# Patient Record
Sex: Male | Born: 2000 | Race: White | Hispanic: No | Marital: Single | Smoking: Former smoker
Health system: Southern US, Community
[De-identification: ages and names within clinical notes are randomized; demographics above are authoritative.]

## PROBLEM LIST (undated history)

## (undated) ENCOUNTER — Emergency Department: Discharge status: Absent without leave | Payer: Medicare Other

## (undated) DIAGNOSIS — F840 Autistic disorder: Secondary | ICD-10-CM

## (undated) DIAGNOSIS — Q8500 Neurofibromatosis, unspecified: Secondary | ICD-10-CM

## (undated) DIAGNOSIS — A3790 Whooping cough, unspecified species without pneumonia: Secondary | ICD-10-CM

## (undated) DIAGNOSIS — F32A Depression, unspecified: Secondary | ICD-10-CM

## (undated) DIAGNOSIS — F909 Attention-deficit hyperactivity disorder, unspecified type: Secondary | ICD-10-CM

## (undated) DIAGNOSIS — F329 Major depressive disorder, single episode, unspecified: Secondary | ICD-10-CM

## (undated) DIAGNOSIS — E669 Obesity, unspecified: Secondary | ICD-10-CM

## (undated) HISTORY — PX: MRI: SHX5353

---
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
## 2018-09-01 ENCOUNTER — Encounter: Payer: Self-pay | Admitting: Emergency Medicine

## 2018-09-01 ENCOUNTER — Emergency Department
Admission: EM | Admit: 2018-09-01 | Discharge: 2018-09-02 | Disposition: A | Discharge status: Home / Self Care | Payer: Medicaid Other | Attending: Student in an Organized Health Care Education/Training Program | Admitting: Student in an Organized Health Care Education/Training Program

## 2018-09-01 ENCOUNTER — Other Ambulatory Visit: Payer: Self-pay

## 2018-09-01 DIAGNOSIS — R45851 Suicidal ideations: Secondary | ICD-10-CM | POA: Insufficient documentation

## 2018-09-01 DIAGNOSIS — Z7289 Other problems related to lifestyle: Secondary | ICD-10-CM

## 2018-09-01 DIAGNOSIS — F329 Major depressive disorder, single episode, unspecified: Secondary | ICD-10-CM | POA: Diagnosis not present

## 2018-09-01 DIAGNOSIS — F840 Autistic disorder: Secondary | ICD-10-CM | POA: Insufficient documentation

## 2018-09-01 DIAGNOSIS — Y929 Unspecified place or not applicable: Secondary | ICD-10-CM | POA: Insufficient documentation

## 2018-09-01 DIAGNOSIS — Z008 Encounter for other general examination: Secondary | ICD-10-CM | POA: Insufficient documentation

## 2018-09-01 DIAGNOSIS — Z79899 Other long term (current) drug therapy: Secondary | ICD-10-CM | POA: Diagnosis not present

## 2018-09-01 DIAGNOSIS — X781XXA Intentional self-harm by knife, initial encounter: Secondary | ICD-10-CM | POA: Diagnosis not present

## 2018-09-01 DIAGNOSIS — F39 Unspecified mood [affective] disorder: Secondary | ICD-10-CM | POA: Diagnosis present

## 2018-09-01 DIAGNOSIS — F909 Attention-deficit hyperactivity disorder, unspecified type: Secondary | ICD-10-CM | POA: Diagnosis present

## 2018-09-01 DIAGNOSIS — Y9389 Activity, other specified: Secondary | ICD-10-CM | POA: Insufficient documentation

## 2018-09-01 DIAGNOSIS — S50312A Abrasion of left elbow, initial encounter: Secondary | ICD-10-CM | POA: Diagnosis not present

## 2018-09-01 DIAGNOSIS — Y999 Unspecified external cause status: Secondary | ICD-10-CM | POA: Insufficient documentation

## 2018-09-01 DIAGNOSIS — Z915 Personal history of self-harm: Secondary | ICD-10-CM | POA: Insufficient documentation

## 2018-09-01 DIAGNOSIS — Z87891 Personal history of nicotine dependence: Secondary | ICD-10-CM | POA: Diagnosis not present

## 2018-09-01 DIAGNOSIS — I10 Essential (primary) hypertension: Secondary | ICD-10-CM | POA: Insufficient documentation

## 2018-09-01 DIAGNOSIS — S59802A Other specified injuries of left elbow, initial encounter: Secondary | ICD-10-CM | POA: Diagnosis present

## 2018-09-01 DIAGNOSIS — R4689 Other symptoms and signs involving appearance and behavior: Secondary | ICD-10-CM | POA: Diagnosis present

## 2018-09-01 LAB — CBC
HCT: 46.8 % (ref 39.0–52.0)
Hemoglobin: 15.3 g/dL (ref 13.0–17.0)
MCH: 29.7 pg (ref 26.0–34.0)
MCHC: 32.7 g/dL (ref 30.0–36.0)
MCV: 90.9 fL (ref 80.0–100.0)
Platelets: 330 10*3/uL (ref 150–400)
RBC: 5.15 MIL/uL (ref 4.22–5.81)
RDW: 13.3 % (ref 11.5–15.5)
WBC: 8.2 10*3/uL (ref 4.0–10.5)
nRBC: 0 % (ref 0.0–0.2)

## 2018-09-01 LAB — URINE DRUG SCREEN, QUALITATIVE (ARMC ONLY)
Amphetamines, Ur Screen: NOT DETECTED
Barbiturates, Ur Screen: NOT DETECTED
Benzodiazepine, Ur Scrn: NOT DETECTED
Cannabinoid 50 Ng, Ur ~~LOC~~: NOT DETECTED
Cocaine Metabolite,Ur ~~LOC~~: NOT DETECTED
MDMA (ECSTASY) UR SCREEN: NOT DETECTED
Methadone Scn, Ur: NOT DETECTED
Opiate, Ur Screen: NOT DETECTED
Phencyclidine (PCP) Ur S: NOT DETECTED
Tricyclic, Ur Screen: NOT DETECTED

## 2018-09-01 LAB — ETHANOL: Alcohol, Ethyl (B): <10 mg/dL (ref <10)

## 2018-09-01 LAB — COMPREHENSIVE METABOLIC PANEL
ALT: 19 U/L (ref 0–44)
AST: 21 U/L (ref 15–41)
Albumin: 4.6 g/dL (ref 3.5–5.0)
Alkaline Phosphatase: 71 U/L (ref 38–126)
Anion gap: 10 (ref 5–15)
BUN: 16 mg/dL (ref 6–20)
CALCIUM: 9 mg/dL (ref 8.9–10.3)
CO2: 24 mmol/L (ref 22–32)
Chloride: 102 mmol/L (ref 98–111)
Creatinine, Ser: 0.75 mg/dL (ref 0.61–1.24)
GFR calc Af Amer: >60 mL/min (ref >60)
GFR calc non Af Amer: >60 mL/min (ref >60)
Glucose, Bld: 96 mg/dL (ref 70–99)
Potassium: 3.9 mmol/L (ref 3.5–5.1)
Sodium: 136 mmol/L (ref 135–145)
Total Bilirubin: 0.5 mg/dL (ref 0.3–1.2)
Total Protein: 7.9 g/dL (ref 6.5–8.1)

## 2018-09-01 LAB — ACETAMINOPHEN LEVEL: Acetaminophen (Tylenol), Serum: <10 ug/mL — ABNORMAL LOW (ref 10–30)

## 2018-09-01 LAB — SALICYLATE LEVEL: Salicylate Lvl: <7 mg/dL (ref 2.8–30.0)

## 2018-09-01 NOTE — ED Provider Notes (Signed)
[facility] Emergency Department Provider Note    First MD Initiated Contact with Patient 09/01/18 1809     (approximate)  I have reviewed the triage vital signs and the nursing notes.   HISTORY  Chief Complaint Suicidal    HPI Connor [name] is a 18 y.o. male with the below listed past medical history previous on Tegretol, Zoloft, clonidine presents to the ER for evaluation of suicidal ideation and hopelessness.  States that today's episode where he was found by police to be cutting himself with a kitchen knife and threatening to kill himself occurred after he got into an argument with his mother.  States that their relationship is very tense and that she has been threatening to throw him out of the house.  States his been compliant with his medications.  States he has trouble controlling his anger and outbursts.  denies any hallucinations or other polysubstance abuse.    Past Medical History:  Diagnosis Date  . ADHD (attention deficit hyperactivity disorder)   . Autism   . Depressed   . Neurofibromatosis (HCC)    Type 1  . Obesity   . Pertussis    as a infant   Family History  Problem Relation Age of Onset  . Anxiety disorder Mother   . Depression Mother   . OCD Mother   . Hypertension Mother   . Cancer Maternal Aunt   . Cancer Maternal Grandmother   . Hypertension Father   . Schizophrenia Maternal Uncle    Past Surgical History:  Procedure Laterality Date  . MRI    . RADIOLOGY WITH ANESTHESIA N/A 08/13/2012   Procedure: RADIOLOGY WITH ANESTHESIA;  Surgeon: Medication Radiologist, MD;  Location: [facility] OR;  Service: Radiology;  Laterality: N/A;  MRI    Patient Active Problem List   Diagnosis Date Noted  . 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 Bipolar I disorder, most recent episode depressed with anxious distress (HCC) 08/06/2017  . ADHD (attention deficit hyperactivity disorder)  07/08/2017  . Personality disorder (HCC) 07/08/2017  . Insomnia 06/09/2017  . Aggression 01/31/2016  . 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 Autism spectrum disorder associated with known medical or genetic condition or environmental factor, requiring substantial support (level 2) 11/06/2015  . 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 Authorizing Provider  doxepin (SINEQUAN) 100 MG capsule Take 1 capsule (100 mg total) by mouth at bedtime. 08/06/17   Pucilowska, Jolanta B, MD  lurasidone (LATUDA) 80 MG TABS tablet Take 1 tablet (80 mg total) by mouth daily before supper. 08/06/17   Pucilowska, Jolanta B, MD  OLANZapine (ZYPREXA) 5 MG tablet Take 1 tablet (5 mg total) by mouth daily as needed (agitation). 08/06/17   Pucilowska, Braulio Conte B, MD  sertraline (ZOLOFT) 100 MG tablet Take 2 tablets (200 mg total) by mouth daily. 08/06/17   Pucilowska, Ellin Goodie, MD    Allergies Patient has no known allergies.    Social History Social History   Tobacco Use  . Smoking status: Former Smoker    Packs/day: 1.00    Years: 1.00    Pack years: 1.00    Types: Cigarettes    Last attempt to quit: 12/31/2014    Years since quitting: 3.6  . Smokeless tobacco: Never Used  Substance Use Topics  .  Alcohol use: No    Alcohol/week: 0.0 standard drinks    Frequency: Never  . Drug use: No    Review of Systems Patient denies headaches, rhinorrhea, blurry vision, numbness, shortness of breath, chest pain, edema, cough, abdominal pain, nausea, vomiting, diarrhea, dysuria, fevers, rashes or hallucinations unless otherwise stated above in HPI. ____________________________________________   PHYSICAL EXAM:  VITAL SIGNS: Vitals:   09/01/18 1745  BP: 137/82  Pulse: 80  Resp: 20  Temp: 98.2 F (36.8 C)  SpO2: 97%     Constitutional: Alert and oriented.  Eyes: Conjunctivae are normal.  Head: Atraumatic. Nose: No congestion/rhinnorhea. Mouth/Throat: Mucous membranes are moist.   Neck: No stridor. Painless ROM.  Cardiovascular: Normal rate, regular rhythm. Grossly normal heart sounds.  Good peripheral circulation. Respiratory: Normal respiratory effort.  No retractions. Lungs CTAB. Gastrointestinal: Soft and nontender. No distention. No abdominal bruits. No CVA tenderness. Genitourinary:  Musculoskeletal: No lower extremity tenderness nor edema.  No joint effusions. Neurologic:  Normal speech and language. No gross focal neurologic deficits are appreciated. No facial droop Skin:  Skin is warm, dry and intact.  Superficial abrasions to the left lateral elbow region Psychiatric: Mood and affect are normal. Speech and behavior are normal.  ____________________________________________   LABS (all labs ordered are listed, but only abnormal results are displayed)  Results for orders placed or performed during the hospital encounter of 09/01/18 (from the past 24 hour(s))  Comprehensive metabolic panel     Status: None   Collection Time: 09/01/18  5:52 PM  Result Value Ref Range   Sodium 136 135 - 145 mmol/L   Potassium 3.9 3.5 - 5.1 mmol/L   Chloride 102 98 - 111 mmol/L   CO2 24 22 - 32 mmol/L   Glucose, Bld 96 70 - 99 mg/dL   BUN 16 6 - 20 mg/dL   Creatinine, Ser 0.62 0.61 - 1.24 mg/dL   Calcium 9.0 8.9 - 37.6 mg/dL   Total Protein 7.9 6.5 - 8.1 g/dL   Albumin 4.6 3.5 - 5.0 g/dL   AST 21 15 - 41 U/L   ALT 19 0 - 44 U/L   Alkaline Phosphatase 71 38 - 126 U/L   Total Bilirubin 0.5 0.3 - 1.2 mg/dL   GFR calc non Af Amer >60 >60 mL/min   GFR calc Af Amer >60 >60 mL/min   Anion gap 10 5 - 15  Ethanol     Status: None   Collection Time: 09/01/18  5:52 PM  Result Value Ref Range   Alcohol, Ethyl (B) <10 <10 mg/dL  Salicylate level     Status: None   Collection Time: 09/01/18  5:52 PM  Result  Value Ref Range   Salicylate Lvl <7.0 2.8 - 30.0 mg/dL  Acetaminophen level     Status: Abnormal   Collection Time: 09/01/18  5:52 PM  Result Value Ref Range   Acetaminophen (Tylenol), Serum <10 (L) 10 - 30 ug/mL  cbc     Status: None   Collection Time: 09/01/18  5:52 PM  Result Value Ref Range   WBC 8.2 4.0 - 10.5 K/uL   RBC 5.15 4.22 - 5.81 MIL/uL   Hemoglobin 15.3 13.0 - 17.0 g/dL   HCT 28.3 15.1 - 76.1 %   MCV 90.9 80.0 - 100.0 fL   MCH 29.7 26.0 - 34.0 pg   MCHC 32.7 30.0 - 36.0 g/dL   RDW 60.7 37.1 - 06.2 %   Platelets 330 150 - 400 K/uL  nRBC 0.0 0.0 - 0.2 %   ____________________________________________ ____________________________________________  RADIOLOGY   ____________________________________________   PROCEDURES  Procedure(s) performed:  Procedures    Critical Care performed: no ____________________________________________   INITIAL IMPRESSION / ASSESSMENT AND PLAN / ED COURSE  Pertinent labs & imaging results that were available during my care of the patient were reviewed by me and considered in my medical decision making (see chart for details).   DDX: Psychosis, delirium, medication effect, noncompliance, polysubstance abuse, Si, Hi, depression   Connor [name] is a 17 y.o. who presents to the ED with for evaluation of si.  Patient has psych history of autism and self reported depression.  Laboratory testing was ordered to evaluation for underlying electrolyte derangement or signs of underlying organic pathology to explain today's presentation.  Based on history and physical and laboratory evaluation, it appears that the patient's presentation is 2/2 underlying psychiatric disorder and will require further evaluation and management by inpatient psychiatry.  Patient was made an IVC due to SI and cutting behavior.  Disposition pending psychiatric evaluation.       As part of my medical decision making, I reviewed the following data  within the electronic MEDICAL RECORD NUMBER Nursing notes reviewed and incorporated, Labs reviewed, notes from prior ED visits.   ____________________________________________   FINAL CLINICAL IMPRESSION(S) / ED DIAGNOSES  Final diagnoses:  Suicidal ideation  Deliberate self-cutting      NEW MEDICATIONS STARTED DURING THIS VISIT:  New Prescriptions   No medications on file     Note:  This document was prepared using Dragon voice recognition software and may include unintentional dictation errors.    Willy Eddy, MD 09/01/18 Paulo Fruit

## 2018-09-01 NOTE — ED Triage Notes (Signed)
Here for SI. Pt voluntary with sheriff.  Abrasions to LUE from steak knife.  Pt reports he and his mother have not been getting along and his mom has been threatening to send him to a homeless shelter.  Cooperative.

## 2018-09-01 NOTE — ED Notes (Signed)
Hourly rounding reveals patient in room. No complaints, stable, in no acute distress. Q15 minute rounds and monitoring via Security Cameras to continue. 

## 2018-09-01 NOTE — ED Notes (Signed)
Pt. Transferred to BHU from ED to room after screening for contraband. Report to include Situation, Background, Assessment and Recommendations from [facility]. Pt. Oriented to unit including Q15 minute rounds as well as the security cameras for their protection. Patient is alert and oriented, warm and dry in no acute distress. Patient denies, HI, and AVH. Patient reported SI with the plan to cut his wrist. He has superficial cut on his left forearm.  Pt. Encouraged to let me know if needs arise.

## 2018-09-01 NOTE — BH Assessment (Signed)
Assessment Note  Connor [name] is an 18 y.o. male. Connor [name] arrived to the ED by way of law enforcement.   He reports that "my mom is freaking out and she called the police. She was in a room , locked the door, and kind of panicking and stuff and threatened to take me to a homeless shelter.  I started crying and I started freaking out and yelling and stuff.  She told me to pack my stuff and I was not okay with it.  I started kicking and punching the door, and then I ran to the kitchen and tried to cut myself.  I don't know why, I just freaked out'.  He states "I don't want to go to a homeless shelter, It was unexpected". He states that he isolates himself.  "I feel like I have been a wreck lately".  He states that he is unclear about everything.  He reports having sleeping problems, and his medications are not effective. He reports a history of anxiety, and is currently "a little bit" anxious. He denied having auditory or visual hallucinations.   He denied homicidal ideation or intent.  He denied suicidal ideation or intent.  He states, "I just want a better life". 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 No one answered the call.  A message was left  Diagnosis: Depressed  Past Medical History:  Past Medical History:  Diagnosis Date  . ADHD (attention deficit hyperactivity disorder)   . Autism   . Depressed   . Neurofibromatosis (HCC)    Type 1  . Obesity   . Pertussis    as a infant    Past Surgical History:  Procedure Laterality Date  . MRI    . RADIOLOGY WITH ANESTHESIA N/A 08/13/2012   Procedure: RADIOLOGY WITH ANESTHESIA;  Surgeon: Medication Radiologist, MD;  Location: [facility] OR;  Service: Radiology;  Laterality: N/A;  MRI     Family History:  Family History  Problem Relation Age of Onset  . Anxiety disorder Mother   . Depression Mother   . OCD Mother   . Hypertension Mother    . Cancer Maternal Aunt   . Cancer Maternal Grandmother   . Hypertension Father   . Schizophrenia Maternal Uncle     Social History:  reports that he quit smoking about 3 years ago. His smoking use included cigarettes. He has a 1.00 pack-year smoking history. He has never used smokeless tobacco. He reports that he does not drink alcohol or use drugs.  Additional Social History:  Alcohol / Drug Use History of alcohol / drug use?: No history of alcohol / drug abuse  CIWA: CIWA-Ar BP: 129/76 Pulse Rate: 76 COWS:    Allergies: No Known Allergies  Home Medications: (Not in a hospital admission)   OB/GYN Status:  No LMP for male patient.  General Assessment Data Location of Assessment: [facility] ED TTS Assessment: In system Is this a Tele or Face-to-Face Assessment?: Face-to-Face Is this an Initial Assessment or a Re-assessment for this encounter?: Initial Assessment Patient Accompanied by:: N/A Language Other than English: No Living Arrangements: Other (Comment)(Private residence) What gender do you identify as?: Male Marital status: Single Living Arrangements: Parent Can pt return to current living arrangement?: Yes Admission Status: Involuntary Petitioner: ED Attending Is patient capable of signing voluntary admission?: No Referral Source: Self/Family/Friend Insurance type: Medicaid  Medical Screening Exam [facility] Walk-in ONLY) Medical  Exam completed: Yes  Crisis Care Plan Living Arrangements: Parent Legal Guardian: Other:(Self) Name of Psychiatrist: [facility] Name of Therapist: [facility]  Education Status Is patient currently in school?: No Is the patient employed, unemployed or receiving disability?: Unemployed  Risk to self with the past 6 months Suicidal Ideation: No Has patient been a risk to self within the past 6 months prior to admission? : No Suicidal Intent: No Has patient had any suicidal intent within the past 6  months prior to admission? : No Is patient at risk for suicide?: No Suicidal Plan?: No Has patient had any suicidal plan within the past 6 months prior to admission? : No Access to Means: No What has been your use of drugs/alcohol within the last 12 months?: Denied use of substances Previous Attempts/Gestures: Yes How many times?: 1 Other Self Harm Risks: denied Triggers for Past Attempts: Other (Comment)(Overwhelmed) Intentional Self Injurious Behavior: None Family Suicide History: No Recent stressful life event(s): Conflict (Comment)(fighting with mother) Persecutory voices/beliefs?: No Depression: No Depression Symptoms: (denied) Substance abuse history and/or treatment for substance abuse?: No Suicide prevention information given to non-admitted patients: Not applicable  Risk to Others within the past 6 months Homicidal Ideation: No Does patient have any lifetime risk of violence toward others beyond the six months prior to admission? : No Thoughts of Harm to Others: No Current Homicidal Intent: No Current Homicidal Plan: No Access to Homicidal Means: No Identified Victim: None identified History of harm to others?: No Assessment of Violence: None Noted Violent Behavior Description: denied Does patient have access to weapons?: Yes (Comment)(Access to kitchen knives) Criminal Charges Pending?: No Does patient have a court date: No Is patient on probation?: No  Psychosis Hallucinations: None noted Delusions: None noted  Mental Status Report Appearance/Hygiene: In scrubs Eye Contact: Fair Motor Activity: Unremarkable Speech: Logical/coherent Level of Consciousness: Alert Mood: Euthymic Affect: Appropriate to circumstance Anxiety Level: None Thought Processes: Coherent Judgement: Partial Orientation: Appropriate for developmental age Obsessive Compulsive Thoughts/Behaviors: Unable to Assess  Cognitive Functioning Concentration: Normal Memory: Recent Intact Is  patient IDD: No Insight: Fair Impulse Control: Fair Appetite: Good Have you had any weight changes? : No Change Sleep: Decreased Vegetative Symptoms: None  ADLScreening [facility] Assessment Services) Patient's cognitive ability adequate to safely complete daily activities?: Yes Patient able to express need for assistance with ADLs?: Yes Independently performs ADLs?: Yes (appropriate for developmental age)  Prior Inpatient Therapy Prior Inpatient Therapy: Yes Prior Therapy Dates: 2019 and prior Prior Therapy Facilty/Provider(s): [facility] Reason for Treatment: Anger outbursts  Prior Outpatient Therapy Prior Outpatient Therapy: Yes Prior Therapy Dates: Current Prior Therapy Facilty/Provider(s): [facility] Reason for Treatment: Unsure Does patient have an ACCT team?: No Does patient have Intensive In-House Services?  : No Does patient have [facility] services? : No Does patient have P4CC services?: No  ADL Screening (condition at time of admission) Patient's cognitive ability adequate to safely complete daily activities?: Yes Is the patient deaf or have difficulty hearing?: No Does the patient have difficulty seeing, even when wearing glasses/contacts?: No Does the patient have difficulty concentrating, remembering, or making decisions?: No Patient able to express need for assistance with ADLs?: Yes Does the patient have difficulty dressing or bathing?: No Independently performs ADLs?: Yes (appropriate for developmental age) Does the patient have difficulty walking or climbing stairs?: No Weakness of Legs: None Weakness of Arms/Hands: None  Home Assistive Devices/Equipment Home Assistive Devices/Equipment: None    Abuse/Neglect Assessment (Assessment to be  complete while patient is alone) Abuse/Neglect Assessment Can Be Completed: (Patient denied a history of abuse)     Advance Directives (For Healthcare) Does Patient Have a Medical Advance Directive?: No           Disposition:  Disposition Initial Assessment Completed for this Encounter: Yes  On Site Evaluation by:   Reviewed with Physician:    Justice Deeds 09/01/2018 9:32 PM

## 2018-09-01 NOTE — ED Notes (Addendum)
Pt dressed out into appropriate behavioral health clothing with this tech, Lisa,EDT and [employer] from [place] in the rm. Pt belongings consist of a [employer] sweatshirt, white tennis shoes, white socks, gray t-shirt, blue shorts and blue boxers. Pt calm and cooperative while dressing out. Pt has one bag of belongings.

## 2018-09-01 NOTE — ED Notes (Signed)

## 2018-09-02 DIAGNOSIS — R4689 Other symptoms and signs involving appearance and behavior: Secondary | ICD-10-CM | POA: Diagnosis not present

## 2018-09-02 MED ORDER — HYDROXYZINE HCL 25 MG PO TABS
50.0000 mg | ORAL_TABLET | Freq: Once | ORAL | Status: AC
  Administered 2018-09-02: 50 mg via ORAL
  Filled 2018-09-02: qty 2

## 2018-09-02 MED ORDER — CARBAMAZEPINE 200 MG PO TABS: 400.0000 mg | ORAL_TABLET | Freq: Every day | ORAL | Status: DC

## 2018-09-02 MED ORDER — SERTRALINE HCL 100 MG PO TABS: 200.0000 mg | ORAL_TABLET | Freq: Every day | ORAL | Status: DC

## 2018-09-02 MED ORDER — CARBAMAZEPINE 200 MG PO TABS
200.0000 mg | ORAL_TABLET | Freq: Two times a day (BID) | ORAL | Status: DC
  Administered 2018-09-02: 200 mg via ORAL
  Filled 2018-09-02: qty 1

## 2018-09-02 MED ORDER — SERTRALINE HCL 50 MG PO TABS: 150.0000 mg | ORAL_TABLET | Freq: Every day | ORAL | Status: DC

## 2018-09-02 MED ORDER — DIVALPROEX SODIUM 500 MG PO DR TAB: 1000.0000 mg | DELAYED_RELEASE_TABLET | Freq: Every day | ORAL | Status: DC

## 2018-09-02 MED ORDER — DIVALPROEX SODIUM 500 MG PO DR TAB: 500.0000 mg | DELAYED_RELEASE_TABLET | ORAL | Status: DC

## 2018-09-02 NOTE — ED Notes (Signed)
Hourly rounding reveals patient in room. No complaints, stable, in no acute distress. Q15 minute rounds and monitoring via Security Cameras to continue. 

## 2018-09-02 NOTE — ED Notes (Signed)
Food tray given 

## 2018-09-02 NOTE — BH Assessment (Signed)
Pt's mother Ranardo Mutch: [phone]) reports she will p/u pt at 3:30pm. Mom states she is currently at a doctor's appt in [place], [place].

## 2018-09-02 NOTE — ED Notes (Signed)
The patient had a relative call and demand to speak with the nurse. I told her she was in another room with a patient and she then with a stern voice demanded to speak now to the nurse. Again, I told her she was with another patient. I placed her on hold and she then hung up after about 10 seconds after being on hold.

## 2018-09-02 NOTE — ED Provider Notes (Signed)
-----------------------------------------   6:56 AM on 09/02/2018 -----------------------------------------   Blood pressure 129/76, pulse 76, temperature 98.9 F (37.2 C), temperature source Oral, resp. rate 17, height 6\' 3"  (1.905 m), weight 126.1 kg, SpO2 99 %.  The patient is calm and cooperative at this time.  There have been no acute events since the last update.  Awaiting disposition plan from Behavioral Medicine team.    Arnaldo Natal, MD 09/02/18 [phone]

## 2018-09-02 NOTE — ED Notes (Addendum)
Patient's mother, Ernie Halas [phone], called requesting to speak with psychiatrist.  Pt's mother was tearful and stating that her son is in this shape because [facility] prescribed him Clonidine and took away his calming medications.  Mother requests that patient calls her when patient phone becomes available.

## 2018-09-02 NOTE — Consult Note (Signed)
[facility] Face-to-Face Psychiatry Consult   Reason for Consult:  Aggression Referring Physician:  Dr. Roxan Hockey Patient Identification: Connor [name] MRN:  [id number] Principal Diagnosis: Aggression Patient Active Problem List   Diagnosis Date Noted  . 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 Bipolar I disorder, most recent episode depressed with anxious distress (HCC) 08/06/2017  . ADHD (attention deficit hyperactivity disorder) 07/08/2017  . Personality disorder (HCC) 07/08/2017  . Insomnia 06/09/2017  . Aggression 01/31/2016  . 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 Autism spectrum disorder associated with known medical or genetic condition or environmental factor, requiring substantial support (level 2) 11/06/2015  . 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 Clinical von Recklinghausen's disease (HCC) 11/06/2015   Patient is seen chart is reviewed..  Agree with below recommendations with addition: Patient reports that he is on probation from assault on his mother.  He just was released from jail where he stayed from August 2019 till July 20, 2018.  He reports he has 2 counts of felony and 1 misdemeanor charge.  Patient states that he has not been working since he was released from jail.  Patient has a long history of behavior control, and states he dropped out of high school in his senior year due to becoming agitated and aggressive at school.  Patient states that he has been living with his mom and says that "it pisses me off when she is scared of me.  She locked herself in her room, and I was banging on the door so I can talk to her and she called the cops."  Patient states that he believes that the medication change that he had has been disruptive to his mood.  He also notes that when he is in prison medications were changed.  He is hopeful that he can return home with prior medications back in place  so that he can control his anger.  He does not desire to go back to prison.  Patient is denying SI, HI, AVH. Mother is contracting for safety.  Patient is able to contract for safety.  Recommend reversal of involuntary commitment.  Patient has outpatient appointment with regular psychiatrist on 09/03/2018. Tegretol has been restarted.  Total Time spent with patient: 40 minutes  Subjective: " My mom and I got into a fight and she threatened to take me to a shelter."  "I get angry and started to kick her room to." Connor [name] is a 18 y.o. male patient presented to [facility] ED via law enforcement under involuntary commitment status (IVC). The patient was seen face-to-face by this provider; chart reviewed and consulted with Dr.  Roxan Hockey on 09/01/2018 due to the care of the patient. It was discussed with Dr. Roxan Hockey that the patient does not meet criteria to be admitted to the inpatient unit.  The patient denies suicidal, homicidal ideation, and self-injurious behavior. The patient admit that what he did was "stupid" and he is sorry and ashamed of his behavior. The patient is currently receiving outpatient psychiatric care with [facility] and sees Dr. Daleen Squibb.  He also has anger management counselor that he sees  Kennith Maes in [place].  On evaluation the patient is alert and oriented x4, anxious but cooperative with depressed mood due to his recent altercation with his mom.   The patient does not appear to be responding to internal or external stimuli. Neither is the patient presenting with any delusional thinking. The patient  denies any suicidal, homicidal, or self-harm ideations. The patient is not presenting with any psychotic or paranoid behaviors. During an encounter with the patient, he was able to answer questions appropriately. The patient did voice that he was recently released from jail due to him assaulting his mother.  He stated he has been 6 months in jail for assault on the  male.  Per TTS Counselor Ms. Sloane; He reports that "my mom is freaking out and she called the police. She was in a room , locked the door, and kind of panicking and stuff and threatened to take me to a homeless shelter.  I started crying and I started freaking out and yelling and stuff.  She told me to pack my stuff and I was not okay with it.  I started kicking and punching the door, and then I ran to the kitchen and tried to cut myself.  I don't know why, I just freaked out'.  He states "I don't want to go to a homeless shelter, It was unexpected". He states that he isolates himself.  "I feel like I have been a wreck lately".  He states that he is unclear about everything.  Collateral: TTS attempted to contact mother Cruiz Imperial at - [phone]). No one answered the call.  A message was left.  HPI: Per Dr. Roxan Hockey; Connor [name] is a 18 y.o. male with the below listed past medical history previous on Tegretol, Zoloft, clonidine presents to the ER for evaluation of suicidal ideation and hopelessness.  States that today's episode where he was found by police to be cutting himself with a kitchen knife and threatening to kill himself occurred after he got into an argument with his mother.  States that their relationship is very tense and that she has been threatening to throw him out of the house.  States his been compliant with his medications.  States he has trouble controlling his anger and outbursts.  denies any hallucinations or other polysubstance abuse.  Past Psychiatric History:  ADHD (attention deficit hyperactivity disorder) Autism Depressed  Risk to Self: Suicidal Ideation: No Suicidal Intent: No Is patient at risk for suicide?: No Suicidal Plan?: No Access to Means: No What has been your use of drugs/alcohol within the last 12 months?: Denied use of substances How many times?: 1 Other Self Harm Risks: denied Triggers for Past Attempts: Other  (Comment)(Overwhelmed) Intentional Self Injurious Behavior: None Risk to Others: Homicidal Ideation: No Thoughts of Harm to Others: No Current Homicidal Intent: No Current Homicidal Plan: No Access to Homicidal Means: No Identified Victim: None identified History of harm to others?: No Assessment of Violence: None Noted Violent Behavior Description: denied Does patient have access to weapons?: Yes (Comment)(Access to kitchen knives) Criminal Charges Pending?: No Does patient have a court date: No Prior Inpatient Therapy: Prior Inpatient Therapy: Yes Prior Therapy Dates: 2019 and prior Prior Therapy Facilty/Provider(s): [facility] Reason for Treatment: Anger outbursts Prior Outpatient Therapy: Prior Outpatient Therapy: Yes Prior Therapy Dates: Current Prior Therapy Facilty/Provider(s): [facility] Reason for Treatment: Unsure Does patient have an ACCT team?: No Does patient have Intensive In-House Services?  : No Does patient have [facility] services? : No Does patient have P4CC services?: No  Past Medical History:  Past Medical History:  Diagnosis Date  . ADHD (attention deficit hyperactivity disorder)   . Autism   . Depressed   . Neurofibromatosis (HCC)    Type 1  . Obesity   . Pertussis  as a infant    Past Surgical History:  Procedure Laterality Date  . MRI    . RADIOLOGY WITH ANESTHESIA N/A 08/13/2012   Procedure: RADIOLOGY WITH ANESTHESIA;  Surgeon: Medication Radiologist, MD;  Location: [facility] OR;  Service: Radiology;  Laterality: N/A;  MRI    Family History:  Family History  Problem Relation Age of Onset  . Anxiety disorder Mother   . Depression Mother   . OCD Mother   . Hypertension Mother   . Cancer Maternal Aunt   . Cancer Maternal Grandmother   . Hypertension Father   . Schizophrenia Maternal Uncle    Family Psychiatric  History:  Anxiety disorder Depression OCD  Social History:  Social History   Substance and Sexual Activity  Alcohol Use  No  . Alcohol/week: 0.0 standard drinks  . Frequency: Never     Social History   Substance and Sexual Activity  Drug Use No    Social History   Socioeconomic History  . Marital status: Single    Spouse name: Not on file  . Number of children: Not on file  . Years of education: Not on file  . Highest education level: Not on file  Occupational History  . Not on file  Social Needs  . Financial resource strain: Not on file  . Food insecurity:    Worry: Not on file    Inability: Not on file  . Transportation needs:    Medical: Not on file    Non-medical: Not on file  Tobacco Use  . Smoking status: Former Smoker    Packs/day: 1.00    Years: 1.00    Pack years: 1.00    Types: Cigarettes    Last attempt to quit: 12/31/2014    Years since quitting: 3.6  . Smokeless tobacco: Never Used  Substance and Sexual Activity  . Alcohol use: No    Alcohol/week: 0.0 standard drinks    Frequency: Never  . Drug use: No  . Sexual activity: Not Currently    Birth control/protection: None  Lifestyle  . Physical activity:    Days per week: Not on file    Minutes per session: Not on file  . Stress: Not on file  Relationships  . Social connections:    Talks on phone: Not on file    Gets together: Not on file    Attends religious service: Not on file    Active member of club or organization: Not on file    Attends meetings of clubs or organizations: Not on file    Relationship status: Not on file  Other Topics Concern  . Not on file  Social History Narrative   Dencil is a high school drop out.   He lives with his mom only. He has one sister.   He enjoys eating, sleeping, and watching tv.   Additional Social History:    Allergies:  No Known Allergies  Labs:  Results for orders placed or performed during the hospital encounter of 09/01/18 (from the past 48 hour(s))  Comprehensive metabolic panel     Status: None   Collection Time: 09/01/18  5:52 PM  Result Value Ref Range    Sodium 136 135 - 145 mmol/L   Potassium 3.9 3.5 - 5.1 mmol/L   Chloride 102 98 - 111 mmol/L   CO2 24 22 - 32 mmol/L   Glucose, Bld 96 70 - 99 mg/dL   BUN 16 6 - 20 mg/dL   Creatinine, Ser 1.19 0.61 -  1.24 mg/dL   Calcium 9.0 8.9 - 11.9 mg/dL   Total Protein 7.9 6.5 - 8.1 g/dL   Albumin 4.6 3.5 - 5.0 g/dL   AST 21 15 - 41 U/L   ALT 19 0 - 44 U/L   Alkaline Phosphatase 71 38 - 126 U/L   Total Bilirubin 0.5 0.3 - 1.2 mg/dL   GFR calc non Af Amer >60 >60 mL/min   GFR calc Af Amer >60 >60 mL/min   Anion gap 10 5 - 15    Comment: Performed at [facility], [address], [place], [place] [address]  Ethanol     Status: None   Collection Time: 09/01/18  5:52 PM  Result Value Ref Range   Alcohol, Ethyl (B) <10 <10 mg/dL    Comment: (NOTE) Lowest detectable limit for serum alcohol is 10 mg/dL. For medical purposes only. Performed at [facility], [address] [place], [place] [address]   Salicylate level     Status: None   Collection Time: 09/01/18  5:52 PM  Result Value Ref Range   Salicylate Lvl <7.0 2.8 - 30.0 mg/dL    Comment: Performed at [facility], [address] [place], [place] [address]  Acetaminophen level     Status: Abnormal   Collection Time: 09/01/18  5:52 PM  Result Value Ref Range   Acetaminophen (Tylenol), Serum <10 (L) 10 - 30 ug/mL    Comment: (NOTE) Therapeutic concentrations vary significantly. A range of 10-30 ug/mL  may be an effective concentration for many patients. However, some  are best treated at concentrations outside of this range. Acetaminophen concentrations >150 ug/mL at 4 hours after ingestion  and >50 ug/mL at 12 hours after ingestion are often associated with  toxic reactions. Performed at [facility], [address] [place], [place] [address]   cbc     Status: None   Collection Time: 09/01/18  5:52 PM  Result Value Ref Range   WBC 8.2 4.0 - 10.5 K/uL   RBC 5.15 4.22 - 5.81 MIL/uL    Hemoglobin 15.3 13.0 - 17.0 g/dL   HCT 62.9 52.8 - 41.3 %   MCV 90.9 80.0 - 100.0 fL   MCH 29.7 26.0 - 34.0 pg   MCHC 32.7 30.0 - 36.0 g/dL   RDW 24.4 01.0 - 27.2 %   Platelets 330 150 - 400 K/uL   nRBC 0.0 0.0 - 0.2 %    Comment: Performed at [facility], [address] [place], [place] [address]  Urine Drug Screen, Qualitative     Status: None   Collection Time: 09/01/18  5:52 PM  Result Value Ref Range   Tricyclic, Ur Screen NONE DETECTED NONE DETECTED   Amphetamines, Ur Screen NONE DETECTED NONE DETECTED   MDMA (Ecstasy)Ur Screen NONE DETECTED NONE DETECTED   Cocaine Metabolite,Ur [place] NONE DETECTED NONE DETECTED   Opiate, Ur Screen NONE DETECTED NONE DETECTED   Phencyclidine (PCP) Ur S NONE DETECTED NONE DETECTED   Cannabinoid 50 Ng, Ur [place] NONE DETECTED NONE DETECTED   Barbiturates, Ur Screen NONE DETECTED NONE DETECTED   Benzodiazepine, Ur Scrn NONE DETECTED NONE DETECTED   Methadone Scn, Ur NONE DETECTED NONE DETECTED    Comment: (NOTE) Tricyclics + metabolites, urine    Cutoff 1000 ng/mL Amphetamines + metabolites, urine  Cutoff 1000 ng/mL MDMA (Ecstasy), urine              Cutoff 500 ng/mL Cocaine Metabolite, urine  Cutoff 300 ng/mL Opiate + metabolites, urine        Cutoff 300 ng/mL Phencyclidine (PCP), urine         Cutoff 25 ng/mL Cannabinoid, urine                 Cutoff 50 ng/mL Barbiturates + metabolites, urine  Cutoff 200 ng/mL Benzodiazepine, urine              Cutoff 200 ng/mL Methadone, urine                   Cutoff 300 ng/mL The urine drug screen provides only a preliminary, unconfirmed analytical test result and should not be used for non-medical purposes. Clinical consideration and professional judgment should be applied to any positive drug screen result due to possible interfering substances. A more specific alternate chemical method must be used in order to obtain a confirmed analytical result. Gas chromatography / mass spectrometry  (GC/MS) is the preferred confirmat ory method. Performed at [facility], [address] [place], [place] [address]     Current Facility-Administered Medications  Medication Dose Route Frequency Provider Last Rate Last Dose  . carbamazepine (TEGRETOL) tablet 200 mg  200 mg Oral BID Mariel CraftMaurer, Sheila M, MD      . carbamazepine (TEGRETOL) tablet 400 mg  400 mg Oral QHS Mariel CraftMaurer, Sheila M, MD      . sertraline (ZOLOFT) tablet 200 mg  200 mg Oral Daily Mariel CraftMaurer, Sheila M, MD       Current Outpatient Medications  Medication Sig Dispense Refill  . doxepin (SINEQUAN) 100 MG capsule Take 1 capsule (100 mg total) by mouth at bedtime. 30 capsule 1  . lurasidone (LATUDA) 80 MG TABS tablet Take 1 tablet (80 mg total) by mouth daily before supper. 30 tablet 1  . OLANZapine (ZYPREXA) 5 MG tablet Take 1 tablet (5 mg total) by mouth daily as needed (agitation). 10 tablet 1  . sertraline (ZOLOFT) 100 MG tablet Take 2 tablets (200 mg total) by mouth daily. 60 tablet 1    Musculoskeletal: Strength & Muscle Tone: within normal limits Gait & Station: normal Patient leans: N/A  Psychiatric Specialty Exam: Physical Exam  Constitutional: He is oriented to person, place, and time. He appears well-developed and well-nourished.  HENT:  Head: Normocephalic and atraumatic.  Right Ear: External ear normal.  Left Ear: External ear normal.  Eyes: Pupils are equal, round, and reactive to light. Conjunctivae and EOM are normal.  Neck: Normal range of motion. Neck supple.  Cardiovascular: Normal rate and regular rhythm.  Respiratory: Effort normal and breath sounds normal.  Musculoskeletal: Normal range of motion.  Neurological: He is alert and oriented to person, place, and time. He has normal reflexes.  Skin: Skin is warm and dry.    Review of Systems  Constitutional: Negative.   HENT: Negative.   Eyes: Negative.   Respiratory: Negative.   Cardiovascular: Negative.   Musculoskeletal: Negative.    Skin: Negative.   Neurological: Negative.   Endo/Heme/Allergies: Negative.   Psychiatric/Behavioral: Positive for depression. Negative for hallucinations, memory loss, substance abuse and suicidal ideas. The patient is not nervous/anxious and does not have insomnia.     Blood pressure 129/76, pulse 76, temperature 98.9 F (37.2 C), temperature source Oral, resp. rate 17, height 6\' 3"  (1.905 m), weight 126.1 kg, SpO2 99 %.Body mass index is 34.75 kg/m.  General Appearance: Fairly Groomed  Eye Contact:  Fair  Speech:  Clear and Coherent  Volume:  Normal  Mood:  Depressed  Affect:  Depressed  Thought Process:  Coherent  Orientation:  Full (Time, Place, and Person)  Thought Content:  Logical  Suicidal Thoughts:  No  Homicidal Thoughts:  No  Memory:  Recent;   Good  Judgement:  Fair  Insight:  Fair  Psychomotor Activity:  Normal  Concentration:  Concentration: Fair  Recall:  Good  Fund of Knowledge:  Fair  Language:  Good  Akathisia:  NA  Handed:  Right  AIMS (if indicated):     Assets:  Desire for Improvement Financial Resources/Insurance Housing Vocational/Educational  ADL's:  Intact  Cognition:  WNL  Sleep:   Not great without my medication     Treatment Plan Summary: Daily contact with patient to assess and evaluate symptoms and progress in treatment and Medication management  Restart Tegretol 200 mg morning and afternoon and 400 mg at night. Continue Zoloft 250 mg daily with cross taper to Pristiq.  Disposition: No evidence of imminent risk to self or others at present.   Patient does not meet criteria for psychiatric inpatient admission. Supportive therapy provided about ongoing stressors. Discussed crisis plan, support from social network, calling 911, coming to the Emergency Department, and calling Suicide Hotline.  Rescind IVC.  Patient and mother are able to contract for safety.  He was able to engage in safety planning including plan to return to nearest  emergency department or contact emergency services if he feels unable to maintain his own safety or the safety of others. Patient had no further questions, comments, or concerns. Discharge into care of mother, who agrees to maintain patient safety.       Mariel Craft, MD 09/02/2018 1:27 PM

## 2018-09-02 NOTE — ED Notes (Signed)
Pt alert and oriented x 4.  Pt denies SI, HI, and A/V hallucinations.  Contracts for safety on the unit.  Discharged to the lobby to be picked up by his mother.

## 2018-09-02 NOTE — ED Provider Notes (Signed)
Cleared for discharge by Dr. Rebecca Eaton of psychiatry   Jene Every, MD 09/02/18 1534
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
## 2018-09-02 NOTE — Progress Notes (Signed)
Per this provider previous note; Connor [name] is a 18 y.o. male patient presented to [facility] ED via law enforcement under involuntary commitment status (IVC). The patient was seen face-to-face by this provider; chart reviewed and consulted with Dr. Roxan Hockey on 09/01/2018 due to the care of the patient.It was discussed with Dr. Roxan Hockey that the patient does not meet criteria to be admitted to the inpatient unit.  Collateral: TTS attempted to contact mother Connor [name] at - [phone]). No one answered the call. A message was left.  Collateral was obtained from the patient mom this AM. She called exteremly upset this morning.  She stated that the patient seeks care at [facility].  Mom states the patient anger issues stemps from him having autism and has been very depressed for many years.  She strongly believe that clonidine 0.1 mg twice daily has caused the patient to be extremely angry.  She states that she does not want him  prescribed clonidine.  She strongly believe that the clonidine has made her son behavior what it has been. Mom wants him prescribed Tegretol 200 mg in the morning, 200 mg in the afternoon around 2 PM and 400 mg at bedtime.  She states he was prescribed Tegretol and had no issues with the medication.  Mom expressed that once the patient was taken off Tegretol he began acting very aggressive, violent and extremely angry. Mom stated 2 weeks ago to the day the patient clonidine was increased 0.1 mg twice daily.  And it has not been working well for him.  Mom wants the patient to remain on Zoloft. She said he was taking  Zoloft 200 mg daily, but currently his psychiatrist decreased his Zoloft to 150 mg daily.  Currently, he was prescribed Pristiq but unsure of the dosage. The patient mother wants him discharge due to him having an appointment with his psychiatrist on Thursday September 02, 2018.

## 2020-07-05 ENCOUNTER — Ambulatory Visit: Payer: Medicaid Other | Admitting: Gastroenterology

## 2020-07-06 ENCOUNTER — Emergency Department: Payer: Medicare Other

## 2020-07-06 ENCOUNTER — Other Ambulatory Visit: Payer: Self-pay

## 2020-07-06 DIAGNOSIS — Z87891 Personal history of nicotine dependence: Secondary | ICD-10-CM | POA: Insufficient documentation

## 2020-07-06 DIAGNOSIS — F909 Attention-deficit hyperactivity disorder, unspecified type: Secondary | ICD-10-CM | POA: Insufficient documentation

## 2020-07-06 DIAGNOSIS — F3130 Bipolar disorder, current episode depressed, mild or moderate severity, unspecified: Secondary | ICD-10-CM | POA: Insufficient documentation

## 2020-07-06 DIAGNOSIS — R0602 Shortness of breath: Secondary | ICD-10-CM | POA: Diagnosis not present

## 2020-07-06 DIAGNOSIS — Z20822 Contact with and (suspected) exposure to covid-19: Secondary | ICD-10-CM | POA: Diagnosis not present

## 2020-07-06 DIAGNOSIS — F840 Autistic disorder: Secondary | ICD-10-CM | POA: Diagnosis not present

## 2020-07-06 DIAGNOSIS — R202 Paresthesia of skin: Secondary | ICD-10-CM | POA: Insufficient documentation

## 2020-07-06 DIAGNOSIS — F4325 Adjustment disorder with mixed disturbance of emotions and conduct: Secondary | ICD-10-CM | POA: Diagnosis not present

## 2020-07-06 DIAGNOSIS — F319 Bipolar disorder, unspecified: Secondary | ICD-10-CM | POA: Diagnosis not present

## 2020-07-06 DIAGNOSIS — R0789 Other chest pain: Secondary | ICD-10-CM | POA: Diagnosis not present

## 2020-07-06 DIAGNOSIS — F39 Unspecified mood [affective] disorder: Secondary | ICD-10-CM | POA: Insufficient documentation

## 2020-07-06 DIAGNOSIS — F600 Paranoid personality disorder: Secondary | ICD-10-CM | POA: Diagnosis not present

## 2020-07-06 DIAGNOSIS — Z79899 Other long term (current) drug therapy: Secondary | ICD-10-CM | POA: Insufficient documentation

## 2020-07-06 DIAGNOSIS — R079 Chest pain, unspecified: Secondary | ICD-10-CM | POA: Diagnosis present

## 2020-07-06 LAB — BASIC METABOLIC PANEL
Anion gap: 12 (ref 5–15)
BUN: 9 mg/dL (ref 6–20)
CO2: 26 mmol/L (ref 22–32)
Calcium: 9 mg/dL (ref 8.9–10.3)
Chloride: 101 mmol/L (ref 98–111)
Creatinine, Ser: 0.87 mg/dL (ref 0.61–1.24)
GFR, Estimated: >60 mL/min (ref >60)
Glucose, Bld: 94 mg/dL (ref 70–99)
Potassium: 3.5 mmol/L (ref 3.5–5.1)
Sodium: 139 mmol/L (ref 135–145)

## 2020-07-06 LAB — CBC
HCT: 50.4 % (ref 39.0–52.0)
Hemoglobin: 16.8 g/dL (ref 13.0–17.0)
MCH: 29 pg (ref 26.0–34.0)
MCHC: 33.3 g/dL (ref 30.0–36.0)
MCV: 86.9 fL (ref 80.0–100.0)
Platelets: 322 10*3/uL (ref 150–400)
RBC: 5.8 MIL/uL (ref 4.22–5.81)
RDW: 12.8 % (ref 11.5–15.5)
WBC: 8.7 10*3/uL (ref 4.0–10.5)
nRBC: 0 % (ref 0.0–0.2)

## 2020-07-06 LAB — TROPONIN I (HIGH SENSITIVITY): Troponin I (High Sensitivity): 3 ng/L (ref <18)

## 2020-07-06 NOTE — ED Triage Notes (Signed)
Pt reports mid CP 7/10 that started a week ago. Seen 1/11 for same. Prescribed omeprazole, been taking with no relief. NAD noted. Pt states neuro diagnosis and states mom asking if we could possibly do scan of brain to see if related to tumor.

## 2020-07-07 ENCOUNTER — Emergency Department (EMERGENCY_DEPARTMENT_HOSPITAL)
Admission: EM | Admit: 2020-07-07 | Discharge: 2020-07-10 | Disposition: A | Discharge status: Home / Self Care | Payer: Medicare Other | Source: Home / Self Care | Attending: Emergency Medicine | Admitting: Emergency Medicine

## 2020-07-07 ENCOUNTER — Emergency Department: Payer: Medicare Other

## 2020-07-07 ENCOUNTER — Emergency Department
Admission: EM | Admit: 2020-07-07 | Discharge: 2020-07-07 | Disposition: A | Discharge status: Home / Self Care | Payer: Medicare Other | Attending: Student in an Organized Health Care Education/Training Program | Admitting: Student in an Organized Health Care Education/Training Program

## 2020-07-07 DIAGNOSIS — F4325 Adjustment disorder with mixed disturbance of emotions and conduct: Secondary | ICD-10-CM | POA: Insufficient documentation

## 2020-07-07 DIAGNOSIS — Z87891 Personal history of nicotine dependence: Secondary | ICD-10-CM | POA: Insufficient documentation

## 2020-07-07 DIAGNOSIS — F319 Bipolar disorder, unspecified: Secondary | ICD-10-CM

## 2020-07-07 DIAGNOSIS — F909 Attention-deficit hyperactivity disorder, unspecified type: Secondary | ICD-10-CM | POA: Insufficient documentation

## 2020-07-07 DIAGNOSIS — Q8501 Neurofibromatosis, type 1: Secondary | ICD-10-CM | POA: Diagnosis present

## 2020-07-07 DIAGNOSIS — F22 Delusional disorders: Secondary | ICD-10-CM

## 2020-07-07 DIAGNOSIS — F840 Autistic disorder: Secondary | ICD-10-CM | POA: Diagnosis present

## 2020-07-07 DIAGNOSIS — R0789 Other chest pain: Secondary | ICD-10-CM | POA: Diagnosis not present

## 2020-07-07 DIAGNOSIS — F600 Paranoid personality disorder: Secondary | ICD-10-CM | POA: Insufficient documentation

## 2020-07-07 DIAGNOSIS — F39 Unspecified mood [affective] disorder: Secondary | ICD-10-CM | POA: Insufficient documentation

## 2020-07-07 DIAGNOSIS — Z20822 Contact with and (suspected) exposure to covid-19: Secondary | ICD-10-CM | POA: Insufficient documentation

## 2020-07-07 DIAGNOSIS — F3130 Bipolar disorder, current episode depressed, mild or moderate severity, unspecified: Secondary | ICD-10-CM | POA: Insufficient documentation

## 2020-07-07 LAB — URINE DRUG SCREEN, QUALITATIVE (ARMC ONLY)
Amphetamines, Ur Screen: NOT DETECTED
Barbiturates, Ur Screen: NOT DETECTED
Benzodiazepine, Ur Scrn: NOT DETECTED
Cannabinoid 50 Ng, Ur ~~LOC~~: NOT DETECTED
Cocaine Metabolite,Ur ~~LOC~~: NOT DETECTED
MDMA (Ecstasy)Ur Screen: NOT DETECTED
Methadone Scn, Ur: NOT DETECTED
Opiate, Ur Screen: NOT DETECTED
Phencyclidine (PCP) Ur S: NOT DETECTED
Tricyclic, Ur Screen: NOT DETECTED

## 2020-07-07 LAB — TROPONIN I (HIGH SENSITIVITY): Troponin I (High Sensitivity): 4 ng/L (ref <18)

## 2020-07-07 MED ORDER — OLANZAPINE 5 MG PO TBDP
5.0000 mg | ORAL_TABLET | Freq: Once | ORAL | Status: AC
  Administered 2020-07-08: 5 mg via ORAL
  Filled 2020-07-07: qty 1

## 2020-07-07 MED ORDER — LORAZEPAM 1 MG PO TABS
1.0000 mg | ORAL_TABLET | Freq: Once | ORAL | Status: AC
  Administered 2020-07-08: 1 mg via ORAL
  Filled 2020-07-07: qty 1

## 2020-07-07 MED ORDER — IOHEXOL 350 MG/ML SOLN
75.0000 mL | Freq: Once | INTRAVENOUS | Status: AC | PRN
  Administered 2020-07-07: 75 mL via INTRAVENOUS

## 2020-07-07 NOTE — Consult Note (Signed)
[facility] Face-to-Face Psychiatry Consult   Reason for Consult:  Psych Evaluation Referring Physician:  Dr. Erma Heritage Patient Identification: Connor [name] MRN:  [id number] Principal Diagnosis: Bipolar I disorder, most recent episode depressed with anxious distress (HCC) Diagnosis:  Principal Problem:   Bipolar I disorder, most recent episode depressed with anxious distress (HCC)   Total Time spent with patient: 1 hour  Subjective:   Connor [name] is a 20 y.o. male patient admitted to [facility] per triage nurse, pt very anxious, states he thinks something is really wrong with him. Pt states "i'm not crazy, I really need someone to believe me". Pt states he has felt weak, has had blurred vision, and thinks radiation "reading on me is really high". Pt rambling is pacing in triage. Pt states he has been constipated, feels like he may have ruptured his spleen and also complains of generalized body pain.   HPI:   Connor [name], 20 y.o., male patient presented to [facility].  Patient seen face to face by TTS and this provider; chart reviewed and consulted with Dr. Lucianne Muss on 07/07/20.  On evaluation Connor [name] reports that he feels sick.  He says no one is listening to him but that he feels really ill.  He says his back, lungs, heart and kidneys feel sick. Patient has visited the ER 5 times in the past month for various complaints of physical illness.  Per TTS, Collateral information was obtained from patient's mother Connor [name] [phone]) who reports "He hasn't been well, he hasn't been following up with his GI appointments and because he hasn't been feeling well he has been on Youtube researching symptoms he thinks he has, he just lives in his room, I believe it's more mental and not physical, every day he comes up with different things he has, he will say to me "I feel like I'm dying", he also told me that he hasn't been taking his medications." Patient's  mother confirms that patient has a psychiatrist with [facility].   During evaluation Connor [name] is laying in bed; he is alert/oriented x 3; anxious/cooperative; and mood congruent with flat affect.  Patient is speaking in a clear tone at moderate volume, and normal pace; with fair eye contact.  His thought process is blocked and speech is slowed. There is no indication that he is currently responding to internal/external stimuli.  His thoughts are delusional in content.  Patient denies suicidal/self-harm/homicidal ideation, psychosis, and paranoia.    Recommendations:  Inpatient psychiatric hospitalization for medication stabilization   Past Psychiatric History: Bipolar disorder, autism  Risk to Self:   Risk to Others:   Prior Inpatient Therapy:   Prior Outpatient Therapy:    Past Medical History:  Past Medical History:  Diagnosis Date  . ADHD (attention deficit hyperactivity disorder)   . Autism   . Depressed   . Neurofibromatosis (HCC)    Type 1  . Obesity   . Pertussis    as a infant    Past Surgical History:  Procedure Laterality Date  . MRI    . RADIOLOGY WITH ANESTHESIA N/A 08/13/2012   Procedure: RADIOLOGY WITH ANESTHESIA;  Surgeon: Medication Radiologist, MD;  Location: [facility] OR;  Service: Radiology;  Laterality: N/A;  MRI    Family History:  Family History  Problem Relation Age of Onset  . Anxiety disorder Mother   . Depression Mother   . OCD Mother   . Hypertension Mother   .  Cancer Maternal Aunt   . Cancer Maternal Grandmother   . Hypertension Father   . Schizophrenia Maternal Uncle    Family Psychiatric  History: unknown Social History:  Social History   Substance and Sexual Activity  Alcohol Use No  . Alcohol/week: 0.0 standard drinks     Social History   Substance and Sexual Activity  Drug Use No    Social History   Socioeconomic History  . Marital status: Single    Spouse name: Not on file  . Number of children:  Not on file  . Years of education: Not on file  . Highest education level: Not on file  Occupational History  . Not on file  Tobacco Use  . Smoking status: Former Smoker    Packs/day: 1.00    Years: 1.00    Pack years: 1.00    Types: Cigarettes    Quit date: 12/31/2014    Years since quitting: 5.5  . Smokeless tobacco: Never Used  Substance and Sexual Activity  . Alcohol use: No    Alcohol/week: 0.0 standard drinks  . Drug use: No  . Sexual activity: Not Currently    Birth control/protection: None  Other Topics Concern  . Not on file  Social History Narrative   Levi is a high school drop out.   He lives with his mom only. He has one sister.   He enjoys eating, sleeping, and watching tv.   Social Determinants of Health   Financial Resource Strain: Not on file  Food Insecurity: Not on file  Transportation Needs: Not on file  Physical Activity: Not on file  Stress: Not on file  Social Connections: Not on file   Additional Social History:    Allergies:  No Known Allergies  Labs:  Results for orders placed or performed during the hospital encounter of 07/07/20 (from the past 48 hour(s))  Urine Drug Screen, Qualitative     Status: None   Collection Time: 07/07/20  9:40 PM  Result Value Ref Range   Tricyclic, Ur Screen NONE DETECTED NONE DETECTED   Amphetamines, Ur Screen NONE DETECTED NONE DETECTED   MDMA (Ecstasy)Ur Screen NONE DETECTED NONE DETECTED   Cocaine Metabolite,Ur [place] NONE DETECTED NONE DETECTED   Opiate, Ur Screen NONE DETECTED NONE DETECTED   Phencyclidine (PCP) Ur S NONE DETECTED NONE DETECTED   Cannabinoid 50 Ng, Ur [place] NONE DETECTED NONE DETECTED   Barbiturates, Ur Screen NONE DETECTED NONE DETECTED   Benzodiazepine, Ur Scrn NONE DETECTED NONE DETECTED   Methadone Scn, Ur NONE DETECTED NONE DETECTED    Comment: (NOTE) Tricyclics + metabolites, urine    Cutoff 1000 ng/mL Amphetamines + metabolites, urine  Cutoff 1000 ng/mL MDMA (Ecstasy), urine               Cutoff 500 ng/mL Cocaine Metabolite, urine          Cutoff 300 ng/mL Opiate + metabolites, urine        Cutoff 300 ng/mL Phencyclidine (PCP), urine         Cutoff 25 ng/mL Cannabinoid, urine                 Cutoff 50 ng/mL Barbiturates + metabolites, urine  Cutoff 200 ng/mL Benzodiazepine, urine              Cutoff 200 ng/mL Methadone, urine                   Cutoff 300 ng/mL  The urine drug screen  provides only a preliminary, unconfirmed analytical test result and should not be used for non-medical purposes. Clinical consideration and professional judgment should be applied to any positive drug screen result due to possible interfering substances. A more specific alternate chemical method must be used in order to obtain a confirmed analytical result. Gas chromatography / mass spectrometry (GC/MS) is the preferred confirm atory method. Performed at [facility], [address] [place], [place] [address]     No current facility-administered medications for this encounter.   Current Outpatient Medications  Medication Sig Dispense Refill  . omeprazole (PRILOSEC) 20 MG capsule Take 20 mg by mouth daily.    . ziprasidone (GEODON) 40 MG capsule Take 40 mg by mouth 2 (two) times daily with a meal.      Musculoskeletal: Strength & Muscle Tone: within normal limits Gait & Station: normal Patient leans: N/A  Psychiatric Specialty Exam: Physical Exam Vitals and nursing note reviewed.     Review of Systems  Psychiatric/Behavioral: Positive for decreased concentration, dysphoric mood and sleep disturbance. Negative for suicidal ideas. The patient is nervous/anxious.   All other systems reviewed and are negative.   Blood pressure (!) 165/101, pulse 98, resp. rate 16, height 6\' 3"  (1.905 m), weight 130 kg, SpO2 100 %.Body mass index is 35.82 kg/m.  General Appearance: Bizarre and Casual  Eye Contact:  Fair  Speech:  Blocked  Volume:  Decreased  Mood:  Anxious and  Dysphoric  Affect:  Flat  Thought Process:  Disorganized  Orientation:  Full (Time, Place, and Person)  Thought Content:  Obsessions  Suicidal Thoughts:  No  Homicidal Thoughts:  No  Memory:  Immediate;   Fair  Judgement:  Impaired  Insight:  Lacking  Psychomotor Activity:  Decreased  Concentration:  Attention Span: Fair  Recall:  Poor  Fund of Knowledge:  Fair  Language:  Fair  Akathisia:  NA  Handed:  Right  AIMS (if indicated):     Assets:  Social Support  ADL's:  Intact  Cognition:  WNL  Sleep:        Treatment Plan Summary: Daily contact with patient to assess and evaluate symptoms and progress in treatment and Medication management  Disposition: Recommend psychiatric Inpatient admission when medically cleared. Supportive therapy provided about ongoing stressors. Discussed crisis plan, support from social network, calling 911, coming to the Emergency Department, and calling Suicide Hotline.  Manufacturing systems engineer, NP 07/07/2020 10:53 PM

## 2020-07-07 NOTE — ED Notes (Signed)
Provided DC instructions. Verbalized understanding.  

## 2020-07-07 NOTE — ED Provider Notes (Signed)
[facility] Emergency Department Provider Note    Event Date/Time   First MD Initiated Contact with Patient 07/07/20 [phone]     (approximate)  I have reviewed the triage vital signs and the nursing notes.   HISTORY  Chief Complaint Chest Pain    HPI Connor [name] is a 20 y.o. male below listed past medical history presents to ER for evaluation of midsternal nonradiating discomfort and shortness of breath and chest pressure for the past week. No positional changes. No radiating discomfort to his jaw or back. Is never had symptoms like this before. No measured fevers. Denies any new numbness or tingling. States that we will have shooting pains in his chest. Tingling sensation is. No new medication changes. Does not feel particularly anxious. Does not feel depressed. Denies SI or HI.   Past Medical History:  Diagnosis Date  . ADHD (attention deficit hyperactivity disorder)   . Autism   . Depressed   . Neurofibromatosis (HCC)    Type 1  . Obesity   . Pertussis    as a infant   Family History  Problem Relation Age of Onset  . Anxiety disorder Mother   . Depression Mother   . OCD Mother   . Hypertension Mother   . Cancer Maternal Aunt   . Cancer Maternal Grandmother   . Hypertension Father   . Schizophrenia Maternal Uncle    Past Surgical History:  Procedure Laterality Date  . MRI    . RADIOLOGY WITH ANESTHESIA N/A 08/13/2012   Procedure: RADIOLOGY WITH ANESTHESIA;  Surgeon: Medication Radiologist, MD;  Location: [facility] OR;  Service: Radiology;  Laterality: N/A;  MRI    Patient Active Problem List   Diagnosis Date Noted  . 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 Bipolar I disorder, most recent episode depressed with anxious distress (HCC) 08/06/2017  . ADHD (attention deficit hyperactivity disorder) 07/08/2017  . Personality disorder (HCC) 07/08/2017  . Insomnia 06/09/2017  . Aggression  01/31/2016  . 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 Autism spectrum disorder associated with known medical or genetic condition or environmental factor, requiring substantial support (level 2) 11/06/2015  . 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 Authorizing Provider  doxepin (SINEQUAN) 100 MG capsule Take 1 capsule (100 mg total) by mouth at bedtime. 08/06/17   Pucilowska, Jolanta B, MD  lurasidone (LATUDA) 80 MG TABS tablet Take 1 tablet (80 mg total) by mouth daily before supper. 08/06/17   Pucilowska, Jolanta B, MD  OLANZapine (ZYPREXA) 5 MG tablet Take 1 tablet (5 mg total) by mouth daily as needed (agitation). 08/06/17   Pucilowska, Braulio Conte B, MD  sertraline (ZOLOFT) 100 MG tablet Take 2 tablets (200 mg total) by mouth daily. 08/06/17   Pucilowska, Ellin Goodie, MD    Allergies Patient has no known allergies.    Social History Social History   Tobacco Use  . Smoking status: Former Smoker    Packs/day: 1.00    Years: 1.00    Pack years: 1.00    Types: Cigarettes    Quit date: 12/31/2014    Years since quitting: 5.5  . Smokeless tobacco: Never Used  Substance Use Topics  . Alcohol use: No    Alcohol/week: 0.0 standard drinks  . Drug use: No  Review of Systems Patient denies headaches, rhinorrhea, blurry vision, numbness, shortness of breath, chest pain, edema, cough, abdominal pain, nausea, vomiting, diarrhea, dysuria, fevers, rashes or hallucinations unless otherwise stated above in HPI. ____________________________________________   PHYSICAL EXAM:  VITAL SIGNS: Vitals:   07/07/20 0344 07/07/20 0704  BP: (!) 147/98 (!) 148/93  Pulse: (!) 114 (!) 109  Resp: 18 18  Temp: 98.8 F (37.1 C)   SpO2: 97% 99%    Constitutional: Alert and oriented.  Eyes: Conjunctivae are normal.  Head:  Atraumatic. Nose: No congestion/rhinnorhea. Mouth/Throat: Mucous membranes are moist.   Neck: No stridor. Painless ROM.  Cardiovascular: Normal rate, regular rhythm. Grossly normal heart sounds.  Good peripheral circulation. Respiratory: Normal respiratory effort.  No retractions. Lungs CTAB. Gastrointestinal: Soft and nontender. No distention. No abdominal bruits. No CVA tenderness. Genitourinary:  Musculoskeletal: No lower extremity tenderness nor edema.  No joint effusions. Neurologic:  Normal speech and language. No gross focal neurologic deficits are appreciated. No facial droop Skin:  Skin is warm, dry and intact. No rash noted. Psychiatric: calm cooperative, no SI or HI.  ____________________________________________   LABS (all labs ordered are listed, but only abnormal results are displayed)  Results for orders placed or performed during the hospital encounter of 07/07/20 (from the past 24 hour(s))  Basic metabolic panel     Status: None   Collection Time: 07/06/20  8:55 PM  Result Value Ref Range   Sodium 139 135 - 145 mmol/L   Potassium 3.5 3.5 - 5.1 mmol/L   Chloride 101 98 - 111 mmol/L   CO2 26 22 - 32 mmol/L   Glucose, Bld 94 70 - 99 mg/dL   BUN 9 6 - 20 mg/dL   Creatinine, Ser 3.71 0.61 - 1.24 mg/dL   Calcium 9.0 8.9 - 06.2 mg/dL   GFR, Estimated >69 >48 mL/min   Anion gap 12 5 - 15  CBC     Status: None   Collection Time: 07/06/20  8:55 PM  Result Value Ref Range   WBC 8.7 4.0 - 10.5 K/uL   RBC 5.80 4.22 - 5.81 MIL/uL   Hemoglobin 16.8 13.0 - 17.0 g/dL   HCT 54.6 27.0 - 35.0 %   MCV 86.9 80.0 - 100.0 fL   MCH 29.0 26.0 - 34.0 pg   MCHC 33.3 30.0 - 36.0 g/dL   RDW 09.3 81.8 - 29.9 %   Platelets 322 150 - 400 K/uL   nRBC 0.0 0.0 - 0.2 %  Troponin I (High Sensitivity)     Status: None   Collection Time: 07/06/20  8:55 PM  Result Value Ref Range   Troponin I (High Sensitivity) 3 <18 ng/L  Troponin I (High Sensitivity)     Status: None   Collection Time:  07/07/20  3:51 AM  Result Value Ref Range   Troponin I (High Sensitivity) 4 <18 ng/L   ____________________________________________  EKG My review and personal interpretation at Time: 20:55   Indication: chest pain  Rate: 125  Rhythm: sinus Axis: normal Other: nonspecific st abn, no stemi ____________________________________________  RADIOLOGY  I personally reviewed all radiographic images ordered to evaluate for the above acute complaints and reviewed radiology reports and findings.  These findings were personally discussed with the patient.  Please see medical record for radiology report.  ____________________________________________   PROCEDURES  Procedure(s) performed:  Procedures    Critical Care performed: no ____________________________________________   INITIAL IMPRESSION / ASSESSMENT AND PLAN / ED COURSE  Pertinent labs &  imaging results that were available during my care of the patient were reviewed by me and considered in my medical decision making (see chart for details).   DDX: ACS, pericarditis, esophagitis, boerhaaves, pe, dissection, pna, bronchitis, costochondritis   Connor [name] is a 20 y.o. who presents to the ED with agitation as described above.  He is nontoxic-appearing hemodynamically stable does have mild tachycardia however.  EKG nonischemic troponins are negative.  Is not consistent with ACS or pericarditis.  Given his tachycardia and description of symptoms will order CTA as D-dimer testing not currently available at this facility.  Patient agreeable to plan.  Remainder of exam is reassuring.  Anticipate if CTA is negative will be appropriate for outpatient follow-up.  Clinical Course as of 07/07/20 5790  Caleen Essex Jul 07, 2020  0703 CT angio reviewed by me does not appear to show any large saddle PE or explanation for the patient's symptoms.  Still awaiting formal read.  Remains hemodynamically stable. [PR]    Clinical Course User  Index [PR] Willy Eddy, MD   Patient will be signed out to oncoming physician pending CTA.  If negative anticipate discharge home with outpatient follow up.  Patient updated and agreeable to plan.  The patient was evaluated in Emergency Department today for the symptoms described in the history of present illness. He/she was evaluated in the context of the global COVID-19 pandemic, which necessitated consideration that the patient might be at risk for infection with the SARS-CoV-2 virus that causes COVID-19. Institutional protocols and algorithms that pertain to the evaluation of patients at risk for COVID-19 are in a state of rapid change based on information released by regulatory bodies including the CDC and federal and state organizations. These policies and algorithms were followed during the patient's care in the ED.  As part of my medical decision making, I reviewed the following data within the electronic MEDICAL RECORD NUMBER Nursing notes reviewed and incorporated, Labs reviewed, notes from prior ED visits and [place] Controlled Substance Database   ____________________________________________   FINAL CLINICAL IMPRESSION(S) / ED DIAGNOSES  Final diagnoses:  Atypical chest pain      NEW MEDICATIONS STARTED DURING THIS VISIT:  New Prescriptions   No medications on file     Note:  This document was prepared using Dragon voice recognition software and may include unintentional dictation errors.    Willy Eddy, MD 07/07/20 [phone]

## 2020-07-07 NOTE — ED Notes (Signed)
Will need to obtain temperature in treatment area, not able to secure thermometer that is working in triage.

## 2020-07-07 NOTE — ED Notes (Signed)
Pt resting comfortably at this time NAD noted. Pt denies chest pain at this time. Pt waiting for CTA at this time.

## 2020-07-07 NOTE — ED Notes (Signed)
After conversing in triage - pt has agreed to dress out to go to room 23 and give urine specimen  PT TAKES TEGRETOL AND THERAPEUTIC LEVEL SHOULD BE CONSIDERED

## 2020-07-07 NOTE — ED Notes (Signed)
Pt declining blood work in triage.

## 2020-07-07 NOTE — ED Triage Notes (Addendum)
Pt very anxious, states he thinks something is really wrong with him. Pt states "i'm not crazy, I really need someone to believe me". Pt states he has felt weak, has had blurred vision, and thinks radiation "reading on me is really high". Pt rambling is pacing in triage. Pt states he has been constipated, feels like he may have ruptured his spleen and also complains of generalized body pain.

## 2020-07-07 NOTE — ED Notes (Signed)
D/C and lab results as well a imaging discussed with pt and mother due to others concerns for being upset that pt was being discharged. Pt and mom understands discharge at this time and no further questions for this RN. NAD noted. VSS.

## 2020-07-07 NOTE — ED Provider Notes (Signed)
[facility] Emergency Department Provider Note  ____________________________________________   Event Date/Time   First MD Initiated Contact with Patient 07/07/20 2217     (approximate)  I have reviewed the triage vital signs and the nursing notes.   HISTORY  Chief Complaint Psychiatric Evaluation    HPI Connor [name] is a 20 y.o. male with past medical history of autism, reported bipolar disorder, here with erratic behavior.  The patient arrives and says that he just feels like something is wrong with him.  He is highly anxious and says that he cannot describe it, he just noticed that something is seriously wrong with him.  According to his mother, he has been increasingly paranoid, erratic, and researching medical conditions thinking that he has been ill.  He has been forgetting to take his medications.  This is the worst that he has ever been.  She is concerned that he is decompensating.  She does not believe he is use any substances.  He is unable to elaborate any specific complaints on my interview.  Level 5 caveat invoked as remainder of history, ROS, and physical exam limited due to patient's psychiatric condition.         Past Medical History:  Diagnosis Date  . ADHD (attention deficit hyperactivity disorder)   . Autism   . Depressed   . Neurofibromatosis (HCC)    Type 1  . Obesity   . Pertussis    as a infant    Patient Active Problem List   Diagnosis Date Noted  . 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 Bipolar I disorder, most recent episode depressed with anxious distress (HCC) 08/06/2017  . ADHD (attention deficit hyperactivity disorder) 07/08/2017  . Personality disorder (HCC) 07/08/2017  . Insomnia 06/09/2017  . Aggression 01/31/2016  . 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 Autism spectrum disorder associated with known medical or genetic condition or environmental  factor, requiring substantial support (level 2) 11/06/2015  . 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 MRI    . RADIOLOGY WITH ANESTHESIA N/A 08/13/2012   Procedure: RADIOLOGY WITH ANESTHESIA;  Surgeon: Medication Radiologist, MD;  Location: [facility] OR;  Service: Radiology;  Laterality: N/A;  MRI     Prior to Admission medications   Medication Sig Start Date End Date Taking? Authorizing Provider  omeprazole (PRILOSEC) 20 MG capsule Take 20 mg by mouth daily. 07/05/20  Yes [provider]  ziprasidone (GEODON) 40 MG capsule Take 40 mg by mouth 2 (two) times daily with a meal.   Yes [provider]    Allergies Patient has no known allergies.  Family History  Problem Relation Age of Onset  . Anxiety disorder Mother   . Depression Mother   . OCD Mother   . Hypertension Mother   . Cancer Maternal Aunt   . Cancer Maternal Grandmother   . Hypertension Father   . Schizophrenia Maternal Uncle     Social History Social History   Tobacco Use  . Smoking status: Former Smoker    Packs/day: 1.00    Years: 1.00    Pack years: 1.00    Types: Cigarettes    Quit date: 12/31/2014    Years since quitting: 5.5  . Smokeless tobacco: Never Used  Substance Use Topics  . Alcohol use: No    Alcohol/week:  0.0 standard drinks  . Drug use: No    Review of Systems  Review of Systems  Constitutional: Negative for chills, fatigue and fever.  HENT: Negative for sore throat.   Respiratory: Negative for shortness of breath.   Cardiovascular: Negative for chest pain.  Gastrointestinal: Negative for abdominal pain.  Genitourinary: Negative for flank pain.  Musculoskeletal: Negative for neck pain.  Skin: Negative for rash and wound.  Allergic/Immunologic: Negative for immunocompromised state.  Neurological: Negative for weakness and  numbness.  Hematological: Does not bruise/bleed easily.  Psychiatric/Behavioral: Positive for behavioral problems and hallucinations.     ____________________________________________  PHYSICAL EXAM:      VITAL SIGNS: ED Triage Vitals [07/07/20 1948]  Enc Vitals Group     BP (!) 165/101     Pulse Rate 98     Resp 16     Temp      Temp src      SpO2 100 %     Weight 286 lb 9.6 oz (130 kg)     Height 6\' 3"  (1.905 m)     Head Circumference      Peak Flow      Pain Score      Pain Loc      Pain Edu?      Excl. in GC?      Physical Exam Vitals and nursing note reviewed.  Constitutional:      General: He is not in acute distress.    Appearance: He is well-developed and well-nourished.  HENT:     Head: Normocephalic and atraumatic.  Eyes:     Conjunctiva/sclera: Conjunctivae normal.  Cardiovascular:     Rate and Rhythm: Normal rate and regular rhythm.     Heart sounds: Normal heart sounds.  Pulmonary:     Effort: Pulmonary effort is normal. No respiratory distress.     Breath sounds: No wheezing.  Abdominal:     General: There is no distension.  Musculoskeletal:        General: No edema.     Cervical back: Neck supple.  Skin:    General: Skin is warm.     Capillary Refill: Capillary refill takes less than 2 seconds.     Findings: No rash.  Neurological:     Mental Status: He is alert and oriented to person, place, and time.     Motor: No abnormal muscle tone.  Psychiatric:        Thought Content: Thought content is paranoid.       ____________________________________________   LABS (all labs ordered are listed, but only abnormal results are displayed)  Labs Reviewed  URINE DRUG SCREEN, QUALITATIVE ([facility] ONLY)  COMPREHENSIVE METABOLIC PANEL  ETHANOL  SALICYLATE LEVEL  ACETAMINOPHEN LEVEL  CBC  CARBAMAZEPINE LEVEL, TOTAL    ____________________________________________  EKG:  ________________________________________  RADIOLOGY All imaging,  including plain films, CT scans, and ultrasounds, independently reviewed by me, and interpretations confirmed via formal radiology reads.  ED MD interpretation:   None  Official radiology report(s): CT Angio Chest PE W and/or Wo Contrast  Result Date: 07/07/2020 CLINICAL DATA:  20 year old male with left chest pain x1 week. Autism, type 1 neural fibromatosis. EXAM: CT ANGIOGRAPHY CHEST WITH CONTRAST TECHNIQUE: Multidetector CT imaging of the chest was performed using the standard protocol during bolus administration of intravenous contrast. Multiplanar CT image reconstructions and MIPs were obtained to evaluate the vascular anatomy. CONTRAST:  70mL OMNIPAQUE IOHEXOL 350 MG/ML SOLN COMPARISON:  Chest radiographs 07/06/2020 and earlier. FINDINGS:  Cardiovascular:  Suboptimal Mediastinum/Nodes: However, there is little to no respiratory motion on the exam, and No focal filling defect is identified in the pulmonary arteries to suggest acute pulmonary embolism. No cardiomegaly or pericardial effusion. Negative visible aorta. No calcified coronary artery atherosclerosis is evident. Lungs/Pleura: Negative. No lymphadenopathy. Faint residual thymic tissue. Gas throughout the thoracic esophagus but no esophageal dilatation or wall thickening. Upper Abdomen: Major airways are patent.  Both lungs appear clear. Musculoskeletal: Pectus excavatum deformity (series 8, image 72). Mild thoracic scoliosis. Otherwise visible osseous structures are within normal limits. No paraspinal, chest wall or other subcutaneous soft tissue tumor identified. Review of the MIP images confirms the above findings. IMPRESSION: 1.  Negative for acute pulmonary embolus. 2. Negative Chest CTA aside from pectus excavatum. No stigmata of neurofibromatosis identified. Electronically Signed   By: Odessa Fleming M.D.   On: 07/07/2020 07:34    ____________________________________________  PROCEDURES   Procedure(s) performed (including Critical  Care):  Procedures  ____________________________________________  INITIAL IMPRESSION / MDM / ASSESSMENT AND PLAN / ED COURSE  As part of my medical decision making, I reviewed the following data within the electronic MEDICAL RECORD NUMBER Nursing notes reviewed and incorporated, Old chart reviewed, Notes from prior ED visits, and [place] Controlled Substance Database       *Connor [name] was evaluated in Emergency Department on 07/07/2020 for the symptoms described in the history of present illness. He was evaluated in the context of the global COVID-19 pandemic, which necessitated consideration that the patient might be at risk for infection with the SARS-CoV-2 virus that causes COVID-19. Institutional protocols and algorithms that pertain to the evaluation of patients at risk for COVID-19 are in a state of rapid change based on information released by regulatory bodies including the CDC and federal and state organizations. These policies and algorithms were followed during the patient's care in the ED.  Some ED evaluations and interventions may be delayed as a result of limited staffing during the pandemic.*     Medical Decision Making:  20 yo M here with anxiety, paranoia. Concern for decompensated bipolar disorder (diagnosed per records) vs paranoia/behavioral issues related to his autism. I had a long discussion with pt's mother - pt is increasingly erratic, agitated, and having difficulty caring for himself. He is unable to articulate much to me. Feel pt needs more emergent psych evaluation. Will IVC given concerns for safety and ability to care for himself.  ____________________________________________  FINAL CLINICAL IMPRESSION(S) / ED DIAGNOSES  Final diagnoses:  Paranoia (HCC)     MEDICATIONS GIVEN DURING THIS VISIT:  Medications  OLANZapine zydis (ZYPREXA) disintegrating tablet 5 mg (has no administration in time range)  LORazepam (ATIVAN) tablet 1 mg (has no  administration in time range)     ED Discharge Orders    None       Note:  This document was prepared using Dragon voice recognition software and may include unintentional dictation errors.   Shaune Pollack, MD 07/07/20 [phone]

## 2020-07-08 DIAGNOSIS — R0789 Other chest pain: Secondary | ICD-10-CM | POA: Diagnosis not present

## 2020-07-08 LAB — COMPREHENSIVE METABOLIC PANEL
ALT: 21 U/L (ref 0–44)
AST: 17 U/L (ref 15–41)
Albumin: 4.6 g/dL (ref 3.5–5.0)
Alkaline Phosphatase: 64 U/L (ref 38–126)
Anion gap: 9 (ref 5–15)
BUN: 14 mg/dL (ref 6–20)
CO2: 30 mmol/L (ref 22–32)
Calcium: 9.7 mg/dL (ref 8.9–10.3)
Chloride: 101 mmol/L (ref 98–111)
Creatinine, Ser: 0.85 mg/dL (ref 0.61–1.24)
GFR, Estimated: >60 mL/min (ref >60)
Glucose, Bld: 94 mg/dL (ref 70–99)
Potassium: 3.5 mmol/L (ref 3.5–5.1)
Sodium: 140 mmol/L (ref 135–145)
Total Bilirubin: 0.6 mg/dL (ref 0.3–1.2)
Total Protein: 8 g/dL (ref 6.5–8.1)

## 2020-07-08 LAB — RESP PANEL BY RT-PCR (FLU A&B, COVID) ARPGX2
Influenza A by PCR: NEGATIVE
Influenza B by PCR: NEGATIVE
SARS Coronavirus 2 by RT PCR: NEGATIVE

## 2020-07-08 LAB — CBC
HCT: 49.8 % (ref 39.0–52.0)
Hemoglobin: 16.5 g/dL (ref 13.0–17.0)
MCH: 29.2 pg (ref 26.0–34.0)
MCHC: 33.1 g/dL (ref 30.0–36.0)
MCV: 88 fL (ref 80.0–100.0)
Platelets: 332 10*3/uL (ref 150–400)
RBC: 5.66 MIL/uL (ref 4.22–5.81)
RDW: 12.9 % (ref 11.5–15.5)
WBC: 7.4 10*3/uL (ref 4.0–10.5)
nRBC: 0 % (ref 0.0–0.2)

## 2020-07-08 NOTE — ED Notes (Signed)
Patient is IVC pending inpatient admit 

## 2020-07-08 NOTE — ED Notes (Signed)
Patient out to hall asking what's taking so long, patient advised he is awaiting psych eval and plan of care. Patient verbalized understanding, requested phone to call his mother. Phone provided during phone hours. paient calm and cooperative.

## 2020-07-08 NOTE — BH Assessment (Signed)
Comprehensive Clinical Assessment (CCA) Note  07/08/2020 Connor [name] [id number]  Chief Complaint: Patient is 20 year old male presenting to [facility] ED initially voluntary but has since been IVC'd by EDP. Per triage note Pt very anxious, states he thinks something is really wrong with him. Pt states "i'm not crazy, I really need someone to believe me". Pt states he has felt weak, has had blurred vision, and thinks radiation "reading on me is really high". Pt rambling is pacing in triage. Pt states he has been constipated, feels like he may have ruptured his spleen and also complains of generalized body pain. Patient was just recently discharged this morning from [facility] ED with complaints of discomfort around his stomach, shortness of breath and chest pressure. Per patient's chart he also presented to [facility] Emergency for complaints of chest pain on 07/04/20. During assessment patient appeared alert and oriented x4, anxious but cooperative with continued somatic delusions. Patient reports "I think it's something wrong with me medically." Patient points to areas around his stomach, his back and left arm, he isn't able to describe what it feels like patient just continues to report that he doesn't feel good. Patient also reports no sleeping "for 3-4 months" and a lack of appetite. Patient reports to psychiatric team that he is taking his medications as prescribed as sees his Psychiatrist "Sherlean Foot." Patient denies SI/HI/AH/VH and does not appear to be responding to any internal or external stimuli.  Collateral information was obtained from patient's mother Lealon Vanputten [phone]) who reports "He hasn't been well, he hasn't been following up with his GI appointments and because he hasn't been feeling well he has been on Youtube researching symptoms he thinks he has, he just lives in his room, I believe it's more mental and not physical, every day he comes up with different things he has, he  will say to me "I feel like I'm dying", he also told me that he hasn't been taking his medications." Patient's mother confirms that patient has a psychiatrist with [facility].   Per Psyc NP Lerry Liner patient is recommended for Inpatient Hospitalization  Chief Complaint  Patient presents with  . Psychiatric Evaluation  . Delusional   Visit Diagnosis: Bipolar Disorder by hx   CCA Screening, Triage and Referral (STR)  Patient Reported Information How did you hear about [place]? Self  Referral name: No data recorded Referral phone number: No data recorded  Whom do you see for routine medical problems? Other (Comment)  Practice/Facility Name: No data recorded Practice/Facility Phone Number: No data recorded Name of Contact: No data recorded Contact Number: No data recorded Contact Fax Number: No data recorded Prescriber Name: No data recorded Prescriber Address (if known): No data recorded  What Is the Reason for Your Visit/Call Today? Patient believes to have medical issues  How Long Has This Been Causing You Problems? > than 6 months  What Do You Feel Would Help You the Most Today? Medication; Therapy; Assessment Only   Have You Recently Been in Any Inpatient Treatment (Hospital/Detox/Crisis Center/28-Day Program)? No  Name/Location of Program/Hospital:No data recorded How Long Were You There? No data recorded When Were You Discharged? No data recorded  Have You Ever Received Services From [facility] Before? Yes  Who Do You See at [facility]? Inpatient treatment   Have You Recently Had Any Thoughts About Hurting Yourself? No  Are You Planning to Commit Suicide/Harm Yourself At This time? No   Have you Recently Had Thoughts  About Hurting Someone Karolee Ohslse? No  Explanation: No data recorded  Have You Used Any Alcohol or Drugs in the Past 24 Hours? No  How Long Ago Did You Use Drugs or Alcohol? No data recorded What Did You Use and How Much? No data  recorded  Do You Currently Have a Therapist/Psychiatrist? Yes  Name of Therapist/Psychiatrist: [facility]   Have You Been Recently Discharged From Any Office Practice or Programs? No  Explanation of Discharge From Practice/Program: No data recorded    CCA Screening Triage Referral Assessment Type of Contact: Face-to-Face  Is this Initial or Reassessment? No data recorded Date Telepsych consult ordered in [facility]:  No data recorded Time Telepsych consult ordered in [facility]:  No data recorded  Patient Reported Information Reviewed? Yes  Patient Left Without Being Seen? No data recorded Reason for Not Completing Assessment: No data recorded  Collateral Involvement: Della Gooawn Goetzinger-Mother   Does Patient Have a Automotive engineerCourt Appointed Legal Guardian? No data recorded Name and Contact of Legal Guardian: No data recorded If Minor and Not Living with Parent(s), Who has Custody? No data recorded Is CPS involved or ever been involved? Never  Is APS involved or ever been involved? Never   Patient Determined To Be At Risk for Harm To Self or Others Based on Review of Patient Reported Information or Presenting Complaint? No  Method: No data recorded Availability of Means: No data recorded Intent: No data recorded Notification Required: No data recorded Additional Information for Danger to Others Potential: No data recorded Additional Comments for Danger to Others Potential: No data recorded Are There Guns or Other Weapons in Your Home? No data recorded Types of Guns/Weapons: No data recorded Are These Weapons Safely Secured?                            No data recorded Who Could Verify You Are Able To Have These Secured: No data recorded Do You Have any Outstanding Charges, Pending Court Dates, Parole/Probation? No data recorded Contacted To Inform of Risk of Harm To Self or Others: No data recorded  [facility]   Does Patient Present under Involuntary  Commitment? No (Patient has been IVC'd)  IVC Papers Initial File Date: No data recorded  [place]   Patient Currently Receiving the Following Services: Medication Management   Determination of Need: Emergent (2 hours)   Options For Referral: No data recorded    CCA Biopsychosocial Intake/Chief Complaint:  Patient presents believing that he has medial issues  Current Symptoms/Problems: Patient presents believing that he has medial issues   Patient Reported Schizophrenia/Schizoaffective Diagnosis in Past: -- (Unknown)   Strengths: Unknown  Preferences: Unknown  Abilities: Unknown   Type of Services Patient Feels are Needed: Medical   Initial Clinical Notes/Concerns: None   Mental Health Symptoms Depression:  Hopelessness; Irritability; Increase/decrease in appetite; Sleep (too much or little)   Duration of Depressive symptoms: Greater than two weeks   Mania:  Irritability; Racing thoughts; Change in energy/activity   Anxiety:   Irritability; Difficulty concentrating; Worrying   Psychosis:  Delusions   Duration of Psychotic symptoms: Less than six months   Trauma:  None   Obsessions:  Poor insight   Compulsions:  Poor Insight   Inattention:  None   Hyperactivity/Impulsivity:  Feeling of restlessness   Oppositional/Defiant Behaviors:  None   Emotional Irregularity:  None   Other Mood/Personality Symptoms:  No data recorded  Mental Status Exam Appearance and self-care  Stature:  Average   Weight:  Average weight   Clothing:  Casual   Grooming:  Normal   Cosmetic use:  None   Posture/gait:  Normal   Motor activity:  Not Remarkable   Sensorium  Attention:  Normal   Concentration:  Normal   Orientation:  X5   Recall/memory:  Normal   Affect and Mood  Affect:  Anxious   Mood:  Anxious   Relating  Eye contact:  Normal   Facial expression:  Sad   Attitude toward examiner:  Cooperative   Thought and  Language  Speech flow: Clear and Coherent   Thought content:  Delusions (Some thought blocking)   Preoccupation:  Obsessions; Somatic   Hallucinations:  Other (Comment)   Organization:  No data recorded  [employer] of Knowledge:  Fair   Intelligence:  Average   Abstraction:  Functional   Judgement:  Fair   Reality Testing:  Distorted   Insight:  Lacking   Decision Making:  Confused   Social Functioning  Social Maturity:  Responsible   Social Judgement:  Normal   Stress  Stressors:  Other (Comment)   Coping Ability:  Exhausted   Skill Deficits:  None   Supports:  Family     Religion: Religion/Spirituality Are You A Religious Person?: No  Leisure/Recreation: Leisure / Recreation Do You Have Hobbies?: No  Exercise/Diet: Exercise/Diet Do You Exercise?: No Have You Gained or Lost A Significant Amount of Weight in the Past Six Months?: No Do You Follow a Special Diet?: No Do You Have Any Trouble Sleeping?: Yes Explanation of Sleeping Difficulties: Patient reports not sleeping for months   CCA Employment/Education Employment/Work Situation: Employment / Work Situation Employment situation: On disability Why is patient on disability: [employer] and Mental Illness How long has patient been on disability: Unknown Patient's job has been impacted by current illness: No What is the longest time patient has a held a job?: Unknown Where was the patient employed at that time?: Unknown Has patient ever been in the [employer]?: No  Education: Education Is Patient Currently Attending School?: No   CCA Family/Childhood History Family and Relationship History: Family history Marital status: Single Are you sexually active?: No What is your sexual orientation?: Unknown Has your sexual activity been affected by drugs, alcohol, medication, or emotional stress?: None Does patient have children?:  (Unknown)  Childhood History:  Childhood History By  whom was/is the patient raised?: Mother Additional childhood history information: Unknown Description of patient's relationship with caregiver when they were a child: Unknown Patient's description of current relationship with people who raised him/her: Unknown How were you disciplined when you got in trouble as a child/adolescent?: Unknown Does patient have siblings?:  (Unknown) Did patient suffer any verbal/emotional/physical/sexual abuse as a child?:  (Unknown) Did patient suffer from severe childhood neglect?:  (Unknown) Has patient ever been sexually abused/assaulted/raped as an adolescent or adult?:  (Unknown) Was the patient ever a victim of a crime or a disaster?:  (Unknown) Witnessed domestic violence?:  (Unknown) Has patient been affected by domestic violence as an adult?:  (Unknown)  Child/Adolescent Assessment:     CCA Substance Use Alcohol/Drug Use: Alcohol / Drug Use Pain Medications: See MAR Prescriptions: See MAR Over the Counter: See MAR History of alcohol / drug use?: No history of alcohol / drug abuse  ASAM's:  Six Dimensions of Multidimensional Assessment  Dimension 1:  Acute Intoxication and/or Withdrawal Potential:      Dimension 2:  Biomedical Conditions and Complications:      Dimension 3:  Emotional, Behavioral, or Cognitive Conditions and Complications:     Dimension 4:  Readiness to Change:     Dimension 5:  Relapse, Continued use, or Continued Problem Potential:     Dimension 6:  Recovery/Living Environment:     ASAM Severity Score:    ASAM Recommended Level of Treatment:     Substance use Disorder (SUD)    Recommendations for Services/Supports/Treatments:   Per Psyc NP Rashaun Dixon patient is recommended for Inpatient Hospitalization   DSM5 Diagnoses: Patient Active Problem List   Diagnosis Date Noted  . 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 Bipolar I disorder,  most recent episode depressed with anxious distress (HCC) 08/06/2017  . ADHD (attention deficit hyperactivity disorder) 07/08/2017  . Personality disorder (HCC) 07/08/2017  . Insomnia 06/09/2017  . Aggression 01/31/2016  . 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 Autism spectrum disorder associated with known medical or genetic condition or environmental factor, requiring substantial support (level 2) 11/06/2015  . 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

## 2020-07-08 NOTE — ED Notes (Signed)
Pt her via EMS with c/o "something is wrong" pt here

## 2020-07-08 NOTE — ED Notes (Signed)
Pt continuing to refuse blood work, reports feeling more relaxed than before meds, pt encouraged to get some sleep

## 2020-07-08 NOTE — ED Notes (Signed)
Pt still refusing blood work, will readdress after meds on board, this RN talked to pt for about 45 mins attempting to convince pt that meds were a positive step in his treatment plan; pt repeats "ya'll aren't hearing me; something is wrong; more drugs aren't the answer"  Pt did eventually consent taking meds

## 2020-07-08 NOTE — ED Notes (Signed)
Patient very repetetive, states he was feeling like he is going to die, patient reports making a lot of pop corn in the microwave and feels like he has been exposed to radiation, states he feels like his body is hot. Patient has a difficult time expressing his needs and appears to have difficult putting his thoughts together. Lunch provided. Safety maintained. Calm and cooperative at present time.

## 2020-07-08 NOTE — ED Notes (Signed)
Patient awake denies SI/HI reports hearing voices but not able to explain what they say. Patient repetitive with stating he is not paranoid, He feels like there is something seriously wrong with him. Patient denied pain/.sob safety maintained will continue to monitor.

## 2020-07-08 NOTE — ED Notes (Signed)
Dinner tray given to patient

## 2020-07-08 NOTE — BH Assessment (Signed)
Patient to be reviewed with [facility] in the morning 07/08/20 for potential admission. If not appropriate or no beds available patient to be referred to additional hospitals 

## 2020-07-08 NOTE — ED Notes (Signed)
Patient up to bathroom, no behavioral issues, will continue to monitor.

## 2020-07-08 NOTE — ED Notes (Signed)

## 2020-07-09 ENCOUNTER — Other Ambulatory Visit: Payer: Self-pay

## 2020-07-09 DIAGNOSIS — R0789 Other chest pain: Secondary | ICD-10-CM | POA: Diagnosis not present

## 2020-07-09 MED ORDER — VENLAFAXINE HCL ER 75 MG PO CP24
150.0000 mg | ORAL_CAPSULE | Freq: Every day | ORAL | Status: DC
  Administered 2020-07-10: 150 mg via ORAL
  Filled 2020-07-09: qty 2

## 2020-07-09 MED ORDER — LORAZEPAM 2 MG/ML IJ SOLN
INTRAMUSCULAR | Status: AC
  Filled 2020-07-09: qty 1

## 2020-07-09 MED ORDER — ONDANSETRON 4 MG PO TBDP: 4.0000 mg | ORAL_TABLET | Freq: Once | ORAL | Status: AC

## 2020-07-09 MED ORDER — LORAZEPAM 2 MG/ML IJ SOLN: 2.0000 mg | Freq: Once | INTRAMUSCULAR | Status: DC

## 2020-07-09 MED ORDER — HALOPERIDOL LACTATE 5 MG/ML IJ SOLN: 5.0000 mg | Freq: Once | INTRAMUSCULAR | Status: DC

## 2020-07-09 MED ORDER — DIPHENHYDRAMINE HCL 50 MG/ML IJ SOLN: 50.0000 mg | Freq: Once | INTRAMUSCULAR | Status: DC

## 2020-07-09 MED ORDER — LORAZEPAM 2 MG/ML IJ SOLN
4.0000 mg | Freq: Once | INTRAMUSCULAR | Status: AC
  Administered 2020-07-09: 4 mg via INTRAMUSCULAR

## 2020-07-09 MED ORDER — HYDROXYZINE HCL 25 MG PO TABS
50.0000 mg | ORAL_TABLET | Freq: Once | ORAL | Status: AC
  Administered 2020-07-09: 50 mg via ORAL
  Filled 2020-07-09: qty 2

## 2020-07-09 MED ORDER — ZIPRASIDONE HCL 20 MG PO CAPS
40.0000 mg | ORAL_CAPSULE | Freq: Two times a day (BID) | ORAL | Status: DC
  Administered 2020-07-09 – 2020-07-10 (×2): 40 mg via ORAL
  Filled 2020-07-09 (×2): qty 2
  Filled 2020-07-09: qty 1

## 2020-07-09 MED ORDER — LORAZEPAM 2 MG/ML IJ SOLN: 4.0000 mg | Freq: Once | INTRAMUSCULAR | Status: DC

## 2020-07-09 MED ORDER — ONDANSETRON 4 MG PO TBDP
ORAL_TABLET | ORAL | Status: AC
  Administered 2020-07-09: 4 mg via ORAL
  Filled 2020-07-09: qty 1

## 2020-07-09 MED ORDER — CARBAMAZEPINE 200 MG PO TABS
100.0000 mg | ORAL_TABLET | Freq: Every day | ORAL | Status: DC
  Administered 2020-07-10: 100 mg via ORAL
  Filled 2020-07-09: qty 0.5
  Filled 2020-07-09: qty 1
  Filled 2020-07-09: qty 0.5

## 2020-07-09 MED ORDER — CARBAMAZEPINE 200 MG PO TABS
200.0000 mg | ORAL_TABLET | Freq: Every day | ORAL | Status: DC
  Administered 2020-07-09: 200 mg via ORAL

## 2020-07-09 NOTE — ED Notes (Signed)
Patient is IVC pending placement 

## 2020-07-09 NOTE — ED Notes (Signed)
Pt now calm, thanking staff for listening to him and "helping me".

## 2020-07-09 NOTE — ED Provider Notes (Signed)
Emergency Medicine Observation Re-evaluation Note  Grayton Lobo III is a 20 y.o. male, seen on rounds today.  Pt initially presented to the ED for complaints of Psychiatric Evaluation and Delusional Currently, the patient is resting, voices no medical complaint.  Physical Exam  BP (!) 148/90 (BP Location: Right Arm)   Pulse 82   Temp 98.6 F (37 C) (Oral)   Resp 18   Ht 6\' 3"  (1.905 m)   Wt 130 kg   SpO2 100%   BMI 35.82 kg/m  Physical Exam General: Resting in no acute distress Cardiac: No cyanosis Lungs: Equal rise and fall Psych: Not agitated  ED Course / MDM  EKG:    I have reviewed the labs performed to date as well as medications administered while in observation.  Recent changes in the last 24 hours include no events overnight.  Plan  Current plan is for psychiatric disposition. Patient is under full IVC at this time.   , MD 07/09/20 [phone]

## 2020-07-09 NOTE — ED Notes (Signed)
Pt awake, calm, resps unlabored, rate of 20. Pt informed to let staff know if he needs anything. Pt states "thanks again, it's really helping me".

## 2020-07-09 NOTE — ED Notes (Addendum)
Pt took medication willingly after approx 10 minutes of talking with multiple staff members and security. Pt informed that we are listening and taking him seriously. Pt informed that at this time he has all staff in this department here listening to him and are here to support him. Pt verbalizes understanding.

## 2020-07-09 NOTE — ED Notes (Signed)
Pt will not willingly take medication. Pt yelling at RN and pacing, attempts to reason with pt unsuccessful. Waiting on security

## 2020-07-09 NOTE — ED Notes (Signed)
Pt moving independently in bed intermittently. When checking on pt, pt states "yeah".

## 2020-07-09 NOTE — ED Notes (Signed)
Pt pacing, screaming, states "I know I am killing myself with radiation". Multiple avenues offered to reassure pt and attempt to calm pt. MD notified.

## 2020-07-09 NOTE — ED Notes (Signed)
Pt awake, calm, resps unlabored.

## 2020-07-09 NOTE — ED Notes (Signed)
Pts mother called and asked if pt could be discharged and her take him to [facility] once the roads clear up. Pts mother informed that pt is under IVC and we cannot let him leave. Pts mother asked if pt had been getting his medications. This RN reviewed pts medications ordered and pts home mediations had not been ordered. This RN sent a message to EDP and asked that he order pts home medications.

## 2020-07-09 NOTE — ED Notes (Signed)
Pt calls this RN for assistance. Pt states he just feels like something is wrong. Pt denies any pain. Pt states he has had several recent procedures that involved radiation and thinks he has been overexposed. Pt states he feels it may be too late and that something may already be wrong with his back. Pt is ambulatory in dayroom and moving all extremities freely.  Pt informed that with those procedures it is only a small amount of radiation. Pt not satisfied with that answer. Pt is calm but anxious. Pt informed that this RN would see what she could find out.

## 2020-07-09 NOTE — ED Notes (Signed)
Pt given meal tray.

## 2020-07-09 NOTE — BH Assessment (Addendum)
Referral Check:   .    [facility] [phone]) Per [place] [facility]), no appropriate bed available     [facility] ([phone]- [phone]), Kenard Gower reports denied due to no program for needs (Autism)   [facility] ([phone]--[phone]) Received after hours voicemail; unable to leave a message    [facility] ([phone]---[phone]) No answer   [facility] [phone], [phone], [phone] or [phone]) Jennette Kettle request re-fax to [phone]   [facility] [phone]), No answer    [facility] [phone] -or- [phone]), [name] reports full capacity today and due to weather possibly tomorrow    [facility] [phone]) Juanna Cao reports no intake specialist available   [facility] [phone]) Left voicemail    [phone] answer

## 2020-07-09 NOTE — ED Notes (Signed)
Pt up to restroom.

## 2020-07-09 NOTE — BH Assessment (Addendum)
Referral information for Psychiatric Hospitalization faxed to:  [facility] reports no appropriate beds available.  [facility] [phone]),   . [facility] ([phone]--[phone])  . [facility] [phone]), [place] requested refax of pt's covid results. Task completed at 4:23 am.  . [facility] [phone], [phone], [phone] or [phone]),   . [place] [facility] [phone] or [phone])  . [facility] [phone]),   . Abran Cantor [phone])

## 2020-07-09 NOTE — ED Notes (Signed)
Pt noted to appear anxious. Pt RN went over to speak with pt. Pt is concerned that he has a life threatening issue going on. Pt was informed that his blood work did not show anything concerning and that the CT scan that was done on 1/14 did not show anything either. Pt is very anxious, RN offered pt medication but pt refuses and states that this is not the issue. Pt is c/o the light bothering him. RN informed him that once we get area ready in [facility] that he will be moving over there so he will be in a more private area. Pt continues to states that he is worried that there is something else going on because he has had several CT scans recently. Pt was informed that CT scans deliver a very small dose of radiation and that they are safe to have and should not cause any issues. Pt continues to be anxious and continues to decline medication. EPD informed and spoke with pt as well.

## 2020-07-09 NOTE — ED Notes (Signed)
Pt ambulated to restroom without difficulty

## 2020-07-09 NOTE — ED Notes (Signed)
Pt has continued to endorse radiation overdose and feeling like he is dying.  Pt has been reassured throughout the day but has frequently cycled back to his delusions.

## 2020-07-10 DIAGNOSIS — R0789 Other chest pain: Secondary | ICD-10-CM | POA: Diagnosis not present

## 2020-07-10 DIAGNOSIS — F4325 Adjustment disorder with mixed disturbance of emotions and conduct: Secondary | ICD-10-CM | POA: Diagnosis not present

## 2020-07-10 MED ORDER — ACETAMINOPHEN 325 MG PO TABS
650.0000 mg | ORAL_TABLET | Freq: Four times a day (QID) | ORAL | Status: DC | PRN
  Administered 2020-07-10: 650 mg via ORAL
  Filled 2020-07-10: qty 2

## 2020-07-10 NOTE — ED Notes (Signed)
Report to wendy, rn

## 2020-07-10 NOTE — ED Provider Notes (Signed)
Emergency Medicine Observation Re-evaluation Note  Connor [name] is a 20 y.o. male, seen on rounds today.  Pt initially presented to the ED for complaints of Psychiatric Evaluation and Delusional Currently, the patient is calm cooperative, no distress.  Patient has no medical complaints today.Marland Kitchen  Physical Exam  BP (!) 144/84 (BP Location: Right Arm)   Pulse 92   Temp 98.3 F (36.8 C) (Oral)   Resp 18   Ht 6\' 3"  (1.905 m)   Wt 130 kg   SpO2 96%   BMI 35.82 kg/m  Physical Exam General: Calm, acting appropriate. Cardiac: Regular rate and rhythm around 90 bpm.  No murmur. Lungs: Clear to auscultation bilaterally. Psych: No acute distress somewhat flattened affect.  ED Course / MDM   No new labs over the past 24 hours.  Plan  Current plan is for inpatient admission.  Patient has been referred to facilities. Patient is under full IVC at this time.   , MD 07/10/20 1423

## 2020-07-10 NOTE — Consult Note (Signed)
[facility] Face-to-Face Psychiatry Consult   Reason for Consult: Consult for this 20 year old man with a history of autistic spectrum disorder who came in the hospital with anxious somatic symptoms Referring Physician: Paduchowski Patient Identification: Connor [name] MRN:  [id number] Principal Diagnosis: Adjustment disorder with mixed disturbance of emotions and conduct Diagnosis:  Principal Problem:   Adjustment disorder with mixed disturbance of emotions and conduct Active Problems:   Autism spectrum disorder associated with known medical or genetic condition or environmental factor, requiring substantial support (level 2)   Clinical von Recklinghausen's disease (HCC)   Total Time spent with patient: 1 hour  Subjective:   Connor [name] is a 20 y.o. male patient admitted with "I felt like something was wrong".  HPI: Patient seen chart reviewed.  20 year old man with a history of autistic spectrum disorder and impulsivity came to the hospital with a variety of poorly described somatic complaints including feeling sick to his stomach, feeling dizzy, feeling "not right".  He blames all of this on having been exposed to radiation.  When asked what kind of radiation he says it was because he had CT scans.  Evidently sometime over the last weeks or months he has had CT scans done to work-up some of his physical complaints.  He has now developed an anxiety that this is causing him some kind of harm.  Patient does not report hallucinations.  Does not appear to be frankly psychotic.  He denies any suicidal or homicidal thought.  There is no mention of him being aggressive or violent to his mother or anyone else as a result of this.  No substance abuse.  Past Psychiatric History: Patient has a past history of diagnosed autism associated with neurofibromatosis.  He had a previous hospitalization a couple years ago when he was 46 for aggression towards his mother.  He reports he has  not been doing any more that since then.  Risk to Self:   Risk to Others:   Prior Inpatient Therapy:   Prior Outpatient Therapy:    Past Medical History:  Past Medical History:  Diagnosis Date  . ADHD (attention deficit hyperactivity disorder)   . Autism   . Depressed   . Neurofibromatosis (HCC)    Type 1  . Obesity   . Pertussis    as a infant    Past Surgical History:  Procedure Laterality Date  . MRI    . RADIOLOGY WITH ANESTHESIA N/A 08/13/2012   Procedure: RADIOLOGY WITH ANESTHESIA;  Surgeon: Medication Radiologist, MD;  Location: [facility] OR;  Service: Radiology;  Laterality: N/A;  MRI    Family History:  Family History  Problem Relation Age of Onset  . Anxiety disorder Mother   . Depression Mother   . OCD Mother   . Hypertension Mother   . Cancer Maternal Aunt   . Cancer Maternal Grandmother   . Hypertension Father   . Schizophrenia Maternal Uncle    Family Psychiatric  History: Has an uncle with schizophrenia.  He also tells me that his mother has anxiety and mood problems Social History:  Social History   Substance and Sexual Activity  Alcohol Use No  . Alcohol/week: 0.0 standard drinks     Social History   Substance and Sexual Activity  Drug Use No    Social History   Socioeconomic History  . Marital status: Single    Spouse name: Not on file  . Number of children: Not on file  . Years of education:  Not on file  . Highest education level: Not on file  Occupational History  . Not on file  Tobacco Use  . Smoking status: Former Smoker    Packs/day: 1.00    Years: 1.00    Pack years: 1.00    Types: Cigarettes    Quit date: 12/31/2014    Years since quitting: 5.5  . Smokeless tobacco: Never Used  Substance and Sexual Activity  . Alcohol use: No    Alcohol/week: 0.0 standard drinks  . Drug use: No  . Sexual activity: Not Currently    Birth control/protection: None  Other Topics Concern  . Not on file  Social History Narrative   [name] is a high  school drop out.   He lives with his mom only. He has one sister.   He enjoys eating, sleeping, and watching tv.   Social Determinants of Health   Financial Resource Strain: Not on file  Food Insecurity: Not on file  Transportation Needs: Not on file  Physical Activity: Not on file  Stress: Not on file  Social Connections: Not on file   Additional Social History:    Allergies:  No Known Allergies  Labs:  Results for orders placed or performed during the hospital encounter of 07/07/20 (from the past 48 hour(s))  Comprehensive metabolic panel     Status: None   Collection Time: 07/08/20 10:13 PM  Result Value Ref Range   Sodium 140 135 - 145 mmol/L   Potassium 3.5 3.5 - 5.1 mmol/L   Chloride 101 98 - 111 mmol/L   CO2 30 22 - 32 mmol/L   Glucose, Bld 94 70 - 99 mg/dL    Comment: Glucose reference range applies only to samples taken after fasting for at least 8 hours.   BUN 14 6 - 20 mg/dL   Creatinine, Ser 5.42 0.61 - 1.24 mg/dL   Calcium 9.7 8.9 - 70.6 mg/dL   Total Protein 8.0 6.5 - 8.1 g/dL   Albumin 4.6 3.5 - 5.0 g/dL   AST 17 15 - 41 U/L   ALT 21 0 - 44 U/L   Alkaline Phosphatase 64 38 - 126 U/L   Total Bilirubin 0.6 0.3 - 1.2 mg/dL   GFR, Estimated >23 >76 mL/min    Comment: (NOTE) Calculated using the CKD-EPI Creatinine Equation (2021)    Anion gap 9 5 - 15    Comment: Performed at [facility], [address] [place], [place] [address]  cbc     Status: None   Collection Time: 07/08/20 10:13 PM  Result Value Ref Range   WBC 7.4 4.0 - 10.5 K/uL   RBC 5.66 4.22 - 5.81 MIL/uL   Hemoglobin 16.5 13.0 - 17.0 g/dL   HCT 17.6 16.0 - 73.7 %   MCV 88.0 80.0 - 100.0 fL   MCH 29.2 26.0 - 34.0 pg   MCHC 33.1 30.0 - 36.0 g/dL   RDW 10.6 26.9 - 48.5 %   Platelets 332 150 - 400 K/uL   nRBC 0.0 0.0 - 0.2 %    Comment: Performed at [name], [address], [place], [place] [address]    Current Facility-Administered Medications  Medication Dose  Route Frequency Provider Last Rate Last Admin  . acetaminophen (TYLENOL) tablet 650 mg  650 mg Oral Q6H PRN Minna Antis, MD   650 mg at 07/10/20 1106  . carbamazepine (TEGRETOL) tablet 100 mg  100 mg Oral Q breakfast Chesley Noon, MD   100 mg  at 07/10/20 0910  . carbamazepine (TEGRETOL) tablet 200 mg  200 mg Oral QHS Chesley Noon, MD   200 mg at 07/09/20 2046  . venlafaxine XR (EFFEXOR-XR) 24 hr capsule 150 mg  150 mg Oral Q breakfast Chesley Noon, MD   150 mg at 07/10/20 0910  . ziprasidone (GEODON) capsule 40 mg  40 mg Oral BID WC Chesley Noon, MD   40 mg at 07/10/20 3149   Current Outpatient Medications  Medication Sig Dispense Refill  . omeprazole (PRILOSEC) 20 MG capsule Take 20 mg by mouth daily.    . ziprasidone (GEODON) 40 MG capsule Take 40 mg by mouth 2 (two) times daily with a meal.      Musculoskeletal: Strength & Muscle Tone: within normal limits Gait & Station: normal Patient leans: N/A  Psychiatric Specialty Exam: Physical Exam Vitals and nursing note reviewed.  Constitutional:      Appearance: He is well-developed and well-nourished.  HENT:     Head: Normocephalic and atraumatic.  Eyes:     Conjunctiva/sclera: Conjunctivae normal.     Pupils: Pupils are equal, round, and reactive to light.  Cardiovascular:     Heart sounds: Normal heart sounds.  Pulmonary:     Effort: Pulmonary effort is normal.  Abdominal:     Palpations: Abdomen is soft.  Musculoskeletal:        General: Normal range of motion.     Cervical back: Normal range of motion.  Skin:    General: Skin is warm and dry.  Neurological:     General: No focal deficit present.     Mental Status: He is alert.  Psychiatric:        Attention and Perception: He is inattentive.        Mood and Affect: Mood is anxious.        Speech: Speech normal.        Behavior: Behavior is cooperative.        Thought Content: Thought content normal.        Cognition and Memory: Cognition is  impaired.        Judgment: Judgment is inappropriate.     Review of Systems  Constitutional: Negative.   HENT: Negative.   Eyes: Negative.   Respiratory: Negative.   Cardiovascular: Negative.   Gastrointestinal: Negative.   Musculoskeletal: Negative.   Skin: Negative.   Neurological: Negative.   Psychiatric/Behavioral: Positive for confusion. Negative for self-injury, sleep disturbance and suicidal ideas. The patient is nervous/anxious.     Blood pressure (!) 144/84, pulse 92, temperature 98.3 F (36.8 C), temperature source Oral, resp. rate 18, height 6\' 3"  (1.905 m), weight 130 kg, SpO2 96 %.Body mass index is 35.82 kg/m.  General Appearance: Casual  Eye Contact:  Fair  Speech:  Clear and Coherent  Volume:  Normal  Mood:  Anxious  Affect:  Constricted  Thought Process:  Coherent  Orientation:  Full (Time, Place, and Person)  Thought Content:  Paranoid Ideation, Rumination and Tangential  Suicidal Thoughts:  No  Homicidal Thoughts:  No  Memory:  Immediate;   Fair Recent;   Fair Remote;   Fair  Judgement:  Impaired  Insight:  Shallow  Psychomotor Activity:  Restlessness  Concentration:  Concentration: Poor  Recall:  Poor  Fund of Knowledge:  Fair  Language:  Fair  Akathisia:  No  Handed:  Right  AIMS (if indicated):     Assets:  Desire for Improvement Housing Physical Health Resilience Social Support  ADL's:  Impaired  Cognition:  Impaired,  Mild  Sleep:        Treatment Plan Summary: Plan 20 year old man who seems to have developed somewhat obsessive like anxieties related to his recent work-up for physical symptoms.  There is no report of a suicidal or homicidal thoughts or behavior and the patient does not seem like he is truly psychotic.  He did seem to listen and incorporate it when I told him that the amount of radiation from CT scans is tiny and is in fact even less than what you get from a chest x-ray.  I reassured him that the amount of radiation  exposure he got from his CT scans was not at all likely to cause him any physical symptoms and that the complaints he was having were not consistent with that anyway.  No sign of dangerousness.  Not committable.  Patient will be discharged from the emergency room after IVC is discontinued and is to follow-up with his outpatient psychiatrist as usual with no change to medicine.  Disposition: No evidence of imminent risk to self or others at present.   Patient does not meet criteria for psychiatric inpatient admission. Supportive therapy provided about ongoing stressors. Discussed crisis plan, support from social network, calling 911, coming to the Emergency Department, and calling Suicide Hotline.  Mordecai RasmussenJohn Garron Eline, MD 07/10/2020 3:14 PM

## 2020-07-10 NOTE — ED Notes (Signed)
Patient voices understanding of discharge instructions, no signs of distress, all belongings given back to patient, Nurse talked to Patient's mom and she is going to pick  Him up to transfer home, Patient states " I'm so glad I get to leave"

## 2020-07-10 NOTE — ED Notes (Signed)
Patient's MOM called, she wanted to talk with him, but He refused at this time, said He would call her later, patient is calm and cooperative at this time, will continue to monitor.

## 2020-07-10 NOTE — ED Notes (Signed)
Patient is taking a shower, all hygiene items were given to him.

## 2020-07-10 NOTE — ED Notes (Signed)
Pt sleeping, resps unlabored.  

## 2020-07-10 NOTE — Discharge Instructions (Addendum)
You have been seen in the emergency department for a  psychiatric concern. You have been evaluated both medically as well as psychiatrically. Please follow-up with your outpatient resources provided. Return to the emergency department for any worsening symptoms, or any thoughts of hurting yourself or anyone else so that we may attempt to help you. 

## 2020-07-10 NOTE — ED Notes (Signed)
Patient is up to the bathroom, no signs of distress, He is calm and pleasant, will continue to monitor.

## 2020-07-10 NOTE — ED Notes (Signed)
VOL, papers rescinded pending D/C

## 2020-07-10 NOTE — ED Provider Notes (Signed)
-----------------------------------------   2:50 PM on 07/10/2020 -----------------------------------------  Patient has been seen and evaluated by psychiatry.  They believe the patient is safe for discharge home from a psychiatric standpoint.  State the patient has outpatient resources available.  Patient's medical work-up has been largely nonrevealing.   Minna Antis, MD 07/10/20 1451

## 2020-07-11 ENCOUNTER — Ambulatory Visit: Payer: Medicaid Other | Admitting: Gastroenterology

## 2020-07-11 DIAGNOSIS — F4310 Post-traumatic stress disorder, unspecified: Secondary | ICD-10-CM | POA: Insufficient documentation

## 2020-07-11 DIAGNOSIS — F339 Major depressive disorder, recurrent, unspecified: Secondary | ICD-10-CM | POA: Insufficient documentation

## 2020-07-12 ENCOUNTER — Emergency Department
Admission: EM | Admit: 2020-07-12 | Discharge: 2020-07-12 | Disposition: A | Discharge status: Home / Self Care | Payer: Medicare Other | Attending: Emergency Medicine | Admitting: Emergency Medicine

## 2020-07-12 ENCOUNTER — Other Ambulatory Visit: Payer: Self-pay

## 2020-07-12 DIAGNOSIS — Z87891 Personal history of nicotine dependence: Secondary | ICD-10-CM | POA: Diagnosis not present

## 2020-07-12 DIAGNOSIS — R059 Cough, unspecified: Secondary | ICD-10-CM | POA: Insufficient documentation

## 2020-07-12 DIAGNOSIS — R6889 Other general symptoms and signs: Secondary | ICD-10-CM

## 2020-07-12 DIAGNOSIS — R5381 Other malaise: Secondary | ICD-10-CM | POA: Diagnosis not present

## 2020-07-12 DIAGNOSIS — R69 Illness, unspecified: Secondary | ICD-10-CM | POA: Diagnosis present

## 2020-07-12 NOTE — ED Provider Notes (Signed)
[facility] Emergency Department Provider Note  ____________________________________________   Event Date/Time   First MD Initiated Contact with Patient 07/12/20 1953     (approximate)  I have reviewed the triage vital signs and the nursing notes.   HISTORY  Chief Complaint Medical Clearance (Patient believes he has been poisoned with radiation)   HPI Connor [name] is a 20 y.o. male with a past medical history of ADHD, autism spectrum disorder, depression, neurofibromatosis type I, obesity, and adjustment disorder who presents voluntarily requesting assessment stating he feels like he is dying because of her radiation exposure.  He states he had an x-ray last week and thinks this may have caused him to become sick.  He states everything seems little brighter than usual.  He also endorses a cough for several months that has not changed at all today.  He denies any other focal symptoms including headache, earache, sore throat, chest pain, shortness of breath, Donnell pain, nausea, vomiting, diarrhea, dysuria, rash or recent falls or injuries.  He denies tobacco abuse, EtOH use or illicit drug use.  States he is taking all his medications as directed.  He denies any SI, HI or auditory or visual hallucinations.         Past Medical History:  Diagnosis Date  . ADHD (attention deficit hyperactivity disorder)   . Autism   . Depressed   . Neurofibromatosis (HCC)    Type 1  . Obesity   . Pertussis    as a infant    Patient Active Problem List   Diagnosis Date Noted  . 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 Bipolar I disorder, most recent episode depressed with anxious distress (HCC) 08/06/2017  . ADHD (attention deficit hyperactivity disorder) 07/08/2017  . Personality disorder (HCC) 07/08/2017  . Insomnia 06/09/2017  . Aggression 01/31/2016  . 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 Autism  spectrum disorder associated with known medical or genetic condition or environmental factor, requiring substantial support (level 2) 11/06/2015  . 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 MRI    . RADIOLOGY WITH ANESTHESIA N/A 08/13/2012   Procedure: RADIOLOGY WITH ANESTHESIA;  Surgeon: Medication Radiologist, MD;  Location: [facility] OR;  Service: Radiology;  Laterality: N/A;  MRI     Prior to Admission medications   Medication Sig Start Date End Date Taking? Authorizing Provider  omeprazole (PRILOSEC) 20 MG capsule Take 20 mg by mouth daily. 07/05/20   [provider]  ziprasidone (GEODON) 40 MG capsule Take 40 mg by mouth 2 (two) times daily with a meal.    [provider]    Allergies Patient has no known allergies.  Family History  Problem Relation Age of Onset  . Anxiety disorder Mother   . Depression Mother   . OCD Mother   . Hypertension Mother   . Cancer Maternal Aunt   . Cancer Maternal Grandmother   . Hypertension Father   . Schizophrenia Maternal Uncle     Social History Social History   Tobacco Use  . Smoking status: Former Smoker    Packs/day: 1.00    Years: 1.00    Pack years: 1.00    Types: Cigarettes    Quit date: 12/31/2014    Years since quitting: 5.5  . Smokeless tobacco: Never Used  Substance Use Topics  .  Alcohol use: No    Alcohol/week: 0.0 standard drinks  . Drug use: No    Review of Systems  Review of Systems  Constitutional: Positive for malaise/fatigue. Negative for chills and fever.  HENT: Negative for sore throat.   Eyes: Negative for pain.  Respiratory: Positive for cough. Negative for stridor.   Cardiovascular: Negative for chest pain.  Gastrointestinal: Negative for vomiting.  Genitourinary: Negative for dysuria.  Musculoskeletal: Negative for myalgias.  Skin: Negative  for rash.  Neurological: Negative for seizures, loss of consciousness and headaches.  Psychiatric/Behavioral: Negative for suicidal ideas.  All other systems reviewed and are negative.    ____________________________________________   PHYSICAL EXAM:  VITAL SIGNS: ED Triage Vitals  Enc Vitals Group     BP      Pulse      Resp      Temp      Temp src      SpO2      Weight      Height      Head Circumference      Peak Flow      Pain Score      Pain Loc      Pain Edu?      Excl. in GC?    Vitals:   07/12/20 2001  BP: 140/87  Pulse: 96  Resp: 20  Temp: 98.7 F (37.1 C)  SpO2: 97%   Physical Exam Vitals and nursing note reviewed.  Constitutional:      Appearance: He is well-developed and well-nourished. He is obese.  HENT:     Head: Normocephalic and atraumatic.     Right Ear: External ear normal.     Left Ear: External ear normal.     Nose: Nose normal.     Mouth/Throat:     Mouth: Mucous membranes are moist.  Eyes:     Conjunctiva/sclera: Conjunctivae normal.  Cardiovascular:     Rate and Rhythm: Normal rate and regular rhythm.     Heart sounds: No murmur heard.   Pulmonary:     Effort: Pulmonary effort is normal. No respiratory distress.     Breath sounds: Normal breath sounds.  Abdominal:     Palpations: Abdomen is soft.     Tenderness: There is no abdominal tenderness.  Musculoskeletal:        General: No edema.     Cervical back: Neck supple.  Skin:    General: Skin is warm and dry.     Capillary Refill: Capillary refill takes less than 2 seconds.  Neurological:     Mental Status: He is alert and oriented to person, place, and time.     Cranial Nerves: No cranial nerve deficit.  Psychiatric:        Mood and Affect: Mood and affect and mood normal.        Thought Content: Thought content does not include homicidal or suicidal ideation. Thought content does not include homicidal or suicidal plan.        Cognition and Memory: Cognition is  impaired.     Lungs clear bilaterally.  Patient is oriented.  Cranial nerves II through XII grossly intact.  Patient has symmetric strength in all extremities and is able to ambulate with steady gait unassisted.   ____________________________________________   LABS (all labs ordered are listed, but only abnormal results are displayed)  Labs Reviewed - No data to display ____________________________________________    ____________________________________________   INITIAL IMPRESSION / ASSESSMENT AND PLAN / ED COURSE  Patient presents with above to history exam for assessment of concern that he is dying from possible radiation exposure from recent x-ray.  On arrival he is afebrile hemodynamically stable.  His lungs are clear bilaterally.  He is not suicidal or homicidal and does not appear acutely psychotic.  Suspect this may be a delusion although I do not believe patient is acutely dangerous to himself or others.  An extensive discussion with patient's mother and reviewed his recent visit to [facility] yesterday as well as his visit to this emergency room 2 days ago patient was seen for similar complaints.  Both times patient was seen by psychiatry and discharged.  Patient's mother states he does seem fixated on possible radiation exposure she is not sure what else to do.  She does note she has an appointment with psychiatrist this coming week.  I did review patient's BMP, acetaminophen level, salicylate level, ethanol level, TSH and CBC obtained yesterday at [facility] that were all unremarkable.  Very low suspicion for any acute metabolic derangement or other organic etiology contributing to patient's concerns at this time.  Do believe he is safe for discharge with plan for outpatient psychiatry and PCP follow-up.  Patient's mother states she does feel safe with him at home.  I explained to follow patient.  Discharged stable condition.  Strict return precautions advised and discussed.          ____________________________________________   FINAL CLINICAL IMPRESSION(S) / ED DIAGNOSES  Final diagnoses:  Feeling bad    Medications - No data to display   ED Discharge Orders    None       Note:  This document was prepared using Dragon voice recognition software and may include unintentional dictation errors.   Gilles Chiquito, MD 07/12/20 2056

## 2020-07-12 NOTE — ED Notes (Signed)
Patient's mother called to come pick patient up for DC.

## 2020-07-12 NOTE — ED Triage Notes (Signed)
Patient from home via [facility]. Patient's mother called and on scene. Patient very anxious stating that he believes he has been poisoned via radiation after having a CXR last week. Patient states "I don't feel quite right, feels like I am burning for several days after being exposed to radiation". Patient states he has been "confused, dizzy, fatigued, and has had a cough". Patient states "lights are really bright and sounds are really soundy".  Patient denies drug or alcohol use. Denies SI/HI, denies AVH.

## 2020-07-13 ENCOUNTER — Emergency Department
Admission: EM | Admit: 2020-07-13 | Discharge: 2020-07-13 | Disposition: A | Discharge status: Home / Self Care | Payer: Medicare Other | Attending: Emergency Medicine | Admitting: Emergency Medicine

## 2020-07-13 ENCOUNTER — Other Ambulatory Visit: Payer: Self-pay

## 2020-07-13 DIAGNOSIS — R4182 Altered mental status, unspecified: Secondary | ICD-10-CM | POA: Diagnosis not present

## 2020-07-13 DIAGNOSIS — F840 Autistic disorder: Secondary | ICD-10-CM | POA: Diagnosis not present

## 2020-07-13 DIAGNOSIS — F609 Personality disorder, unspecified: Secondary | ICD-10-CM | POA: Diagnosis not present

## 2020-07-13 DIAGNOSIS — Z20822 Contact with and (suspected) exposure to covid-19: Secondary | ICD-10-CM | POA: Diagnosis not present

## 2020-07-13 DIAGNOSIS — F3130 Bipolar disorder, current episode depressed, mild or moderate severity, unspecified: Secondary | ICD-10-CM | POA: Insufficient documentation

## 2020-07-13 DIAGNOSIS — F4325 Adjustment disorder with mixed disturbance of emotions and conduct: Secondary | ICD-10-CM | POA: Diagnosis present

## 2020-07-13 DIAGNOSIS — R208 Other disturbances of skin sensation: Secondary | ICD-10-CM | POA: Diagnosis present

## 2020-07-13 DIAGNOSIS — R4589 Other symptoms and signs involving emotional state: Secondary | ICD-10-CM | POA: Insufficient documentation

## 2020-07-13 DIAGNOSIS — F909 Attention-deficit hyperactivity disorder, unspecified type: Secondary | ICD-10-CM | POA: Diagnosis present

## 2020-07-13 DIAGNOSIS — G4700 Insomnia, unspecified: Secondary | ICD-10-CM | POA: Diagnosis present

## 2020-07-13 DIAGNOSIS — Z87891 Personal history of nicotine dependence: Secondary | ICD-10-CM | POA: Diagnosis not present

## 2020-07-13 DIAGNOSIS — R6889 Other general symptoms and signs: Secondary | ICD-10-CM

## 2020-07-13 LAB — COMPREHENSIVE METABOLIC PANEL
ALT: 34 U/L (ref 0–44)
AST: 38 U/L (ref 15–41)
Albumin: 4.9 g/dL (ref 3.5–5.0)
Alkaline Phosphatase: 59 U/L (ref 38–126)
Anion gap: 14 (ref 5–15)
BUN: 11 mg/dL (ref 6–20)
CO2: 23 mmol/L (ref 22–32)
Calcium: 9.4 mg/dL (ref 8.9–10.3)
Chloride: 100 mmol/L (ref 98–111)
Creatinine, Ser: 0.88 mg/dL (ref 0.61–1.24)
GFR, Estimated: >60 mL/min (ref >60)
Glucose, Bld: 86 mg/dL (ref 70–99)
Potassium: 4.1 mmol/L (ref 3.5–5.1)
Sodium: 137 mmol/L (ref 135–145)
Total Bilirubin: 1.7 mg/dL — ABNORMAL HIGH (ref 0.3–1.2)
Total Protein: 8.3 g/dL — ABNORMAL HIGH (ref 6.5–8.1)

## 2020-07-13 LAB — CBC
HCT: 46.6 % (ref 39.0–52.0)
Hemoglobin: 16 g/dL (ref 13.0–17.0)
MCH: 29.4 pg (ref 26.0–34.0)
MCHC: 34.3 g/dL (ref 30.0–36.0)
MCV: 85.5 fL (ref 80.0–100.0)
Platelets: 321 10*3/uL (ref 150–400)
RBC: 5.45 MIL/uL (ref 4.22–5.81)
RDW: 12.4 % (ref 11.5–15.5)
WBC: 6.2 10*3/uL (ref 4.0–10.5)
nRBC: 0 % (ref 0.0–0.2)

## 2020-07-13 LAB — URINALYSIS, COMPLETE (UACMP) WITH MICROSCOPIC
Bilirubin Urine: NEGATIVE
Glucose, UA: NEGATIVE mg/dL
Hgb urine dipstick: NEGATIVE
Ketones, ur: 80 mg/dL — AB
Leukocytes,Ua: NEGATIVE
Nitrite: NEGATIVE
Protein, ur: 30 mg/dL — AB
Specific Gravity, Urine: 1.031 — ABNORMAL HIGH (ref 1.005–1.030)
pH: 5 (ref 5.0–8.0)

## 2020-07-13 LAB — ACETAMINOPHEN LEVEL: Acetaminophen (Tylenol), Serum: <10 ug/mL — ABNORMAL LOW (ref 10–30)

## 2020-07-13 LAB — RESP PANEL BY RT-PCR (FLU A&B, COVID) ARPGX2
Influenza A by PCR: NEGATIVE
Influenza B by PCR: NEGATIVE
SARS Coronavirus 2 by RT PCR: NEGATIVE

## 2020-07-13 LAB — SALICYLATE LEVEL: Salicylate Lvl: <7 mg/dL — ABNORMAL LOW (ref 7.0–30.0)

## 2020-07-13 LAB — ETHANOL: Alcohol, Ethyl (B): <10 mg/dL (ref <10)

## 2020-07-13 NOTE — ED Provider Notes (Addendum)
[facility] Emergency Department Provider Note  ____________________________________________   Event Date/Time   First MD Initiated Contact with Patient 07/13/20 1630     (approximate)  I have reviewed the triage vital signs and the nursing notes.   HISTORY  Chief Complaint Altered Mental Status   HPI Connor [name] is a 20 y.o. male with a past medical history of ADHD, autism spectrum disorder, depression, neurofibromatosis type I, obesity and adjustment disorder who presents for assessment stating "I feel like I am dying and everything is very bright and I am burning from head to toe".  Is very concerned that he was exposed from radiation 1 week ago and that he has radiation poisoning.  Of note he was seen by this examiner yesterday for same complaint.  He states nothing has changed and he denies any other acute physical symptoms including headache, earache, sore throat, vision changes, cough, nausea, vomiting, diarrhea or dysuria, rash, focal extremity pain recent traumatic injuries or falls or any other clear focal symptoms.  He denies any SI or HI.  Denies any auditory visual hallucinations.  Denies any EtOH use illicit drug use or tobacco abuse.         Past Medical History:  Diagnosis Date  . ADHD (attention deficit hyperactivity disorder)   . Autism   . Depressed   . Neurofibromatosis (HCC)    Type 1  . Obesity   . Pertussis    as a infant    Patient Active Problem List   Diagnosis Date Noted  . 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 Bipolar I disorder, most recent episode depressed with anxious distress (HCC) 08/06/2017  . ADHD (attention deficit hyperactivity disorder) 07/08/2017  . Personality disorder (HCC) 07/08/2017  . Insomnia 06/09/2017  . Aggression 01/31/2016  . 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 Autism spectrum disorder associated with known medical or genetic  condition or environmental factor, requiring substantial support (level 2) 11/06/2015  . 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 MRI    . RADIOLOGY WITH ANESTHESIA N/A 08/13/2012   Procedure: RADIOLOGY WITH ANESTHESIA;  Surgeon: Medication Radiologist, MD;  Location: [facility] OR;  Service: Radiology;  Laterality: N/A;  MRI     Prior to Admission medications   Medication Sig Start Date End Date Taking? Authorizing Provider  omeprazole (PRILOSEC) 20 MG capsule Take 20 mg by mouth daily. 07/05/20   [provider]  ziprasidone (GEODON) 40 MG capsule Take 40 mg by mouth 2 (two) times daily with a meal.    [provider]    Allergies Patient has no known allergies.  Family History  Problem Relation Age of Onset  . Anxiety disorder Mother   . Depression Mother   . OCD Mother   . Hypertension Mother   . Cancer Maternal Aunt   . Cancer Maternal Grandmother   . Hypertension Father   . Schizophrenia Maternal Uncle     Social History Social History   Tobacco Use  . Smoking status: Former Smoker    Packs/day: 1.00    Years: 1.00    Pack years: 1.00    Types: Cigarettes    Quit date: 12/31/2014    Years since quitting: 5.5  . Smokeless tobacco: Never Used  Substance Use Topics  . Alcohol use: No    Alcohol/week:  0.0 standard drinks  . Drug use: No    Review of Systems  Review of Systems  Constitutional: Negative for chills and fever.  HENT: Negative for sore throat.   Eyes: Negative for pain.  Respiratory: Negative for cough and stridor.   Cardiovascular: Negative for chest pain.  Gastrointestinal: Negative for vomiting.  Genitourinary: Negative for dysuria.  Musculoskeletal: Negative for back pain, joint pain and neck pain.  Skin: Negative for rash.  Neurological: Negative for seizures, loss of consciousness and  headaches.  Psychiatric/Behavioral: Negative for suicidal ideas.  All other systems reviewed and are negative.     ____________________________________________   PHYSICAL EXAM:  VITAL SIGNS: ED Triage Vitals  Enc Vitals Group     BP 07/13/20 1058 (!) 136/98     Pulse Rate 07/13/20 1058 (!) 103     Resp 07/13/20 1058 18     Temp 07/13/20 1058 98.8 F (37.1 C)     Temp Source 07/13/20 1058 Oral     SpO2 07/13/20 1058 97 %     Weight 07/13/20 1100 297 lb 9.6 oz (135 kg)     Height 07/13/20 1100 6\' 3"  (1.905 m)     Head Circumference --      Peak Flow --      Pain Score 07/13/20 1059 7     Pain Loc --      Pain Edu? --      Excl. in GC? --    Vitals:   07/13/20 1347 07/13/20 1532  BP: (!) 155/104 139/86  Pulse: (!) 127 100  Resp: 18 18  Temp: 98.8 F (37.1 C)   SpO2: 96% 97%   Physical Exam Vitals and nursing note reviewed.  Constitutional:      Appearance: He is well-developed and well-nourished. He is obese.  HENT:     Head: Normocephalic and atraumatic.     Right Ear: External ear normal.     Left Ear: External ear normal.  Eyes:     Conjunctiva/sclera: Conjunctivae normal.  Cardiovascular:     Rate and Rhythm: Normal rate and regular rhythm.     Heart sounds: No murmur heard.   Pulmonary:     Effort: Pulmonary effort is normal. No respiratory distress.     Breath sounds: Normal breath sounds.  Abdominal:     Palpations: Abdomen is soft.     Tenderness: There is no abdominal tenderness.  Musculoskeletal:        General: No edema.     Cervical back: Neck supple.     Right lower leg: No edema.     Left lower leg: No edema.  Skin:    General: Skin is warm and dry.  Neurological:     Mental Status: He is alert.  Psychiatric:        Mood and Affect: Mood and affect normal.     Cranial nerves II through XII grossly intact.  Patient has full symmetric strength in all extremities.  Patient has steady gait ambulating  unassisted. ____________________________________________   LABS (all labs ordered are listed, but only abnormal results are displayed)  Labs Reviewed  COMPREHENSIVE METABOLIC PANEL - Abnormal; Notable for the following components:      Result Value   Total Protein 8.3 (*)    Total Bilirubin 1.7 (*)    All other components within normal limits  URINALYSIS, COMPLETE (UACMP) WITH MICROSCOPIC - Abnormal; Notable for the following components:   Color, Urine YELLOW (*)    APPearance HAZY (*)  Specific Gravity, Urine 1.031 (*)    Ketones, ur 80 (*)    Protein, ur 30 (*)    Bacteria, UA RARE (*)    All other components within normal limits  ACETAMINOPHEN LEVEL - Abnormal; Notable for the following components:   Acetaminophen (Tylenol), Serum <10 (*)    All other components within normal limits  SALICYLATE LEVEL - Abnormal; Notable for the following components:   Salicylate Lvl <7.0 (*)    All other components within normal limits  RESP PANEL BY RT-PCR (FLU A&B, COVID) ARPGX2  CBC  ETHANOL  URINE DRUG SCREEN, QUALITATIVE ([facility] ONLY)   ____________________________________________  ____________________________________________   PROCEDURES  Procedure(s) performed (including Critical Care):  Procedures   ____________________________________________   INITIAL IMPRESSION / ASSESSMENT AND PLAN / ED COURSE        Patient presents with [place] today history exam after being seen by this examiner yesterday for same complaint of concerns for possible radiation exposure and his vision feeling brighter than usual and feeling like he is dying.  He is afebrile and hemodynamically stable on arrival.  He has a nonfocal neuro exam.  Very low suspicion for acute traumatic injury, acute infectious process, significant metabolic derangement, toxic ingestion, or acute thyroid emergency contributing to patient's symptoms at this time.  As discussed yesterday patient has been seen in multiple  emergency rooms over the last week with medical work-ups that were normal.  He is not suicidal homicidal.  It seems patient may be suffering from delusion.  He does not seem to have any clear secondary gain and given I am concerned he may immediately return to this emergency room or another if he is discharged we will place him in psych labs and consult TTS and psych.  Routine psych screening labs were sent.  The patient has been placed in psychiatric observation due to the need to provide a safe environment for the patient while obtaining psychiatric consultation and evaluation, as well as ongoing medical and medication management to treat the patient's condition.  The patient has not been placed under full IVC at this time.  Patient seen and cleared for discharge by psychiatry.  Discharged stable condition.       ____________________________________________   FINAL CLINICAL IMPRESSION(S) / ED DIAGNOSES  Final diagnoses:  Feeling bad    Medications - No data to display   ED Discharge Orders    None       Note:  This document was prepared using Dragon voice recognition software and may include unintentional dictation errors.   Gilles Chiquito, MD 07/13/20 1719    Gilles Chiquito, MD 07/13/20 1726

## 2020-07-13 NOTE — ED Triage Notes (Signed)
First Nurse Note:  Arrives via EMS c/o 'radiation exposure' from CT and Xray done on Friday.  Has been seen for same several times since. Only complaint is photosensitivity.  VS wnl.

## 2020-07-13 NOTE — ED Notes (Signed)
Pt signed paper-copy of d/c paper. Paper placed in tray to go to medical records.

## 2020-07-13 NOTE — ED Notes (Signed)
Pt visualized pacing in room and looking out window at this time.

## 2020-07-13 NOTE — BH Assessment (Signed)
Comprehensive Clinical Assessment (CCA) Note  07/13/2020 Connor [name] [id number]   Connor [name], 20 year old male who presents to [facility] ED voluntarily for treatment. 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 Pt c/o feeling confused, dizziness, body aches, weight loss, loss of appetite, "I feel like something is very wrong.   During TTS assessment pt presents alert and oriented x 4, anxious but cooperative, and mood-congruent with affect. The pt does not appear to be responding to internal or external stimuli. Neither is the pt presenting with any delusional thinking. Pt verified the information provided to triage RN.   Pt identifies his main complaint to be that after receiving radiation/CT scan a few weeks ago he is experiencing "brain pain and a burning sensation throughout his entire body." Patient is fixed on having these symptoms and believes he is dying. The psychiatrist was present and explained there were no physical issues to be concerned about and offered medications to assist with anxiety. Patient was not receptive and declined all suggestions. Pt denies SA. Pt denies SI/HI/AH/VH.   Per Dr. Toni Amend, there is no evidence of imminent risk to self or others at present. Patient does not meet criteria for psychiatric inpatient admission.    Chief Complaint:  Chief Complaint  Patient presents with  . Altered Mental Status   Visit Diagnosis: Autism Spectrum Disorder associated with known medical or genetic condition or environmental factor, requiring substantial support (level 2)    CCA Screening, Triage and Referral (STR)  Patient Reported Information How did you hear about [place]? Self  Referral name: No data recorded Referral phone number: No data recorded  Whom do you see for routine medical problems? Primary Care  Practice/Facility Name: No data recorded Practice/Facility Phone Number: No data  recorded Name of Contact: No data recorded Contact Number: No data recorded Contact Fax Number: No data recorded Prescriber Name: No data recorded Prescriber Address (if known): No data recorded  What Is the Reason for Your Visit/Call Today? Patient reports he has medical issues from radiation.  How Long Has This Been Causing You Problems? <Week  What Do You Feel Would Help You the Most Today? Therapy; Assessment Only; Medication   Have You Recently Been in Any Inpatient Treatment (Hospital/Detox/Crisis Center/28-Day Program)? No  Name/Location of Program/Hospital:No data recorded How Long Were You There? No data recorded When Were You Discharged? No data recorded  Have You Ever Received Services From [facility] Before? Yes  Who Do You See at [facility]? Inpatient treatment   Have You Recently Had Any Thoughts About Hurting Yourself? No  Are You Planning to Commit Suicide/Harm Yourself At This time? No   Have you Recently Had Thoughts About Hurting Someone [name]? No  Explanation: No data recorded  Have You Used Any Alcohol or Drugs in the Past 24 Hours? No  How Long Ago Did You Use Drugs or Alcohol? No data recorded What Did You Use and How Much? No data recorded  Do You Currently Have a Therapist/Psychiatrist? Yes  Name of Therapist/Psychiatrist: [facility]   Have You Been Recently Discharged From Any Office Practice or Programs? No  Explanation of Discharge From Practice/Program: No data recorded    CCA Screening Triage Referral Assessment Type of Contact: Face-to-Face  Is this Initial or Reassessment? No data recorded Date Telepsych consult ordered in [facility]:  No data recorded Time Telepsych consult ordered in [facility]:  No data  recorded  Patient Reported Information Reviewed? Yes  Patient Left Without Being Seen? No data recorded Reason for Not Completing Assessment: No data recorded  Collateral Involvement: Della Goo   Does  Patient Have a Automotive engineer Guardian? No data recorded Name and Contact of Legal Guardian: No data recorded If Minor and Not Living with Parent(s), Who has Custody? No data recorded Is CPS involved or ever been involved? Never  Is APS involved or ever been involved? Never   Patient Determined To Be At Risk for Harm To Self or Others Based on Review of Patient Reported Information or Presenting Complaint? No  Method: No data recorded Availability of Means: No data recorded Intent: No data recorded Notification Required: No data recorded Additional Information for Danger to Others Potential: No data recorded Additional Comments for Danger to Others Potential: No data recorded Are There Guns or Other Weapons in Your Home? No data recorded Types of Guns/Weapons: No data recorded Are These Weapons Safely Secured?                            No data recorded Who Could Verify You Are Able To Have These Secured: No data recorded Do You Have any Outstanding Charges, Pending Court Dates, Parole/Probation? No data recorded Contacted To Inform of Risk of Harm To Self or Others: No data recorded  Location of Assessment: [facility] ED   Does Patient Present under Involuntary Commitment? No  IVC Papers Initial File Date: No data recorded  [place] of Residence: [place]   Patient Currently Receiving the Following Services: Medication Management   Determination of Need: Emergent (2 hours)   Options For Referral: Medication Management; ED Visit     CCA Biopsychosocial Intake/Chief Complaint:  Patient presents believing that he has medial issues  Current Symptoms/Problems: Patient presents believing that he has medial issues   Patient Reported Schizophrenia/Schizoaffective Diagnosis in Past: -- (Unknown)   Strengths: Unknown  Preferences: Unknown  Abilities: Unknown   Type of Services Patient Feels are Needed: Medical   Initial Clinical Notes/Concerns: None   Mental  Health Symptoms Depression:  Hopelessness; Irritability; Increase/decrease in appetite; Sleep (too much or little)   Duration of Depressive symptoms: Greater than two weeks   Mania:  Irritability; Racing thoughts; Change in energy/activity   Anxiety:   Irritability; Difficulty concentrating; Worrying   Psychosis:  Delusions   Duration of Psychotic symptoms: Less than six months   Trauma:  None   Obsessions:  Poor insight   Compulsions:  Poor Insight   Inattention:  None   Hyperactivity/Impulsivity:  Feeling of restlessness   Oppositional/Defiant Behaviors:  None   Emotional Irregularity:  None   Other Mood/Personality Symptoms:  No data recorded   Mental Status Exam Appearance and self-care  Stature:  Average   Weight:  Average weight   Clothing:  Casual   Grooming:  Normal   Cosmetic use:  None   Posture/gait:  Normal   Motor activity:  Not Remarkable   Sensorium  Attention:  Normal   Concentration:  Normal   Orientation:  X5   Recall/memory:  Normal   Affect and Mood  Affect:  Anxious   Mood:  Anxious   Relating  Eye contact:  Normal   Facial expression:  Sad   Attitude toward examiner:  Cooperative   Thought and Language  Speech flow: Clear and Coherent   Thought content:  Delusions (Some thought blocking)   Preoccupation:  Obsessions;  Somatic   Hallucinations:  Other (Comment)   Organization:  No data recorded  [employer] of Knowledge:  Fair   Intelligence:  Average   Abstraction:  Functional   Judgement:  Fair   Reality Testing:  Distorted   Insight:  Lacking   Decision Making:  Confused   Social Functioning  Social Maturity:  Responsible   Social Judgement:  Normal   Stress  Stressors:  Other (Comment)   Coping Ability:  Exhausted   Skill Deficits:  None   Supports:  Family     Religion:    Leisure/Recreation:    Exercise/Diet:     CCA Employment/Education Employment/Work  Situation:    Education:     CCA Family/Childhood History Family and Relationship History:    Childhood History:     Child/Adolescent Assessment:     CCA Substance Use Alcohol/Drug Use:                           ASAM's:  Six Dimensions of Multidimensional Assessment  Dimension 1:  Acute Intoxication and/or Withdrawal Potential:      Dimension 2:  Biomedical Conditions and Complications:      Dimension 3:  Emotional, Behavioral, or Cognitive Conditions and Complications:     Dimension 4:  Readiness to Change:     Dimension 5:  Relapse, Continued use, or Continued Problem Potential:     Dimension 6:  Recovery/Living Environment:     ASAM Severity Score:    ASAM Recommended Level of Treatment:     Substance use Disorder (SUD)    Recommendations for Services/Supports/Treatments:    DSM5 Diagnoses: Patient Active Problem List   Diagnosis Date Noted  . 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 Bipolar I disorder, most recent episode depressed with anxious distress (HCC) 08/06/2017  . ADHD (attention deficit hyperactivity disorder) 07/08/2017  . Personality disorder (HCC) 07/08/2017  . Insomnia 06/09/2017  . Aggression 01/31/2016  . 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 Autism spectrum disorder associated with known medical or genetic condition or environmental factor, requiring substantial support (level 2) 11/06/2015  . 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 Clinical von Recklinghausen's disease (HCC) 11/06/2015    Patient Centered Plan: Patient is on the following Treatment Plan(s):  Anxiety   Referrals to Alternative Service(s): Referred to Alternative Service(s):   Place:   Date:   Time:    Referred to Alternative Service(s):   Place:   Date:   Time:    Referred to Alternative Service(s):   Place:   Date:   Time:    Referred to Alternative Service(s):   Place:    Date:   Time:     Tenille Morrill Dierdre Searles, Counselor, LCAS-A

## 2020-07-13 NOTE — Consult Note (Signed)
[facility] Face-to-Face Psychiatry Consult   Reason for Consult: Consult for 20 year old man visiting the emergency room yet again for complaints of vague somatic symptoms Referring Physician: Katrinka Blazing Patient Identification: Connor [name] MRN:  [id number] Principal Diagnosis: Autism spectrum disorder associated with known medical or genetic condition or environmental factor, requiring substantial support (level 2) Diagnosis:  Principal Problem:   Autism spectrum disorder associated with known medical or genetic condition or environmental factor, requiring substantial support (level 2) Active Problems:   Insomnia   ADHD (attention deficit hyperactivity disorder)   Adjustment disorder with mixed disturbance of emotions and conduct   Total Time spent with patient: 1 hour  Subjective:   Connor [name] is a 20 y.o. male patient admitted with "it is the radiation".  HPI: Patient seen chart reviewed.  Patient was seen just a few days ago in the emergency room for the exact same problem.  Patient had some CT scans a couple weeks ago and ever since then has been convinced that they have caused a multitude of somatic symptoms.  He talks about feeling like his skin is burning, cannot sleep at night, feels like everything is "too bright".  Multiple times says that he feels like he is dying.  To observation there is clearly nothing wrong with his skin.  Pupils are normal and there is nothing wrong with his eyesight.  Nothing about his behavior looks like somebody in great physical pain.  Work-up done so far has shown nothing out of the ordinary and physical exam has been normal.  Patient is absolutely convinced that he has something wrong with him as a result of having had radiation exposure.  It has been explained to him multiple times that the level of radiation and CAT scans is minimal and that none of the symptoms he is having are consistent with any side effect.  I tried to work with him  today about getting him to understand that his somatic symptoms do not represent any physiologic problem and there is nothing really "medical" that is going to be done about them.  He absolutely denies any suicidal or homicidal ideation or thought of hurting himself.  Denies hallucinations.  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  Functions at a low level normally.  History of ADHD.  No history of suicide attempts.  Risk to Self:   Risk to Others:   Prior Inpatient Therapy:   Prior Outpatient Therapy:    Past Medical History:  Past Medical History:  Diagnosis Date  . ADHD (attention deficit hyperactivity disorder)   . Autism   . Depressed   . Neurofibromatosis (HCC)    Type 1  . Obesity   . Pertussis    as a infant    Past Surgical History:  Procedure Laterality Date  . MRI    . RADIOLOGY WITH ANESTHESIA N/A 08/13/2012   Procedure: RADIOLOGY WITH ANESTHESIA;  Surgeon: Medication Radiologist, MD;  Location: [facility] OR;  Service: Radiology;  Laterality: N/A;  MRI    Family History:  Family History  Problem Relation Age of Onset  . Anxiety disorder Mother   . Depression Mother   . OCD Mother   . Hypertension Mother   . Cancer Maternal Aunt   . Cancer Maternal Grandmother   . Hypertension Father   . Schizophrenia Maternal Uncle    Family Psychiatric  History: Anxiety and OCD reported in his mother Social History:  Social History   Substance and Sexual Activity  Alcohol Use No  . Alcohol/week: 0.0 standard drinks     Social History   Substance and Sexual Activity  Drug Use No    Social History   Socioeconomic History  . Marital status: Single    Spouse name: Not on file  . Number of children: Not on file  . Years of education: Not on file  . Highest education level: Not on file  Occupational History  . Not on file  Tobacco Use  . Smoking status: Former Smoker     Packs/day: 1.00    Years: 1.00    Pack years: 1.00    Types: Cigarettes    Quit date: 12/31/2014    Years since quitting: 5.5  . Smokeless tobacco: Never Used  Substance and Sexual Activity  . Alcohol use: No    Alcohol/week: 0.0 standard drinks  . Drug use: No  . Sexual activity: Not Currently    Birth control/protection: None  Other Topics Concern  . Not on file  Social History Narrative   Connor [name] is a high school drop out.   He lives with his mom only. He has one sister.   He enjoys eating, sleeping, and watching tv.   Social Determinants of Health   Financial Resource Strain: Not on file  Food Insecurity: Not on file  Transportation Needs: Not on file  Physical Activity: Not on file  Stress: Not on file  Social Connections: Not on file   Additional Social History:    Allergies:  No Known Allergies  Labs:  Results for orders placed or performed during the hospital encounter of 07/13/20 (from the past 48 hour(s))  Acetaminophen level     Status: Abnormal   Collection Time: 07/11/20  8:11 PM  Result Value Ref Range   Acetaminophen (Tylenol), Serum <10 (L) 10 - 30 ug/mL    Comment: (NOTE) Therapeutic concentrations vary significantly. A range of 10-30 ug/mL  may be an effective concentration for many patients. However, some  are best treated at concentrations outside of this range. Acetaminophen concentrations >150 ug/mL at 4 hours after ingestion  and >50 ug/mL at 12 hours after ingestion are often associated with  toxic reactions.  Performed at [facility], [address] [place], [place] [address]   Salicylate level     Status: Abnormal   Collection Time: 07/11/20  8:11 PM  Result Value Ref Range   Salicylate Lvl <7.0 (L) 7.0 - 30.0 mg/dL    Comment: Performed at [facility], [address] [place], [place] [address]  Ethanol     Status: None   Collection Time: 07/11/20  8:11 PM  Result Value Ref Range   Alcohol, Ethyl (B) <10 <10  mg/dL    Comment: (NOTE) Lowest detectable limit for serum alcohol is 10 mg/dL.  For medical purposes only. Performed at [facility], [address] [place], [place] [address]   Comprehensive metabolic panel     Status: Abnormal   Collection Time: 07/13/20 11:04 AM  Result Value Ref Range   Sodium 137 135 - 145 mmol/L   Potassium 4.1 3.5 - 5.1 mmol/L    Comment: HEMOLYSIS AT THIS LEVEL MAY AFFECT RESULT   Chloride 100 98 - 111 mmol/L   CO2 23 22 - 32 mmol/L   Glucose, Bld 86 70 - 99 mg/dL    Comment: Glucose reference range applies only to samples taken after fasting for at least 8  hours.   BUN 11 6 - 20 mg/dL   Creatinine, Ser 7.25 0.61 - 1.24 mg/dL   Calcium 9.4 8.9 - 36.6 mg/dL   Total Protein 8.3 (H) 6.5 - 8.1 g/dL   Albumin 4.9 3.5 - 5.0 g/dL   AST 38 15 - 41 U/L    Comment: HEMOLYSIS AT THIS LEVEL MAY AFFECT RESULT   ALT 34 0 - 44 U/L    Comment: HEMOLYSIS AT THIS LEVEL MAY AFFECT RESULT   Alkaline Phosphatase 59 38 - 126 U/L   Total Bilirubin 1.7 (H) 0.3 - 1.2 mg/dL    Comment: HEMOLYSIS AT THIS LEVEL MAY AFFECT RESULT   GFR, Estimated >60 >60 mL/min    Comment: (NOTE) Calculated using the CKD-EPI Creatinine Equation (2021)    Anion gap 14 5 - 15    Comment: Performed at [facility], [address] [place], [place] [address]  CBC     Status: None   Collection Time: 07/13/20 11:04 AM  Result Value Ref Range   WBC 6.2 4.0 - 10.5 K/uL   RBC 5.45 4.22 - 5.81 MIL/uL   Hemoglobin 16.0 13.0 - 17.0 g/dL   HCT 74.2 59.5 - 63.8 %   MCV 85.5 80.0 - 100.0 fL   MCH 29.4 26.0 - 34.0 pg   MCHC 34.3 30.0 - 36.0 g/dL   RDW 75.6 43.3 - 29.5 %   Platelets 321 150 - 400 K/uL   nRBC 0.0 0.0 - 0.2 %    Comment: Performed at [facility], [address] [place], [place] [address]  Urinalysis, Complete w Microscopic     Status: Abnormal   Collection Time: 07/13/20 11:04 AM  Result Value Ref Range   Color, Urine YELLOW (A) YELLOW   APPearance  HAZY (A) CLEAR   Specific Gravity, Urine 1.031 (H) 1.005 - 1.030   pH 5.0 5.0 - 8.0   Glucose, UA NEGATIVE NEGATIVE mg/dL   Hgb urine dipstick NEGATIVE NEGATIVE   Bilirubin Urine NEGATIVE NEGATIVE   Ketones, ur 80 (A) NEGATIVE mg/dL   Protein, ur 30 (A) NEGATIVE mg/dL   Nitrite NEGATIVE NEGATIVE   Leukocytes,Ua NEGATIVE NEGATIVE   RBC / HPF 0-5 0 - 5 RBC/hpf   WBC, UA 0-5 0 - 5 WBC/hpf   Bacteria, UA RARE (A) NONE SEEN   Squamous Epithelial / LPF 0-5 0 - 5   Mucus PRESENT     Comment: Performed at [facility], [address] [place], [place] [address]    No current facility-administered medications for this encounter.   Current Outpatient Medications  Medication Sig Dispense Refill  . omeprazole (PRILOSEC) 20 MG capsule Take 20 mg by mouth daily.    . ziprasidone (GEODON) 40 MG capsule Take 40 mg by mouth 2 (two) times daily with a meal.      Musculoskeletal: Strength & Muscle Tone: within normal limits Gait & Station: normal Patient leans: N/A  Psychiatric Specialty Exam: Physical Exam Vitals and nursing note reviewed.  Constitutional:      Appearance: He is well-developed and well-nourished.  HENT:     Head: Normocephalic and atraumatic.  Eyes:     Conjunctiva/sclera: Conjunctivae normal.     Pupils: Pupils are equal, round, and reactive to light.  Cardiovascular:     Heart sounds: Normal heart sounds.  Pulmonary:     Effort: Pulmonary effort is normal.  Abdominal:     Palpations: Abdomen is soft.  Musculoskeletal:  General: Normal range of motion.     Cervical back: Normal range of motion.  Skin:    General: Skin is warm and dry.  Neurological:     General: No focal deficit present.     Mental Status: He is alert.  Psychiatric:        Attention and Perception: He is inattentive.        Mood and Affect: Mood is anxious.        Speech: Speech normal.        Behavior: Behavior is agitated. Behavior is not aggressive or hyperactive.         Thought Content: Thought content does not include homicidal or suicidal ideation.        Cognition and Memory: Cognition is impaired. Memory is impaired.        Judgment: Judgment is inappropriate.     Review of Systems  Constitutional: Negative.   HENT: Negative.   Eyes: Negative.   Respiratory: Negative.   Cardiovascular: Negative.   Gastrointestinal: Negative.   Musculoskeletal: Negative.   Skin: Negative.   Neurological: Negative.   Psychiatric/Behavioral: Positive for dysphoric mood and sleep disturbance.    Blood pressure 139/86, pulse 100, temperature 98.8 F (37.1 C), temperature source Oral, resp. rate 18, height 6\' 3"  (1.905 m), weight 135 kg, SpO2 97 %.Body mass index is 37.2 kg/m.  General Appearance: Fairly Groomed  Eye Contact:  Fair  Speech:  Clear and Coherent  Volume:  Increased  Mood:  Anxious  Affect:  Congruent  Thought Process:  Disorganized  Orientation:  Full (Time, Place, and Person)  Thought Content:  Obsessions  Suicidal Thoughts:  No  Homicidal Thoughts:  No  Memory:  Immediate;   Fair Recent;   Poor Remote;   Poor  Judgement:  Impaired  Insight:  Shallow  Psychomotor Activity:  Restlessness  Concentration:  Concentration: Poor  Recall:  Poor  Fund of Knowledge:  Fair  Language:  Fair  Akathisia:  No  Handed:  Right  AIMS (if indicated):     Assets:  Desire for Improvement Housing Physical Health Resilience Social Support  ADL's:  Intact  Cognition:  Impaired,  Mild  Sleep:        Treatment Plan Summary: Plan Patient extremely anxious.  Not open to being able to process the idea that his somatic symptoms are manifestations of anxiety.  Tried to convey to him that there was no further medical work-up that was going to be done and no indication for hospitalization.  Patient made it abundantly clear that he did not want to be admitted to the psychiatric ward and he does not meet commitment criteria.  Offered the patient the possibility  of some medication possibly something fairly benign like gabapentin to help with the sleep and the supposedly nerve pain.  Patient declines that as well.  At this point there is no need for further treatment.  This may be developing into real obsessive-compulsive disorder but at this point no dangerousness that would require inpatient hospitalization.  Needs to be followed up as an outpatient.  Disposition: No evidence of imminent risk to self or others at present.   Patient does not meet criteria for psychiatric inpatient admission. Supportive therapy provided about ongoing stressors. Discussed crisis plan, support from social network, calling 911, coming to the Emergency Department, and calling Suicide Hotline.  Mordecai RasmussenJohn Jaylin Benzel, MD 07/13/2020 5:50 PM

## 2020-07-13 NOTE — ED Triage Notes (Signed)
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 pt c/o feeling confused, dizziness, body aches, weight loss, loss of appetite, "I fell like something is very wrong.Connor [name]

## 2020-07-13 NOTE — ED Notes (Signed)
Pt's mom updated about pt discharge.

## 2020-07-15 ENCOUNTER — Other Ambulatory Visit: Payer: Self-pay

## 2020-07-15 ENCOUNTER — Emergency Department
Admission: EM | Admit: 2020-07-15 | Discharge: 2020-07-15 | Disposition: A | Discharge status: Home / Self Care | Payer: Medicare Other | Attending: Emergency Medicine | Admitting: Emergency Medicine

## 2020-07-15 DIAGNOSIS — Z87891 Personal history of nicotine dependence: Secondary | ICD-10-CM | POA: Insufficient documentation

## 2020-07-15 DIAGNOSIS — F840 Autistic disorder: Secondary | ICD-10-CM | POA: Insufficient documentation

## 2020-07-15 DIAGNOSIS — R52 Pain, unspecified: Secondary | ICD-10-CM | POA: Diagnosis not present

## 2020-07-15 DIAGNOSIS — R5383 Other fatigue: Secondary | ICD-10-CM | POA: Insufficient documentation

## 2020-07-15 DIAGNOSIS — Z046 Encounter for general psychiatric examination, requested by authority: Secondary | ICD-10-CM | POA: Diagnosis present

## 2020-07-15 DIAGNOSIS — F909 Attention-deficit hyperactivity disorder, unspecified type: Secondary | ICD-10-CM | POA: Diagnosis not present

## 2020-07-15 DIAGNOSIS — F419 Anxiety disorder, unspecified: Secondary | ICD-10-CM | POA: Diagnosis not present

## 2020-07-15 DIAGNOSIS — R6889 Other general symptoms and signs: Secondary | ICD-10-CM

## 2020-07-15 NOTE — ED Notes (Signed)
PT yells at Dr. Scotty Court during assessment at times, Dr. Scotty Court is completed with exam now

## 2020-07-15 NOTE — ED Provider Notes (Signed)
[facility] Emergency Department Provider Note  ____________________________________________  Time seen: Approximately 7:42 PM  I have reviewed the triage vital signs and the nursing notes.   HISTORY  Chief Complaint Psychiatric Evaluation    HPI Connor [name] is a 20 y.o. male with a history of ADHD, autism, adjustment disorder who comes ED complaining of multiple symptoms which he attributes to radiation exposure related to having a CT scan of the chest done  8 days ago.  Denies any headache or vision change.  He feels like everything looks "bright."  He feels a "burning" pain throughout his entire body, and feels worried that he is sick.  He keeps repeating "I am not okay."  Denies any focal pain complaints, no fevers or chills, no new symptoms since his last ED visit a few days ago.  Reviewed EMR.  He was seen by psychiatry during that time who felt that he was suffering from severe anxiety.  However, patient was not amenable to treatment for anxiety at that time.  He reports that he is currently not willing to take any medications and does not believe that this is due to anxiety but rather due to radiation exposure.     Past Medical History:  Diagnosis Date  . ADHD (attention deficit hyperactivity disorder)   . Autism   . Depressed   . Neurofibromatosis (HCC)    Type 1  . Obesity   . Pertussis    as a infant     Patient Active Problem List   Diagnosis Date Noted  . 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 ADHD (attention deficit hyperactivity disorder) 07/08/2017  . Personality disorder (HCC) 07/08/2017  . Insomnia 06/09/2017  . Aggression 01/31/2016  . 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 Autism spectrum disorder associated with known medical or genetic condition or environmental factor, requiring substantial support (level 2) 11/06/2015  . Neurofibromatosis, type I (von  Recklinghausen's disease) (HCC) 11/06/2015  . Clinical von Recklinghausen's disease (HCC) 11/06/2015     Past Surgical History:  Procedure Laterality Date  . MRI    . RADIOLOGY WITH ANESTHESIA N/A 08/13/2012   Procedure: RADIOLOGY WITH ANESTHESIA;  Surgeon: Medication Radiologist, MD;  Location: [facility] OR;  Service: Radiology;  Laterality: N/A;  MRI      Prior to Admission medications   Medication Sig Start Date End Date Taking? Authorizing Provider  omeprazole (PRILOSEC) 20 MG capsule Take 20 mg by mouth daily. 07/05/20   [provider]  ziprasidone (GEODON) 40 MG capsule Take 40 mg by mouth 2 (two) times daily with a meal.    [provider]     Allergies Patient has no known allergies.   Family History  Problem Relation Age of Onset  . Anxiety disorder Mother   . Depression Mother   . OCD Mother   . Hypertension Mother   . Cancer Maternal Aunt   . Cancer Maternal Grandmother   . Hypertension Father   . Schizophrenia Maternal Uncle     Social History Social History   Tobacco Use  . Smoking status: Former Smoker    Packs/day: 1.00    Years: 1.00    Pack years: 1.00    Types: Cigarettes    Quit date: 12/31/2014    Years since quitting: 5.5  . Smokeless tobacco: Never Used  Substance Use Topics  . Alcohol use: No    Alcohol/week: 0.0 standard drinks  . Drug use: No  Review of Systems  Constitutional:   No fever or chills.  ENT:   No sore throat. No rhinorrhea. Cardiovascular:   No chest pain or syncope. Respiratory:   No dyspnea or cough. Gastrointestinal:   Negative for abdominal pain, vomiting and diarrhea.  Musculoskeletal:   Negative for focal pain or swelling All other systems reviewed and are negative except as documented above in ROS and HPI.  ____________________________________________   PHYSICAL EXAM:  VITAL SIGNS: ED Triage Vitals  Enc Vitals Group     BP 07/15/20 1638 (!) 155/90     Pulse Rate 07/15/20 1638 98     Resp  07/15/20 1638 16     Temp 07/15/20 1638 97.9 F (36.6 C)     Temp Source 07/15/20 1638 Oral     SpO2 07/15/20 1638 99 %     Weight 07/15/20 1632 297 lb 9.9 oz (135 kg)     Height 07/15/20 1632 6\' 3"  (1.905 m)     Head Circumference --      Peak Flow --      Pain Score 07/15/20 1632 7     Pain Loc --      Pain Edu? --      Excl. in GC? --     Vital signs reviewed, nursing assessments reviewed.   Constitutional:   Alert and oriented. Non-toxic appearance. Eyes:   Conjunctivae are normal. EOMI. PERRL.  No nystagmus ENT      Head:   Normocephalic and atraumatic.      Nose:   Normal      Mouth/Throat: Moist mucosa      Neck:   No meningismus. Full ROM.  No thyromegaly Hematological/Lymphatic/Immunilogical:   No cervical lymphadenopathy. Cardiovascular:   RRR. Symmetric bilateral radial and DP pulses.  No murmurs. Cap refill less than 2 seconds. Respiratory:   Normal respiratory effort without tachypnea/retractions. Breath sounds are clear and equal bilaterally. No wheezes/rales/rhonchi. Gastrointestinal:   Soft and nontender. Non distended. There is no CVA tenderness.  No rebound, rigidity, or guarding.  Musculoskeletal:   Normal range of motion in all extremities. No joint effusions.  No lower extremity tenderness.  No edema. Neurologic:   Normal speech and language.  Motor grossly intact. No acute focal neurologic deficits are appreciated.  Skin:    Skin is warm, dry and intact.  Mild skin dryness over the bilateral knuckles, otherwise no rash or lesions.  No petechiae, purpura, or bullae.  ____________________________________________    LABS (pertinent positives/negatives) (all labs ordered are listed, but only abnormal results are displayed) Labs Reviewed - No data to display ____________________________________________   EKG    ____________________________________________    RADIOLOGY  No results  found.  ____________________________________________   PROCEDURES Procedures  ____________________________________________    CLINICAL IMPRESSION / ASSESSMENT AND PLAN / ED COURSE  Medications ordered in the ED: Medications - No data to display  Pertinent labs & imaging results that were available during my care of the patient were reviewed by me and considered in my medical decision making (see chart for details).  07/17/20 [name] was evaluated in Emergency Department on 07/15/2020 for the symptoms described in the history of present illness. He was evaluated in the context of the global COVID-19 pandemic, which necessitated consideration that the patient might be at risk for infection with the SARS-CoV-2 virus that causes COVID-19. Institutional protocols and algorithms that pertain to the evaluation of patients at risk for COVID-19 are in a state of rapid change  based on information released by regulatory bodies including the CDC and federal and state organizations. These policies and algorithms were followed during the patient's care in the ED.   Patient presents with multitude of symptoms which he feels are worsening.  Reports that this started 8 days ago after having a CT scan of the chest done, no ongoing radiation exposure or possible ingestions or environmental concerns.  No trauma.  Patient appears anxious and overwhelmed, not psychotic, lucid.  No SI or HI.  I conducted an extensive physical exam and history, unable to find any significant abnormalities other than dry skin on his hands.  He is medically stable and psychiatrically stable.  Encouraged him to follow-up with primary care.  He also seems amenable to follow-up with neurology which I recommend to help connect him to further evaluation.      ____________________________________________   FINAL CLINICAL IMPRESSION(S) / ED DIAGNOSES    Final diagnoses:  Fatigue, unspecified type  Anxiety  Somatic  complaints, multiple     ED Discharge Orders    None      Portions of this note were generated with dragon dictation software. Dictation errors may occur despite best attempts at proofreading.   Sharman Cheek, MD 07/15/20 2007

## 2020-07-15 NOTE — ED Notes (Signed)
Dr. Scotty Court into room now to perform assessment on pt that pt requests.

## 2020-07-15 NOTE — ED Notes (Signed)
Dawn, Mother, called now, will be to pick pt up. Will come to waiting room desk and pt will be escorted out at this time.

## 2020-07-15 NOTE — ED Notes (Signed)
Pt calls for Dr. Scotty Court to return to room, Dr. Scotty Court is unavailable, this nurse speaks to pt. PT reports that he was exposed to radiation 8 days ago and has not been "okay" since. States that night he started to sweat over his whole body and now he needs help. Pt explained by nurse that he is cleared in ER and that what is now important is to follow up with neurology so that they can help him. PT starts to yell no loudly but is reassured that this is a good plan of care. PT agrees for this nurse to call his mother and have her come pick him up now. Will call mother at this time.

## 2020-07-15 NOTE — ED Notes (Signed)
Hourly rounding completed at this time, patient currently awake in room. No complaints, stable, and in no acute distress. Q15 minute rounds and monitoring via Rover and Officer to continue. °

## 2020-07-15 NOTE — ED Notes (Addendum)
No blood work at this time per Dr. Stafford. 

## 2020-07-15 NOTE — ED Notes (Signed)
Report received from [place], RN including Situation, Background, Assessment, and Recommendations. Patient alert and oriented, warm and dry, and in no acute distress. Patient denies SI, HI, AVH and pain. Patient made aware of Q15 minute rounds and Psychologist, counselling presence for their safety. Patient instructed to come to this nurse with needs or concerns.

## 2020-07-15 NOTE — ED Triage Notes (Signed)
Pt comes ems from home with radiation exposure. Has been seen here multiple times for psych related issues for the "radiation." VSS. Voluntary.

## 2020-07-19 ENCOUNTER — Emergency Department
Admission: EM | Admit: 2020-07-19 | Discharge: 2020-07-19 | Disposition: A | Discharge status: Home / Self Care | Payer: Medicare Other | Attending: Emergency Medicine | Admitting: Emergency Medicine

## 2020-07-19 ENCOUNTER — Encounter: Payer: Self-pay | Admitting: Emergency Medicine

## 2020-07-19 ENCOUNTER — Other Ambulatory Visit: Payer: Self-pay

## 2020-07-19 DIAGNOSIS — R630 Anorexia: Secondary | ICD-10-CM | POA: Diagnosis not present

## 2020-07-19 DIAGNOSIS — R69 Illness, unspecified: Secondary | ICD-10-CM | POA: Insufficient documentation

## 2020-07-19 DIAGNOSIS — Z5321 Procedure and treatment not carried out due to patient leaving prior to being seen by health care provider: Secondary | ICD-10-CM | POA: Diagnosis not present

## 2020-07-19 DIAGNOSIS — W899XXA Exposure to unspecified man-made visible and ultraviolet light, initial encounter: Secondary | ICD-10-CM | POA: Insufficient documentation

## 2020-07-19 DIAGNOSIS — R110 Nausea: Secondary | ICD-10-CM | POA: Insufficient documentation

## 2020-07-19 DIAGNOSIS — L568 Other specified acute skin changes due to ultraviolet radiation: Secondary | ICD-10-CM | POA: Diagnosis not present

## 2020-07-19 NOTE — ED Triage Notes (Signed)
Pt comes into the ED via POV c/o radiation exposure 12 days ago.  Pt has been seen here multiple times for the "radiation exposure" and states he has photosensitivity, nausea, "anorexia and bulimia", and overall just "feel sick".   Pt has even and unlabored respirations, ambulatory to triage and is in NAD.

## 2020-07-20 ENCOUNTER — Emergency Department
Admission: EM | Admit: 2020-07-20 | Discharge: 2020-07-20 | Disposition: A | Discharge status: Home / Self Care | Payer: Medicare Other | Attending: Emergency Medicine | Admitting: Emergency Medicine

## 2020-07-20 ENCOUNTER — Encounter: Payer: Self-pay | Admitting: Emergency Medicine

## 2020-07-20 ENCOUNTER — Other Ambulatory Visit: Payer: Self-pay

## 2020-07-20 DIAGNOSIS — K5900 Constipation, unspecified: Secondary | ICD-10-CM | POA: Diagnosis not present

## 2020-07-20 DIAGNOSIS — F4325 Adjustment disorder with mixed disturbance of emotions and conduct: Secondary | ICD-10-CM | POA: Diagnosis not present

## 2020-07-20 DIAGNOSIS — F609 Personality disorder, unspecified: Secondary | ICD-10-CM | POA: Diagnosis present

## 2020-07-20 DIAGNOSIS — F39 Unspecified mood [affective] disorder: Secondary | ICD-10-CM | POA: Diagnosis present

## 2020-07-20 DIAGNOSIS — G4700 Insomnia, unspecified: Secondary | ICD-10-CM | POA: Diagnosis present

## 2020-07-20 DIAGNOSIS — R202 Paresthesia of skin: Secondary | ICD-10-CM | POA: Diagnosis not present

## 2020-07-20 DIAGNOSIS — R5382 Chronic fatigue, unspecified: Secondary | ICD-10-CM | POA: Diagnosis not present

## 2020-07-20 DIAGNOSIS — R110 Nausea: Secondary | ICD-10-CM | POA: Insufficient documentation

## 2020-07-20 DIAGNOSIS — F22 Delusional disorders: Secondary | ICD-10-CM | POA: Diagnosis not present

## 2020-07-20 DIAGNOSIS — F840 Autistic disorder: Secondary | ICD-10-CM | POA: Diagnosis present

## 2020-07-20 DIAGNOSIS — Z87891 Personal history of nicotine dependence: Secondary | ICD-10-CM | POA: Insufficient documentation

## 2020-07-20 LAB — URINALYSIS, COMPLETE (UACMP) WITH MICROSCOPIC
Bacteria, UA: NONE SEEN
Bilirubin Urine: NEGATIVE
Glucose, UA: NEGATIVE mg/dL
Hgb urine dipstick: NEGATIVE
Ketones, ur: 20 mg/dL — AB
Leukocytes,Ua: NEGATIVE
Nitrite: NEGATIVE
Protein, ur: 100 mg/dL — AB
Specific Gravity, Urine: 1.026 (ref 1.005–1.030)
Squamous Epithelial / HPF: NONE SEEN (ref 0–5)
pH: 8 (ref 5.0–8.0)

## 2020-07-20 LAB — CBC WITH DIFFERENTIAL/PLATELET
Abs Immature Granulocytes: 0.02 10*3/uL (ref 0.00–0.07)
Basophils Absolute: 0.1 10*3/uL (ref 0.0–0.1)
Basophils Relative: 1 %
Eosinophils Absolute: 0.2 10*3/uL (ref 0.0–0.5)
Eosinophils Relative: 3 %
HCT: 48.5 % (ref 39.0–52.0)
Hemoglobin: 16.3 g/dL (ref 13.0–17.0)
Immature Granulocytes: 0 %
Lymphocytes Relative: 43 %
Lymphs Abs: 2.7 10*3/uL (ref 0.7–4.0)
MCH: 29.2 pg (ref 26.0–34.0)
MCHC: 33.6 g/dL (ref 30.0–36.0)
MCV: 86.9 fL (ref 80.0–100.0)
Monocytes Absolute: 0.6 10*3/uL (ref 0.1–1.0)
Monocytes Relative: 9 %
Neutro Abs: 2.8 10*3/uL (ref 1.7–7.7)
Neutrophils Relative %: 44 %
Platelets: 310 10*3/uL (ref 150–400)
RBC: 5.58 MIL/uL (ref 4.22–5.81)
RDW: 13.1 % (ref 11.5–15.5)
WBC: 6.3 10*3/uL (ref 4.0–10.5)
nRBC: 0 % (ref 0.0–0.2)

## 2020-07-20 LAB — BASIC METABOLIC PANEL
Anion gap: 15 (ref 5–15)
BUN: 8 mg/dL (ref 6–20)
CO2: 24 mmol/L (ref 22–32)
Calcium: 9.1 mg/dL (ref 8.9–10.3)
Chloride: 101 mmol/L (ref 98–111)
Creatinine, Ser: 0.86 mg/dL (ref 0.61–1.24)
GFR, Estimated: >60 mL/min (ref >60)
Glucose, Bld: 78 mg/dL (ref 70–99)
Potassium: 3.8 mmol/L (ref 3.5–5.1)
Sodium: 140 mmol/L (ref 135–145)

## 2020-07-20 LAB — CHLAMYDIA/NGC RT PCR (ARMC ONLY)
Chlamydia Tr: NOT DETECTED
N gonorrhoeae: NOT DETECTED

## 2020-07-20 NOTE — BH Assessment (Signed)
Comprehensive Clinical Assessment (CCA) Note  07/20/2020 Connor [name] [id number] Connor [name] is a 20 year old patient who presented to [facility] ED voluntarily. According to the triage note, Pt comes into the ED via POV c/o radiation exposure and having side effects from it.  Pt states the radiation exposure was 13 days ago.  Pt has been seen here 7x previously for the same thing.  Pt has a h/o paranoia.  Pt is c/o of constipation, photosensitivity, nausea, and overall feeling "sick".  Pt has even and unlabored respirations at this time and is in NAD. When asked what brought him to the hospital pt. states, "I don't know; medical issues I'm not sad, I'm not depressed, or suicidal." Pt verbalized recurring and persistent somatic delusions that include but are not limited to fluid retention problems and radiation exposure. Pt denies current SI, HI, AV/H, or symptoms of depression. Pt reported that he resides with his mother. Pt reported that his has a primary [name] provider but does not receive any mental health services. His recent and remote memory is intact. Pt was openly communicative and expressive. Pt was noted to be in severe distress and was visibly anxious. Pt was calm and cooperative throughout the interview. Pt presented with a slow speech and an anxious affect.      Recommendations for Services/Supports/Treatments: Disposition: Per Connor [name]., NP pt. does not meet inpatient criteria and is psych cleared.   Chief Complaint:  Chief Complaint  Patient presents with  . Fatigue  . Delusional   Visit Diagnosis: Delusional Disorder    CCA Screening, Triage and Referral (STR)  Patient Reported Information How did you hear about [place]? Self  Referral name: No data recorded Referral phone number: No data recorded  Whom do you see for routine medical problems? Primary [name]  Practice/Facility Name: No data recorded Practice/Facility Phone Number: No data recorded Name of  Contact: Connor [name] Number: [phone]  Contact Fax Number: No data recorded Prescriber Name: No data recorded Prescriber Address (if known): No data recorded  What Is the Reason for Your Visit/Call Today? Concerns about radiation exposure.  How Long Has This Been Causing You Problems? 1 wk - 1 month  What Do You Feel Would Help You the Most Today? Other (Comment) (Medical [name])   Have You Recently Been in Any Inpatient Treatment (Hospital/Detox/Crisis Center/28-Day Program)? No  Name/Location of Program/Hospital:No data recorded How Long Were You There? No data recorded When Were You Discharged? No data recorded  Have You Ever Received Services From [facility] Before? No  Who Do You See at [facility]? Inpatient treatment   Have You Recently Had Any Thoughts About Hurting Yourself? No  Are You Planning to Commit Suicide/Harm Yourself At This time? No   Have you Recently Had Thoughts About Hurting Someone Connor [name]? No  Explanation: No data recorded  Have You Used Any Alcohol or Drugs in the Past 24 Hours? No  How Long Ago Did You Use Drugs or Alcohol? No data recorded What Did You Use and How Much? No data recorded  Do You Currently Have a Therapist/Psychiatrist? No  Name of Therapist/Psychiatrist: [name]   Have You Been Recently Discharged From Any Office Practice or Programs? No  Explanation of Discharge From Practice/Program: No data recorded    CCA Screening Triage Referral Assessment Type of Contact: Face-to-Face  Is this Initial or Reassessment? No data recorded Date Telepsych consult ordered in [facility]:  No data recorded Time Telepsych consult ordered in  [facility]:  No data recorded  Patient Reported Information Reviewed? Yes  Patient Left Without Being Seen? No data recorded Reason for Not Completing Assessment: No data recorded  Collateral Involvement: Connor [name]   Does Patient Have a Automotive engineer  Guardian? No data recorded Name and Contact of Legal Guardian: No data recorded If Minor and Not Living with Parent(s), Who has Custody? No data recorded Is CPS involved or ever been involved? Never  Is APS involved or ever been involved? Never   Patient Determined To Be At Risk for Harm To Self or Others Based on Review of Patient Reported Information or Presenting Complaint? No  Method: No data recorded Availability of Means: No data recorded Intent: No data recorded Notification Required: No data recorded Additional Information for Danger to Others Potential: No data recorded Additional Comments for Danger to Others Potential: No data recorded Are There Guns or Other Weapons in Your Home? No data recorded Types of Guns/Weapons: No data recorded Are These Weapons Safely Secured?                            No data recorded Who Could Verify You Are Able To Have These Secured: No data recorded Do You Have any Outstanding Charges, Pending Court Dates, Parole/Probation? No data recorded Contacted To Inform of Risk of Harm To Self or Others: No data recorded  Location of Assessment: [facility] ED   Does Patient Present under Involuntary Commitment? No  IVC Papers Initial File Date: No data recorded  [place] of Residence: [place]   Patient Currently Receiving the Following Services: Not Receiving Services   Determination of Need: Urgent (48 hours)   Options For Referral: Other: Comment     CCA Biopsychosocial Intake/Chief Complaint:  Patient presents believing that he has medial issues  Current Symptoms/Problems: Patient presents believing that he has medial issues   Patient Reported Schizophrenia/Schizoaffective Diagnosis in Past: -- (Unknown)   Strengths: Unknown  Preferences: Unknown  Abilities: Unknown   Type of Services Patient Feels are Needed: Medical   Initial Clinical Notes/Concerns: None   Mental Health Symptoms Depression:  Hopelessness;  Irritability; Increase/decrease in appetite; Sleep (too much or little)   Duration of Depressive symptoms: Greater than two weeks   Mania:  Irritability; Racing thoughts; Change in energy/activity   Anxiety:   Irritability; Difficulty concentrating; Worrying   Psychosis:  Delusions   Duration of Psychotic symptoms: Less than six months   Trauma:  None   Obsessions:  Poor insight   Compulsions:  Poor Insight   Inattention:  None   Hyperactivity/Impulsivity:  Feeling of restlessness   Oppositional/Defiant Behaviors:  None   Emotional Irregularity:  None   Other Mood/Personality Symptoms:  No data recorded   Mental Status Exam Appearance and self-[name]  Stature:  Average   Weight:  Average weight   Clothing:  Casual   Grooming:  Normal   Cosmetic use:  None   Posture/gait:  Normal   Motor activity:  Not Remarkable   Sensorium  Attention:  Normal   Concentration:  Normal   Orientation:  X5   Recall/memory:  Normal   Affect and Mood  Affect:  Anxious   Mood:  Anxious   Relating  Eye contact:  Normal   Facial expression:  Sad   Attitude toward examiner:  Cooperative   Thought and Language  Speech flow: Clear and Coherent   Thought content:  Delusions (Some thought blocking)  Preoccupation:  Obsessions; Somatic   Hallucinations:  Other (Comment)   Organization:  No data recorded  [employer] of Knowledge:  Fair   Intelligence:  Average   Abstraction:  Functional   Judgement:  Fair   Reality Testing:  Distorted   Insight:  Lacking   Decision Making:  Confused   Social Functioning  Social Maturity:  Responsible   Social Judgement:  Normal   Stress  Stressors:  Other (Comment)   Coping Ability:  Exhausted   Skill Deficits:  None   Supports:  Family     Religion:    Leisure/Recreation:    Exercise/Diet:     CCA Employment/Education Employment/Work Situation:    Education:     CCA  Family/Childhood History Family and Relationship History:    Childhood History:     Child/Adolescent Assessment:     CCA Substance Use Alcohol/Drug Use:                           ASAM's:  Six Dimensions of Multidimensional Assessment  Dimension 1:  Acute Intoxication and/or Withdrawal Potential:      Dimension 2:  Biomedical Conditions and Complications:      Dimension 3:  Emotional, Behavioral, or Cognitive Conditions and Complications:     Dimension 4:  Readiness to Change:     Dimension 5:  Relapse, Continued use, or Continued Problem Potential:     Dimension 6:  Recovery/Living Environment:     ASAM Severity Score:    ASAM Recommended Level of Treatment:     Substance use Disorder (SUD)    Recommendations for Services/Supports/Treatments:    DSM5 Diagnoses: Patient Active Problem List   Diagnosis Date Noted  . 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 ADHD (attention deficit hyperactivity disorder) 07/08/2017  . Personality disorder (HCC) 07/08/2017  . Insomnia 06/09/2017  . Aggression 01/31/2016  . 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 Autism spectrum disorder associated with known medical or genetic condition or environmental factor, requiring substantial support (level 2) 11/06/2015  . 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

## 2020-07-20 NOTE — ED Triage Notes (Signed)
Pt comes into the ED via POV c/o radiation exposure and having side effects from it.  Pt states the radiation exposure was 13 days ago.  Pt has been seen here 7x previously for the same thing.  Pt has a h/o paranoia.  Pt is c/o of constipation, photosensitivity, nausea, and overall feeling "sick".  Pt has even and unlabored respirations at this time and is in NAD.

## 2020-07-20 NOTE — Consult Note (Signed)
[facility] Face-to-Face Psychiatry Consult   Reason for Consult: Psychiatric Evaluation Referring Physician: Dr. Vicente Males Patient Identification: Connor [name] MRN:  [id number] Principal Diagnosis: <principal problem not specified> Diagnosis:  Active Problems:   Episodic mood disorder (HCC)   Autism spectrum disorder associated with known medical or genetic condition or environmental factor, requiring substantial support (level 2)   Insomnia   Personality disorder (HCC)   Adjustment disorder with mixed disturbance of emotions and conduct   Total Time spent with patient: 20 minutes  Subjective: "Look, I am not depressed, I do not want to hurt myself or anyone else Connor [name] is a 20 y.o. male patient presented to [place] ED via POV voluntarily due to paranoia he was exposed to radiation 13 days ago. Per ED triage nurse note, Pt comes into the ED via POV c/o radiation exposure and having side effects from it.  Pt states the radiation exposure was 13 days ago.  Pt has been seen here 7x previously for the same thing.  Pt has a h/o paranoia.  Pt is c/o of constipation, photosensitivity, nausea, and overall feeling "sick".  Pt has even and unlabored respirations at this time and is in NAD.  The patient was seen just a few days ago in the emergency room for the same problem. The patient had some CT scans a couple of weeks ago and, ever since then, has been convinced that they have caused a multitude of somatic symptoms. He talks about feeling like he is breaking inside, cannot sleep at night, feels like everything is "too bright."    The patient was seen face-to-face by this provider; the chart was reviewed and consulted with Dr. Toni Amend and Dr. Vicente Males on 07/20/2020 due to the patient's care. It was discussed with both providers that the patient does not meet the criteria to be admitted to the psychiatric inpatient unit.  On evaluation, the patient is alert and oriented x 4,  anxious, cooperative, and mood-congruent with affect.  The patient presented with paranoid behaviors since his CT scan a couple of weeks ago. The patient does not appear to be responding to internal or external stimuli. The patient is presenting with some delusional thinking. The patient denies auditory or visual hallucinations. The patient denies any suicidal, homicidal, or self-harm ideations. During an encounter with the patient, he answered questions appropriately but with vague and delayed answers.  Plans: The patient is not a safety risk to himself or others and does not require psychiatric inpatient admission for stabilization and treatment.  HPI:    Past Psychiatric History:   Risk to Self:  No Risk to Others:  No Prior Inpatient Therapy:  Unknown Prior Outpatient Therapy:  No  Past Medical History:  Past Medical History:  Diagnosis Date  . ADHD (attention deficit hyperactivity disorder)   . Autism   . Depressed   . Neurofibromatosis (HCC)    Type 1  . Obesity   . Pertussis    as a infant    Past Surgical History:  Procedure Laterality Date  . MRI    . RADIOLOGY WITH ANESTHESIA N/A 08/13/2012   Procedure: RADIOLOGY WITH ANESTHESIA;  Surgeon: Medication Radiologist, MD;  Location: [facility] OR;  Service: Radiology;  Laterality: N/A;  MRI    Family History:  Family History  Problem Relation Age of Onset  . Anxiety disorder Mother   . Depression Mother   . OCD Mother   . Hypertension Mother   . Cancer Maternal Aunt   .  Cancer Maternal Grandmother   . Hypertension Father   . Schizophrenia Maternal Uncle    Family Psychiatric  History:  Social History:  Social History   Substance and Sexual Activity  Alcohol Use No  . Alcohol/week: 0.0 standard drinks     Social History   Substance and Sexual Activity  Drug Use No    Social History   Socioeconomic History  . Marital status: Single    Spouse name: Not on file  . Number of children: Not on file  . Years of  education: Not on file  . Highest education level: Not on file  Occupational History  . Not on file  Tobacco Use  . Smoking status: Former Smoker    Packs/day: 1.00    Years: 1.00    Pack years: 1.00    Types: Cigarettes    Quit date: 12/31/2014    Years since quitting: 5.5  . Smokeless tobacco: Never Used  Substance and Sexual Activity  . Alcohol use: No    Alcohol/week: 0.0 standard drinks  . Drug use: No  . Sexual activity: Not Currently    Birth control/protection: None  Other Topics Concern  . Not on file  Social History Narrative   [name] is a high school drop out.   He lives with his mom only. He has one sister.   He enjoys eating, sleeping, and watching tv.   Social Determinants of Health   Financial Resource Strain: Not on file  Food Insecurity: Not on file  Transportation Needs: Not on file  Physical Activity: Not on file  Stress: Not on file  Social Connections: Not on file   Additional Social History:    Allergies:  No Known Allergies  Labs:  Results for orders placed or performed during the hospital encounter of 07/20/20 (from the past 48 hour(s))  Basic metabolic panel     Status: None   Collection Time: 07/20/20  6:05 PM  Result Value Ref Range   Sodium 140 135 - 145 mmol/L   Potassium 3.8 3.5 - 5.1 mmol/L   Chloride 101 98 - 111 mmol/L   CO2 24 22 - 32 mmol/L   Glucose, Bld 78 70 - 99 mg/dL    Comment: Glucose reference range applies only to samples taken after fasting for at least 8 hours.   BUN 8 6 - 20 mg/dL   Creatinine, Ser 3.97 0.61 - 1.24 mg/dL   Calcium 9.1 8.9 - 67.3 mg/dL   GFR, Estimated >41 >93 mL/min    Comment: (NOTE) Calculated using the CKD-EPI Creatinine Equation (2021)    Anion gap 15 5 - 15    Comment: Performed at [facility], [address] [place], [place] [address]  CBC with Differential     Status: None   Collection Time: 07/20/20  6:05 PM  Result Value Ref Range   WBC 6.3 4.0 - 10.5 K/uL   RBC 5.58 4.22  - 5.81 MIL/uL   Hemoglobin 16.3 13.0 - 17.0 g/dL   HCT 09.7 35.3 - 29.9 %   MCV 86.9 80.0 - 100.0 fL   MCH 29.2 26.0 - 34.0 pg   MCHC 33.6 30.0 - 36.0 g/dL   RDW 24.2 68.3 - 41.9 %   Platelets 310 150 - 400 K/uL   nRBC 0.0 0.0 - 0.2 %   Neutrophils Relative % 44 %   Neutro Abs 2.8 1.7 - 7.7 K/uL   Lymphocytes Relative 43 %   Lymphs Abs 2.7 0.7 -  4.0 K/uL   Monocytes Relative 9 %   Monocytes Absolute 0.6 0.1 - 1.0 K/uL   Eosinophils Relative 3 %   Eosinophils Absolute 0.2 0.0 - 0.5 K/uL   Basophils Relative 1 %   Basophils Absolute 0.1 0.0 - 0.1 K/uL   Immature Granulocytes 0 %   Abs Immature Granulocytes 0.02 0.00 - 0.07 K/uL    Comment: Performed at [facility], [address] [place], [place] [address]  Urinalysis, Complete w Microscopic Urine, Random     Status: Abnormal   Collection Time: 07/20/20  6:05 PM  Result Value Ref Range   Color, Urine AMBER (A) YELLOW    Comment: BIOCHEMICALS MAY BE AFFECTED BY COLOR   APPearance TURBID (A) CLEAR   Specific Gravity, Urine 1.026 1.005 - 1.030   pH 8.0 5.0 - 8.0   Glucose, UA NEGATIVE NEGATIVE mg/dL   Hgb urine dipstick NEGATIVE NEGATIVE   Bilirubin Urine NEGATIVE NEGATIVE   Ketones, ur 20 (A) NEGATIVE mg/dL   Protein, ur 401 (A) NEGATIVE mg/dL   Nitrite NEGATIVE NEGATIVE   Leukocytes,Ua NEGATIVE NEGATIVE   RBC / HPF 0-5 0 - 5 RBC/hpf   WBC, UA 21-50 0 - 5 WBC/hpf   Bacteria, UA NONE SEEN NONE SEEN   Squamous Epithelial / LPF NONE SEEN 0 - 5   Mucus PRESENT     Comment: Performed at [facility], [address] [place], [place] [address]    No current facility-administered medications for this encounter.   Current Outpatient Medications  Medication Sig Dispense Refill  . carbamazepine (TEGRETOL) 200 MG tablet Take 400 mg by mouth at bedtime.    Marland Kitchen omeprazole (PRILOSEC) 20 MG capsule Take 20 mg by mouth daily.    . ziprasidone (GEODON) 40 MG capsule Take 40 mg by mouth 2 (two) times daily with a  meal.      Musculoskeletal: Strength & Muscle Tone: within normal limits Gait & Station: normal Patient leans: N/A  Psychiatric Specialty Exam: Physical Exam Vitals and nursing note reviewed.  Constitutional:      Appearance: He is obese.  HENT:     Right Ear: External ear normal.     Left Ear: External ear normal.     Nose: Nose normal.     Mouth/Throat:     Mouth: Mucous membranes are moist.  Cardiovascular:     Rate and Rhythm: Normal rate.     Pulses: Normal pulses.  Pulmonary:     Effort: Pulmonary effort is normal.  Musculoskeletal:        General: Normal range of motion.     Cervical back: Normal range of motion and neck supple.  Neurological:     Mental Status: He is alert and oriented to person, place, and time. Mental status is at baseline.  Psychiatric:        Attention and Perception: Attention and perception normal.        Mood and Affect: Mood and affect normal.        Speech: Speech is delayed.        Behavior: Behavior is cooperative.        Thought Content: Thought content is paranoid.        Cognition and Memory: Cognition and memory normal.        Judgment: Judgment normal.     Review of Systems  Psychiatric/Behavioral: Positive for sleep disturbance. The patient is nervous/anxious.   All other systems reviewed and are negative.   Blood  pressure (!) 141/87, pulse 86, temperature 98.7 F (37.1 C), temperature source Oral, resp. rate 18, height 6\' 4"  (1.93 m), weight 135 kg, SpO2 97 %.Body mass index is 36.23 kg/m.  General Appearance: Casual and Guarded  Eye Contact:  Good  Speech:  Clear and Coherent and Slow  Volume:  Normal  Mood:  Anxious and Euthymic  Affect:  Blunt, Congruent and Flat  Thought Process:  Coherent  Orientation:  Full (Time, Place, and Person)  Thought Content:  Logical and Paranoid Ideation  Suicidal Thoughts:  No  Homicidal Thoughts:  No  Memory:  Immediate;   Good Recent;   Good Remote;   Good  Judgement:  Fair   Insight:  Fair  Psychomotor Activity:  Normal  Concentration:  Concentration: Good and Attention Span: Good  Recall:  Good  Fund of Knowledge:  Good  Language:  Good  Akathisia:  Negative  Handed:  Right  AIMS (if indicated):     Assets:  Communication Skills Desire for Improvement Resilience Social Support  ADL's:  Intact  Cognition:  WNL  Sleep:   Insomnia     Treatment Plan Summary: Plan The patient is not a safety risk to himself or others and does not require psychiatric inpatient admission for stabilization and treatment. Needs to be followed up as an outpatient.  Disposition: No evidence of imminent risk to self or others at present.   Patient does not meet criteria for psychiatric inpatient admission. Supportive therapy provided about ongoing stressors. Discussed crisis plan, support from social network, calling 911, coming to the Emergency Department, and calling Suicide Hotline.  , NP 07/20/2020 9:30 PM

## 2020-07-20 NOTE — ED Provider Notes (Signed)
[facility] Emergency Department Provider Note  ____________________________________________   Event Date/Time   First MD Initiated Contact with Patient 07/20/20 1732     (approximate)  I have reviewed the triage vital signs and the nursing notes.   HISTORY  Chief Complaint Fatigue  HPI Connor [name] is a 20 y.o. male with the below medical history, reports being "exposed to radiation" nearly 2 weeks ago. He describes the offending incidents as the previous CXRs and chest CT he had performed on Jan. 13 & 14, due to medical complaints. Patient has had several ED visits, including an IVC since that time, related to his concern over ongoing symptoms related to his imaging. He goes on to endorse bilateral leg burning sensations, and feeling like "everything is bright". He denies chest pain, fevers, SOB, N/V, or headaches. He reports taking his prescription meds as prescribed. He is requesting genetic testing to further evaluate his concerns.       Past Medical History:  Diagnosis Date  . ADHD (attention deficit hyperactivity disorder)   . Autism   . Depressed   . Neurofibromatosis (HCC)    Type 1  . Obesity   . Pertussis    as a infant    Patient Active Problem List   Diagnosis Date Noted  . 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 ADHD (attention deficit hyperactivity disorder) 07/08/2017  . Personality disorder (HCC) 07/08/2017  . Insomnia 06/09/2017  . Aggression 01/31/2016  . 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 Autism spectrum disorder associated with known medical or genetic condition or environmental factor, requiring substantial support (level 2) 11/06/2015  . 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 MRI    . RADIOLOGY WITH ANESTHESIA  N/A 08/13/2012   Procedure: RADIOLOGY WITH ANESTHESIA;  Surgeon: Medication Radiologist, MD;  Location: [facility] OR;  Service: Radiology;  Laterality: N/A;  MRI     Prior to Admission medications   Medication Sig Start Date End Date Taking? Authorizing Provider  omeprazole (PRILOSEC) 20 MG capsule Take 20 mg by mouth daily. 07/05/20   [provider]  ziprasidone (GEODON) 40 MG capsule Take 40 mg by mouth 2 (two) times daily with a meal.    [provider]    Allergies Patient has no known allergies.  Family History  Problem Relation Age of Onset  . Anxiety disorder Mother   . Depression Mother   . OCD Mother   . Hypertension Mother   . Cancer Maternal Aunt   . Cancer Maternal Grandmother   . Hypertension Father   . Schizophrenia Maternal Uncle     Social History Social History   Tobacco Use  . Smoking status: Former Smoker    Packs/day: 1.00    Years: 1.00    Pack years: 1.00    Types: Cigarettes    Quit date: 12/31/2014    Years since quitting: 5.5  . Smokeless tobacco: Never Used  Substance Use Topics  . Alcohol use: No    Alcohol/week: 0.0 standard drinks  . Drug use: No    Review of Systems  Constitutional: No fever/chills Eyes: No visual changes. ENT: No sore throat. Cardiovascular: Denies chest pain. Respiratory: Denies shortness of breath. Gastrointestinal: No abdominal pain.  No nausea, no vomiting.  No diarrhea.  No constipation. Genitourinary: Negative for dysuria. Musculoskeletal: Negative  for back pain. Skin: Negative for rash. Neurological: Negative for headaches, focal weakness or numbness. Psychiatric: Positive for anxiety, paranoia, somatization ____________________________________________   PHYSICAL EXAM:  VITAL SIGNS: ED Triage Vitals  Enc Vitals Group     BP 07/20/20 1530 (!) 137/97     Pulse Rate 07/20/20 1530 (!) 107     Resp 07/20/20 1530 18     Temp 07/20/20 1530 98.7 F (37.1 C)     Temp Source 07/20/20 1530 Oral      SpO2 07/20/20 1530 96 %     Weight 07/20/20 1536 297 lb 9.9 oz (135 kg)     Height 07/20/20 1536 6\' 4"  (1.93 m)     Head Circumference --      Peak Flow --      Pain Score 07/20/20 1536 0     Pain Loc --      Pain Edu? --      Excl. in GC? --     Constitutional: Alert and oriented. Well appearing and in no acute distress. Eyes: Conjunctivae are normal. PERRL. EOMI. Head: Atraumatic. Cardiovascular: Normal rate, regular rhythm. Grossly normal heart sounds.  Good peripheral circulation. Respiratory: Normal respiratory effort.  No retractions. Lungs CTAB. Gastrointestinal: Soft and nontender. No distention. No abdominal bruits. No CVA tenderness. Musculoskeletal: No lower extremity tenderness nor edema.  No joint effusions. Neurologic:  Normal speech and language. No gross focal neurologic deficits are appreciated. No gait instability. Skin:  Skin is warm, dry and intact. No rash noted. Psychiatric: Mood is paranoid and affect is anxious. Speech and thoughts are delayed and behavior is calm and appropriate. ____________________________________________   LABS (all labs ordered are listed, but only abnormal results are displayed)  Labs Reviewed - No data to display ____________________________________________  RADIOLOGY I, 07/22/20, personally viewed and evaluated these images (plain radiographs) as part of my medical decision making, as well as reviewing the written report by the radiologist.  ED MD interpretation:  N/A  Official radiology report(s): No results found.  ____________________________________________   PROCEDURES  Procedure(s) performed (including Critical Care):  Procedures   ____________________________________________   INITIAL IMPRESSION / ASSESSMENT AND PLAN / ED COURSE  As part of my medical decision making, I reviewed the following data within the electronic MEDICAL RECORD NUMBER Labs reviewed WNL, Old chart reviewed, Notes from prior ED  visits and [place] Controlled Substance Database     Patient with a recurrent presentation for concern over radiation exposure related to routine CXRs. He has had multiple visits including an IVC over the last month for the same paranoia of a condition without a diagnosis. He is requesting genetic testing to further evaluate his concerns. He will be evaluated the psyche provider for admission. Labs are pending.    ___________________________________________   FINAL CLINICAL IMPRESSION(S) / ED DIAGNOSES  Final diagnoses:  Constipation, unspecified constipation type  Chronic fatigue  Nausea  Paranoia [facility])     ED Discharge Orders    None      *Please note:  Connor [name] was evaluated in Emergency Department on 07/20/2020 for the symptoms described in the history of present illness. He was evaluated in the context of the global COVID-19 pandemic, which necessitated consideration that the patient might be at risk for infection with the SARS-CoV-2 virus that causes COVID-19. Institutional protocols and algorithms that pertain to the evaluation of patients at risk for COVID-19 are in a state of rapid change based on information released by regulatory bodies  including the CDC and federal and state organizations. These policies and algorithms were followed during the patient's care in the ED.  Some ED evaluations and interventions may be delayed as a result of limited staffing during and the pandemic.*   Note:  This document was prepared using Dragon voice recognition software and may include unintentional dictation errors.    Lissa Hoard, PA-C 07/21/20 0011    Merwyn Katos, MD 07/21/20 [phone]

## 2020-07-20 NOTE — ED Notes (Signed)
Spoke to [place] in the lab.  Will add on the Chlamydia to the urine spec already in lab.  Was not a clean catch spec.

## 2020-07-20 NOTE — ED Notes (Signed)
Pt comes with c/o radiation exposure and constipation. Pt recently seen for same.

## 2020-07-20 NOTE — ED Notes (Signed)
x2 staff unable to draw blood, lab called

## 2020-07-20 NOTE — ED Provider Notes (Incomplete)
[facility] Emergency Department Provider Note  ____________________________________________   Event Date/Time   First MD Initiated Contact with Patient 07/20/20 1732     (approximate)  I have reviewed the triage vital signs and the nursing notes.   HISTORY  Chief Complaint Fatigue  HPI Connor [name] is a 20 y.o. male with the below medical history, reports being "exposed to radiation" nearly 2 weeks ago. He describes the offending incidents as the previous CXRs and chest CT he had performed on Jan. 13 & 14, due to medical complaints. Patient has had several ED visits, including an IVC since that time, related to his concern over ongoing symptoms related to his imaging. He goes on to endorse bilateral leg burning sensations, and feeling like "everything is bright". He denies chest pain, fevers, SOB, N/V, or headaches.         {**SYMPTOM/COMPLAINT  LOCATION (describe anatomically) DURATION (when did it start) TIMING (onset and pattern) SEVERITY (0-10, mild/moderate/severe) QUALITY (description of symptoms) CONTEXT (recent surgery, new meds, activity, etc.) MODIFYINGFACTORS (what makes it better/worse) ASSOCIATEDSYMPTOMS (pertinent positives and negatives)**} Past Medical History:  Diagnosis Date  . ADHD (attention deficit hyperactivity disorder)   . Autism   . Depressed   . Neurofibromatosis (HCC)    Type 1  . Obesity   . Pertussis    as a infant    Patient Active Problem List   Diagnosis Date Noted  . 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 ADHD (attention deficit hyperactivity disorder) 07/08/2017  . Personality disorder (HCC) 07/08/2017  . Insomnia 06/09/2017  . Aggression 01/31/2016  . 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 Autism spectrum disorder associated with known medical or genetic condition or environmental factor, requiring substantial support (level 2) 11/06/2015  .  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 MRI    . RADIOLOGY WITH ANESTHESIA N/A 08/13/2012   Procedure: RADIOLOGY WITH ANESTHESIA;  Surgeon: Medication Radiologist, MD;  Location: [facility] OR;  Service: Radiology;  Laterality: N/A;  MRI     Prior to Admission medications   Medication Sig Start Date End Date Taking? Authorizing Provider  omeprazole (PRILOSEC) 20 MG capsule Take 20 mg by mouth daily. 07/05/20   [provider]  ziprasidone (GEODON) 40 MG capsule Take 40 mg by mouth 2 (two) times daily with a meal.    [provider]    Allergies Patient has no known allergies.  Family History  Problem Relation Age of Onset  . Anxiety disorder Mother   . Depression Mother   . OCD Mother   . Hypertension Mother   . Cancer Maternal Aunt   . Cancer Maternal Grandmother   . Hypertension Father   . Schizophrenia Maternal Uncle     Social History Social History   Tobacco Use  . Smoking status: Former Smoker    Packs/day: 1.00    Years: 1.00    Pack years: 1.00    Types: Cigarettes    Quit date: 12/31/2014    Years since quitting: 5.5  . Smokeless tobacco: Never Used  Substance Use Topics  . Alcohol use: No    Alcohol/week: 0.0 standard drinks  . Drug use: No    Review of Systems {** Revise as appropriate then delete this line - Documentation of 10 systems is required  **} Constitutional: No fever/chills  Eyes: No visual changes. ENT: No sore throat. Cardiovascular: Denies chest pain. Respiratory: Denies shortness of breath. Gastrointestinal: No abdominal pain.  No nausea, no vomiting.  No diarrhea.  No constipation. Genitourinary: Negative for dysuria. Musculoskeletal: Negative for back pain. Skin: Negative for rash. Neurological: Negative for headaches, focal weakness or numbness. {**Psychiatric:  Endocrine:   Hematological/Lymphatic:  Allergic/Immunilogical: **}  ____________________________________________   PHYSICAL EXAM:  VITAL SIGNS: ED Triage Vitals  Enc Vitals Group     BP 07/20/20 1530 (!) 137/97     Pulse Rate 07/20/20 1530 (!) 107     Resp 07/20/20 1530 18     Temp 07/20/20 1530 98.7 F (37.1 C)     Temp Source 07/20/20 1530 Oral     SpO2 07/20/20 1530 96 %     Weight 07/20/20 1536 297 lb 9.9 oz (135 kg)     Height 07/20/20 1536 6\' 4"  (1.93 m)     Head Circumference --      Peak Flow --      Pain Score 07/20/20 1536 0     Pain Loc --      Pain Edu? --      Excl. in GC? --    {** Revise as appropriate then delete this line - 8 systems required **} Constitutional: Alert and oriented. Well appearing and in no acute distress. Eyes: Conjunctivae are normal. PERRL. EOMI. Head: Atraumatic. Nose: No congestion/rhinnorhea. Mouth/Throat: Mucous membranes are moist.  Oropharynx non-erythematous. Neck: No stridor.  {**No cervical spine tenderness to palpation.**} {**Hematological/Lymphatic/Immunilogical: No cervical lymphadenopathy. **}Cardiovascular: Normal rate, regular rhythm. Grossly normal heart sounds.  Good peripheral circulation. Respiratory: Normal respiratory effort.  No retractions. Lungs CTAB. Gastrointestinal: Soft and nontender. No distention. No abdominal bruits. No CVA tenderness. {**Genitourinary:  **}Musculoskeletal: No lower extremity tenderness nor edema.  No joint effusions. Neurologic:  Normal speech and language. No gross focal neurologic deficits are appreciated. No gait instability. Skin:  Skin is warm, dry and intact. No rash noted. Psychiatric: Mood and affect are normal. Speech and behavior are normal.  ____________________________________________   LABS (all labs ordered are listed, but only abnormal results are displayed)  Labs Reviewed - No data to  display ____________________________________________  EKG  *** ____________________________________________  RADIOLOGY I, 07/22/20, personally viewed and evaluated these images (plain radiographs) as part of my medical decision making, as well as reviewing the written report by the radiologist.  ED MD interpretation:  ***  Official radiology report(s): No results found.  ____________________________________________   PROCEDURES  Procedure(s) performed (including Critical Care):  Procedures   ____________________________________________   INITIAL IMPRESSION / ASSESSMENT AND PLAN / ED COURSE  As part of my medical decision making, I reviewed the following data within the electronic MEDICAL RECORD NUMBER {[id number]::"Notes from prior ED visits","[place] Controlled Substance Database"}        ***      ____________________________________________   FINAL CLINICAL IMPRESSION(S) / ED DIAGNOSES  Final diagnoses:  Constipation, unspecified constipation type  Chronic fatigue  Nausea  Paranoia (HCC)     ED Discharge Orders    None      *Please note:  Connor [name] was evaluated in Emergency Department on 07/20/2020 for the symptoms described in the history of present illness. He was evaluated in the context of the global COVID-19 pandemic, which necessitated consideration that the patient might be at risk for infection with the SARS-CoV-2 virus that causes COVID-19. Institutional protocols and algorithms that pertain to the evaluation of patients  at risk for COVID-19 are in a state of rapid change based on information released by regulatory bodies including the CDC and federal and state organizations. These policies and algorithms were followed during the patient's care in the ED.  Some ED evaluations and interventions may be delayed as a result of limited staffing during and the pandemic.*   Note:  This document was prepared using Dragon voice  recognition software and may include unintentional dictation errors.

## 2020-07-21 LAB — RPR: RPR Ser Ql: NONREACTIVE

## 2020-07-22 NOTE — ED Provider Notes (Signed)
[facility] ED interpretation: Sinus tachycardia w/ a rate of 108. Normal axis. Unremarkable intervals. No evidence of acute ischemia.    Gilles Chiquito, MD 07/22/20 [phone]

## 2020-07-25 ENCOUNTER — Encounter: Payer: Self-pay | Admitting: Emergency Medicine

## 2020-07-25 ENCOUNTER — Emergency Department
Admission: EM | Admit: 2020-07-25 | Discharge: 2020-07-25 | Disposition: A | Discharge status: Home / Self Care | Payer: No Typology Code available for payment source | Attending: Emergency Medicine | Admitting: Emergency Medicine

## 2020-07-25 ENCOUNTER — Other Ambulatory Visit: Payer: Self-pay

## 2020-07-25 DIAGNOSIS — Z87891 Personal history of nicotine dependence: Secondary | ICD-10-CM | POA: Diagnosis not present

## 2020-07-25 DIAGNOSIS — F840 Autistic disorder: Secondary | ICD-10-CM | POA: Insufficient documentation

## 2020-07-25 DIAGNOSIS — R531 Weakness: Secondary | ICD-10-CM | POA: Insufficient documentation

## 2020-07-25 DIAGNOSIS — F22 Delusional disorders: Secondary | ICD-10-CM | POA: Diagnosis not present

## 2020-07-25 DIAGNOSIS — F909 Attention-deficit hyperactivity disorder, unspecified type: Secondary | ICD-10-CM | POA: Insufficient documentation

## 2020-07-25 NOTE — ED Triage Notes (Signed)
Pt comes into the ED via [facility] from home c/o fatigue from "radiation exposure".  Pt states he was exposed 07/06/20.  Pt states he is still having light sensitivity, memory loss, loss of skin cells, etc.  Pt has h/o paranoia and autism per EMS.  Pt ambulatory to triage and in NAD. Pt has been seen multiple time since 07/06/20 for the same problem.

## 2020-07-25 NOTE — ED Provider Notes (Signed)
[facility] Emergency Department Provider Note   ____________________________________________    I have reviewed the triage vital signs and the nursing notes.   HISTORY  Chief Complaint Fatigue     HPI Connor [name] is a 20 y.o. male with a history of ADHD, autism, depression, adjustment disorder who presents with paranoia related to being exposed to radiation, he states that I just do not feel well.  He denies SI or HI.  Was seen here on January 27, medical records reviewed.  He presents for psychiatric evaluation  Past Medical History:  Diagnosis Date  . ADHD (attention deficit hyperactivity disorder)   . Autism   . Depressed   . Neurofibromatosis (HCC)    Type 1  . Obesity   . Pertussis    as a infant    Patient Active Problem List   Diagnosis Date Noted  . 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 ADHD (attention deficit hyperactivity disorder) 07/08/2017  . Personality disorder (HCC) 07/08/2017  . Insomnia 06/09/2017  . Aggression 01/31/2016  . 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 Autism spectrum disorder associated with known medical or genetic condition or environmental factor, requiring substantial support (level 2) 11/06/2015  . 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 MRI    . RADIOLOGY WITH ANESTHESIA N/A 08/13/2012   Procedure: RADIOLOGY WITH ANESTHESIA;  Surgeon: Medication Radiologist, MD;  Location: [facility] OR;  Service: Radiology;  Laterality: N/A;  MRI     Prior to Admission medications   Medication Sig Start Date End Date Taking? Authorizing Provider  carbamazepine (TEGRETOL) 200 MG tablet Take 400 mg by mouth at bedtime.    [provider]  omeprazole (PRILOSEC) 20 MG capsule Take 20 mg by mouth daily. 07/05/20    [provider]  ziprasidone (GEODON) 40 MG capsule Take 40 mg by mouth 2 (two) times daily with a meal.    [provider]     Allergies Patient has no known allergies.  Family History  Problem Relation Age of Onset  . Anxiety disorder Mother   . Depression Mother   . OCD Mother   . Hypertension Mother   . Cancer Maternal Aunt   . Cancer Maternal Grandmother   . Hypertension Father   . Schizophrenia Maternal Uncle     Social History Social History   Tobacco Use  . Smoking status: Former Smoker    Packs/day: 1.00    Years: 1.00    Pack years: 1.00    Types: Cigarettes    Quit date: 12/31/2014    Years since quitting: 5.5  . Smokeless tobacco: Never Used  Substance Use Topics  . Alcohol use: No    Alcohol/week: 0.0 standard drinks  . Drug use: No    Review of Systems  Constitutional: fatigue and weakness Eyes: No visual changes.  ENT: No sore throat. Cardiovascular: Denies chest pain. Respiratory: Denies shortness of breath. Gastrointestinal: No abdominal pain.   Genitourinary: Negative for dysuria. Musculoskeletal: Negative for back pain. Skin: Negative for rash. Neurological: Negative for headaches or weakness   ____________________________________________   PHYSICAL EXAM:  VITAL SIGNS: ED Triage Vitals  Enc Vitals Group     BP 07/25/20 1048 118/77     Pulse Rate 07/25/20 1048 (!) 105     Resp 07/25/20 1048 16  Temp 07/25/20 1048 98.2 F (36.8 C)     Temp Source 07/25/20 1048 Oral     SpO2 07/25/20 1048 96 %     Weight 07/25/20 1050 135.6 kg (299 lb)     Height 07/25/20 1050 1.905 m (6\' 3" )     Head Circumference --      Peak Flow --      Pain Score 07/25/20 1050 10     Pain Loc --      Pain Edu? --      Excl. in GC? --    Mild mixed Constitutional: Alert and oriented. No acute distress.  Nose: No congestion/rhinnorhea. Mouth/Throat: Mucous membranes are moist.   Neck:  Painless ROM Cardiovascular: Normal rate,  regular rhythm.  Good peripheral circulation. Respiratory: Normal respiratory effort.  No retractions Gastrointestinal: Soft and nontender. No distention.  No CVA tenderness. Musculoskeletal: .  Warm and well perfused Neurologic:  Normal speech and language. No gross focal neurologic deficits are appreciated.  Skin:  Skin is warm, dry and intact. No rash noted. Psychiatric: Mood and affect are normal. Speech and behavior are normal.  ____________________________________________   LABS (all labs ordered are listed, but only abnormal results are displayed)  Labs Reviewed - No data to display ____________________________________________  EKG   ____________________________________________  RADIOLOGY   ____________________________________________   PROCEDURES  Procedure(s) performed: No  Procedures   Critical Care performed: No ____________________________________________   INITIAL IMPRESSION / ASSESSMENT AND PLAN / ED COURSE  Pertinent labs & imaging results that were available during my care of the patient were reviewed by me and considered in my medical decision making (see chart for details).   Patient well-appearing and in no acute distress, medically cleared for psychiatric evaluation  Appreciate Dr. 09/22/20 consultation, he feels that patient is appropriate for discharge and outpatient follow-up, no further work-up needed at this time   ________________________________________   FINAL CLINICAL IMPRESSION(S) / ED DIAGNOSES  Final diagnoses:  Generalized weakness  Paranoia (HCC)        Note:  This document was prepared using Dragon voice recognition software and may include unintentional dictation errors.   Jacques Earthly, MD 07/25/20 [phone]

## 2020-07-25 NOTE — ED Notes (Signed)
Patient seen and evaluated by dr clapacs, discharged to home with follow up instructions.

## 2020-07-26 ENCOUNTER — Encounter: Payer: Self-pay | Admitting: Emergency Medicine

## 2020-07-26 ENCOUNTER — Other Ambulatory Visit
Admission: RE | Admit: 2020-07-26 | Discharge: 2020-07-26 | Disposition: A | Discharge status: Home / Self Care | Payer: Medicare Other | Source: Ambulatory Visit | Attending: Gastroenterology | Admitting: Gastroenterology

## 2020-07-26 ENCOUNTER — Encounter: Payer: Self-pay | Admitting: Gastroenterology

## 2020-07-26 ENCOUNTER — Emergency Department
Admission: EM | Admit: 2020-07-26 | Discharge: 2020-07-26 | Disposition: A | Discharge status: Home / Self Care | Payer: Medicare Other | Attending: Emergency Medicine | Admitting: Emergency Medicine

## 2020-07-26 ENCOUNTER — Ambulatory Visit (INDEPENDENT_AMBULATORY_CARE_PROVIDER_SITE_OTHER): Payer: Medicare Other | Admitting: Gastroenterology

## 2020-07-26 ENCOUNTER — Other Ambulatory Visit: Payer: Self-pay

## 2020-07-26 VITALS — BP 123/88 | HR 105 | Temp 98.6°F | Ht 75.0 in | Wt 296.1 lb

## 2020-07-26 DIAGNOSIS — R1030 Lower abdominal pain, unspecified: Secondary | ICD-10-CM | POA: Diagnosis present

## 2020-07-26 DIAGNOSIS — K30 Functional dyspepsia: Secondary | ICD-10-CM | POA: Diagnosis not present

## 2020-07-26 DIAGNOSIS — R5383 Other fatigue: Secondary | ICD-10-CM | POA: Insufficient documentation

## 2020-07-26 DIAGNOSIS — K219 Gastro-esophageal reflux disease without esophagitis: Secondary | ICD-10-CM

## 2020-07-26 DIAGNOSIS — K5904 Chronic idiopathic constipation: Secondary | ICD-10-CM | POA: Diagnosis not present

## 2020-07-26 DIAGNOSIS — F419 Anxiety disorder, unspecified: Secondary | ICD-10-CM | POA: Insufficient documentation

## 2020-07-26 DIAGNOSIS — Z87891 Personal history of nicotine dependence: Secondary | ICD-10-CM | POA: Insufficient documentation

## 2020-07-26 DIAGNOSIS — F418 Other specified anxiety disorders: Secondary | ICD-10-CM

## 2020-07-26 DIAGNOSIS — F840 Autistic disorder: Secondary | ICD-10-CM | POA: Insufficient documentation

## 2020-07-26 MED ORDER — OMEPRAZOLE 40 MG PO CPDR: 40.0000 mg | DELAYED_RELEASE_CAPSULE | Freq: Two times a day (BID) | ORAL | 0 refills | Status: DC

## 2020-07-26 MED ORDER — POLYETHYLENE GLYCOL 3350 17 GM/SCOOP PO POWD: 1.0000 | Freq: Every day | ORAL | 3 refills | Status: DC

## 2020-07-26 NOTE — Progress Notes (Signed)
Arlyss Repress, MD [address]  [address]  [place], [place] [address]  Main: [phone]  Fax: [phone]    Gastroenterology Consultation  Referring Provider:     Abram Sander, MD Primary Care Physician:  Abram Sander, MD Primary Gastroenterologist:  Dr. Arlyss Repress Reason for Consultation:     Irregular bowel habits, heartburn        HPI:   Pancho Rushing III is a 20 y.o. male referred by Dr. Abram Sander, MD  for consultation & management of irregular bowel habits and heartburn.  Patient reports that he has been experiencing alternating episodes of diarrhea and constipation.  This has been been ongoing for quite some time, has gotten worse in more than a month.  Patient's mom showed me the pictures of his bowel movements, they were liquid, greenish and sometimes brown.  He denies any rectal bleeding, he also reports heartburn which is intermittent and is not on any acid suppressive therapy.  Patient is accompanied by her mom who is worried and she is requesting for endoscopic evaluation.  His diet is devoid of fiber.  He is currently on disability, stays at home with her mom.  Most recent labs in January revealed normal CBC, CMP.  Patient is referred to me for management of constipation  Patient does not smoke or drink alcohol  NSAIDs: None  Antiplts/Anticoagulants/Anti thrombotics: None  GI Procedures: None  Past Medical History:  Diagnosis Date  . ADHD (attention deficit hyperactivity disorder)   . Autism   . Depressed   . Neurofibromatosis (HCC)    Type 1  . Obesity   . Pertussis    as a infant    Past Surgical History:  Procedure Laterality Date  . MRI    . RADIOLOGY WITH ANESTHESIA N/A 08/13/2012   Procedure: RADIOLOGY WITH ANESTHESIA;  Surgeon: Medication Radiologist, MD;  Location: [facility] OR;  Service: Radiology;  Laterality: N/A;  MRI     Current Outpatient Medications:  .  carbamazepine (TEGRETOL) 200 MG tablet, Take 400 mg by mouth  at bedtime., Disp: , Rfl:  .  desvenlafaxine (PRISTIQ) 100 MG 24 hr tablet, Take by mouth., Disp: , Rfl:  .  doxepin (SINEQUAN) 10 MG capsule, Take 10 mg by mouth at bedtime., Disp: , Rfl:  .  omeprazole (PRILOSEC) 40 MG capsule, Take 1 capsule (40 mg total) by mouth 2 (two) times daily before a meal., Disp: 60 capsule, Rfl: 0 .  polyethylene glycol powder (GLYCOLAX/MIRALAX) 17 GM/SCOOP powder, Take 255 g by mouth daily., Disp: 255 g, Rfl: 3 .  ziprasidone (GEODON) 40 MG capsule, Take 40 mg by mouth 2 (two) times daily with a meal., Disp: , Rfl:    Family History  Problem Relation Age of Onset  . Anxiety disorder Mother   . Depression Mother   . OCD Mother   . Hypertension Mother   . Cancer Maternal Aunt   . Cancer Maternal Grandmother   . Hypertension Father   . Schizophrenia Maternal Uncle      Social History   Tobacco Use  . Smoking status: Former Smoker    Packs/day: 1.00    Years: 1.00    Pack years: 1.00    Types: Cigarettes    Quit date: 12/31/2014    Years since quitting: 5.5  . Smokeless tobacco: Never Used  Substance Use Topics  . Alcohol use: No    Alcohol/week: 0.0 standard drinks  . Drug use: No  Allergies as of 07/26/2020  . (No Known Allergies)    Review of Systems:    All systems reviewed and negative except where noted in HPI.   Physical Exam:  BP 123/88 (BP Location: Left Arm, Patient Position: Sitting, Cuff Size: Normal)   Pulse (!) 105   Temp 98.6 F (37 C) (Oral)   Ht 6\' 3"  (1.905 m)   Wt 296 lb 2 oz (134.3 kg)   BMI 37.01 kg/m  No LMP for male patient.  General:   Alert,  Well-developed, well-nourished, pleasant and cooperative in NAD Head:  Normocephalic and atraumatic. Eyes:  Sclera clear, no icterus.   Conjunctiva pink. Ears:  Normal auditory acuity. Nose:  No deformity, discharge, or lesions. Mouth:  No deformity or lesions,oropharynx pink & moist. Neck:  Supple; no masses or thyromegaly. Lungs:  Respirations even and unlabored.   Clear throughout to auscultation.   No wheezes, crackles, or rhonchi. No acute distress. Heart:  Regular rate and rhythm; no murmurs, clicks, rubs, or gallops. Abdomen:  Normal bowel sounds. Soft, obese, non-tender and non-distended without masses, hepatosplenomegaly or hernias noted.  No guarding or rebound tenderness.   Rectal: Not performed Msk:  Symmetrical without gross deformities. Good, equal movement & strength bilaterally. Pulses:  Normal pulses noted. Extremities:  No clubbing or edema.  No cyanosis. Neurologic:  Alert and oriented x3;  grossly normal neurologically. Skin:  Intact without significant lesions or rashes. No jaundice. Lymph Nodes:  No significant cervical adenopathy. Psych:  Alert and cooperative. Normal mood and affect.  Imaging Studies: Reviewed  Assessment and Plan:   Jaydn Moscato III is a 20 y.o. male with history of autism per patient's mom, underlying psychological condition is seen in consultation for irregular bowel habits as well as heartburn.  I have tried to reassure patient and his mom that he does not need any urgent endoscopic evaluation at this time.  Patient said he did not have a bowel movement for 1 week, therefore, I have advised him to have MiraLAX bowel prep to clean out followed by trial of Linzess 145 MCG daily.  He agreed to this.  I also advised him to try Prilosec 40 mg twice daily for heartburn.  Recommend H. pylori breath test   Follow up in 3 months   21, MD

## 2020-07-26 NOTE — ED Notes (Signed)
Patient returned with same c/o of having been exposed to radiation, reports he does not want to see dr. Toni Amend, wants to [facility] emergency room physician. Patient awaiting ed clearance.

## 2020-07-26 NOTE — ED Provider Notes (Signed)
[facility] Emergency Department Provider Note   ____________________________________________   Event Date/Time   First MD Initiated Contact with Patient 07/26/20 1341     (approximate)  I have reviewed the triage vital signs and the nursing notes.   HISTORY  Chief Complaint Weakness   HPI Yeshaya Vath III is a 20 y.o. male with past medical history of autistic spectrum disorder and neurofibromatosis who presents to the ED for weakness and fatigue.  Patient reports that he has been feeling weak and fatigued with a feeling of burning in his epigastrium for the past 3 weeks.  He is concerned that he was exposed to radiation after receiving a CT scan here in the ED.  He states that he knows there is something wrong with him and he would like something done about it.  He denies any fevers, cough, chest pain, shortness of breath.  He has been seen in the ED for similar complaints on multiple occasions and states he does not want to be seen by psychiatry today.        Past Medical History:  Diagnosis Date  . ADHD (attention deficit hyperactivity disorder)   . Autism   . Depressed   . Neurofibromatosis (HCC)    Type 1  . Obesity   . Pertussis    as a infant    Patient Active Problem List   Diagnosis Date Noted  . 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 ADHD (attention deficit hyperactivity disorder) 07/08/2017  . Personality disorder (HCC) 07/08/2017  . Insomnia 06/09/2017  . Aggression 01/31/2016  . 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 Autism spectrum disorder associated with known medical or genetic condition or environmental factor, requiring substantial support (level 2) 11/06/2015  . 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 MRI    .  RADIOLOGY WITH ANESTHESIA N/A 08/13/2012   Procedure: RADIOLOGY WITH ANESTHESIA;  Surgeon: Medication Radiologist, MD;  Location: [facility] OR;  Service: Radiology;  Laterality: N/A;  MRI     Prior to Admission medications   Medication Sig Start Date End Date Taking? Authorizing Provider  carbamazepine (TEGRETOL) 200 MG tablet Take 400 mg by mouth at bedtime.    [provider]  desvenlafaxine (PRISTIQ) 100 MG 24 hr tablet Take by mouth.    [provider]  doxepin (SINEQUAN) 10 MG capsule Take 10 mg by mouth at bedtime. 02/17/20   [provider]  omeprazole (PRILOSEC) 40 MG capsule Take 1 capsule (40 mg total) by mouth 2 (two) times daily before a meal. 07/26/20 08/25/20  Vanga, Loel Dubonnet, MD  polyethylene glycol powder (GLYCOLAX/MIRALAX) 17 GM/SCOOP powder Take 255 g by mouth daily. 07/26/20   Toney Reil, MD  ziprasidone (GEODON) 40 MG capsule Take 40 mg by mouth 2 (two) times daily with a meal.    [provider]    Allergies Patient has no known allergies.  Family History  Problem Relation Age of Onset  . Anxiety disorder Mother   . Depression Mother   . OCD Mother   . Hypertension Mother   . Cancer Maternal Aunt   . Cancer Maternal Grandmother   . Hypertension Father   . Schizophrenia Maternal Uncle     Social History Social History   Tobacco Use  . Smoking status: Former Smoker    Packs/day:  1.00    Years: 1.00    Pack years: 1.00    Types: Cigarettes    Quit date: 12/31/2014    Years since quitting: 5.5  . Smokeless tobacco: Never Used  Substance Use Topics  . Alcohol use: No    Alcohol/week: 0.0 standard drinks  . Drug use: No    Review of Systems  Constitutional: No fever/chills.  Positive for generalized weakness and fatigue. Eyes: No visual changes. ENT: No sore throat. Cardiovascular: Denies chest pain. Respiratory: Denies shortness of breath. Gastrointestinal: No abdominal pain.  No nausea, no vomiting.  No diarrhea.   No constipation. Genitourinary: Negative for dysuria. Musculoskeletal: Negative for back pain. Skin: Negative for rash. Neurological: Negative for headaches, focal weakness or numbness.  ____________________________________________   PHYSICAL EXAM:  VITAL SIGNS: ED Triage Vitals [07/26/20 1241]  Enc Vitals Group     BP (!) 143/97     Pulse Rate 96     Resp 20     Temp 98.4 F (36.9 C)     Temp Source Oral     SpO2 98 %     Weight      Height      Head Circumference      Peak Flow      Pain Score 0     Pain Loc      Pain Edu?      Excl. in GC?     Constitutional: Alert and oriented. Eyes: Conjunctivae are normal. Head: Atraumatic. Nose: No congestion/rhinnorhea. Mouth/Throat: Mucous membranes are moist. Neck: Normal ROM Cardiovascular: Normal rate, regular rhythm. Grossly normal heart sounds. Respiratory: Normal respiratory effort.  No retractions. Lungs CTAB. Gastrointestinal: Soft and nontender. No distention. Genitourinary: deferred Musculoskeletal: No lower extremity tenderness nor edema. Neurologic:  Normal speech and language. No gross focal neurologic deficits are appreciated. Skin:  Skin is warm, dry and intact. No rash noted. Psychiatric: Mood and affect are normal. Speech and behavior are normal.  ____________________________________________   LABS (all labs ordered are listed, but only abnormal results are displayed)  Labs Reviewed - No data to display   PROCEDURES  Procedure(s) performed (including Critical Care):  Procedures   ____________________________________________   INITIAL IMPRESSION / ASSESSMENT AND PLAN / ED COURSE       20 year old male with past medical history of autistic spectrum disorder and neurofibromatosis who presents to the ED for ongoing generalized weakness and fatigue after he was concerned he was exposed to excessive radiation with a CT scan 3 weeks ago.  I had a long conversation with the patient explaining that  any dose of radiation from either CT scan or x-ray is extremely low and would not cause the symptoms he is experiencing.  Patient became agitated and upset with this explanation.  I offered to perform additional testing to ensure there is no other process causing his symptoms, however he declined this.  He also declined psychiatric evaluation.  He was seen by GI earlier today and started on medications for possible peptic ulcer disease/gastritis.  Nursing spoke with patient's mother over the phone, who feels that patient would be appropriate for discharge home back to her care so that he can start medications for his stomach issues.  No apparent emergent condition at this time and patient is appropriate for discharge home.      ____________________________________________   FINAL CLINICAL IMPRESSION(S) / ED DIAGNOSES  Final diagnoses:  Fatigue, unspecified type  Anxiety about health     ED Discharge Orders  None       Note:  This document was prepared using Dragon voice recognition software and may include unintentional dictation errors.   Chesley Noon, MD 07/26/20 1556

## 2020-07-26 NOTE — ED Triage Notes (Signed)
Pt to ED via POV with c/o "radiation exposure", pt states feels weak, extremely fatigued, light sensitivity. Per previous triage note exposure on 07/06/2020. Pt states he wants to see a medical doctor regarding radiation exposure, not doctor clapacs.   Pt states he feels like he has infection all over his skin, bone weakness, feels very dehydrated.   This RN spoke with Dr. Larinda Buttery, per Dr. Larinda Buttery no lab work.

## 2020-07-26 NOTE — ED Notes (Signed)
Patient evaluated by ED Physician Dr. Larinda Buttery, also seen by dr. Toni Amend and TTS. Patient discharged to home to follow up with GI suggestions and home care after having endoscopy today to r/o pylori. Patient verbalized understanding. Discharged to home.

## 2020-07-26 NOTE — Patient Instructions (Signed)
Mix 32 ounces of Gatorade with 238 grams of miralax. Drink 8oz every 20 minutes till solution is gone.  Do this before you start the Linzess samples.

## 2020-07-27 LAB — H. PYLORI BREATH TEST: H. pylori UBiT: NEGATIVE

## 2020-08-01 ENCOUNTER — Encounter: Payer: Self-pay | Admitting: Medical Oncology

## 2020-08-01 ENCOUNTER — Other Ambulatory Visit: Payer: Self-pay

## 2020-08-01 ENCOUNTER — Emergency Department
Admission: EM | Admit: 2020-08-01 | Discharge: 2020-08-01 | Disposition: A | Discharge status: Home / Self Care | Payer: Medicare Other | Attending: Emergency Medicine | Admitting: Emergency Medicine

## 2020-08-01 DIAGNOSIS — F418 Other specified anxiety disorders: Secondary | ICD-10-CM

## 2020-08-01 DIAGNOSIS — R5383 Other fatigue: Secondary | ICD-10-CM | POA: Insufficient documentation

## 2020-08-01 DIAGNOSIS — R208 Other disturbances of skin sensation: Secondary | ICD-10-CM | POA: Diagnosis not present

## 2020-08-01 DIAGNOSIS — R110 Nausea: Secondary | ICD-10-CM | POA: Diagnosis not present

## 2020-08-01 DIAGNOSIS — F4521 Hypochondriasis: Secondary | ICD-10-CM | POA: Insufficient documentation

## 2020-08-01 DIAGNOSIS — R5381 Other malaise: Secondary | ICD-10-CM | POA: Diagnosis present

## 2020-08-01 DIAGNOSIS — Z87891 Personal history of nicotine dependence: Secondary | ICD-10-CM | POA: Insufficient documentation

## 2020-08-01 DIAGNOSIS — F840 Autistic disorder: Secondary | ICD-10-CM | POA: Diagnosis not present

## 2020-08-01 LAB — CBC WITH DIFFERENTIAL/PLATELET
Abs Immature Granulocytes: 0.02 10*3/uL (ref 0.00–0.07)
Basophils Absolute: 0.1 10*3/uL (ref 0.0–0.1)
Basophils Relative: 1 %
Eosinophils Absolute: 0.1 10*3/uL (ref 0.0–0.5)
Eosinophils Relative: 2 %
HCT: 47.4 % (ref 39.0–52.0)
Hemoglobin: 15.9 g/dL (ref 13.0–17.0)
Immature Granulocytes: 0 %
Lymphocytes Relative: 18 %
Lymphs Abs: 1.2 10*3/uL (ref 0.7–4.0)
MCH: 29.4 pg (ref 26.0–34.0)
MCHC: 33.5 g/dL (ref 30.0–36.0)
MCV: 87.8 fL (ref 80.0–100.0)
Monocytes Absolute: 0.5 10*3/uL (ref 0.1–1.0)
Monocytes Relative: 7 %
Neutro Abs: 5.1 10*3/uL (ref 1.7–7.7)
Neutrophils Relative %: 72 %
Platelets: 276 10*3/uL (ref 150–400)
RBC: 5.4 MIL/uL (ref 4.22–5.81)
RDW: 13.3 % (ref 11.5–15.5)
WBC: 7 10*3/uL (ref 4.0–10.5)
nRBC: 0 % (ref 0.0–0.2)

## 2020-08-01 LAB — BASIC METABOLIC PANEL
Anion gap: 11 (ref 5–15)
BUN: 7 mg/dL (ref 6–20)
CO2: 25 mmol/L (ref 22–32)
Calcium: 9.6 mg/dL (ref 8.9–10.3)
Chloride: 102 mmol/L (ref 98–111)
Creatinine, Ser: 0.85 mg/dL (ref 0.61–1.24)
GFR, Estimated: >60 mL/min (ref >60)
Glucose, Bld: 110 mg/dL — ABNORMAL HIGH (ref 70–99)
Potassium: 3.6 mmol/L (ref 3.5–5.1)
Sodium: 138 mmol/L (ref 135–145)

## 2020-08-01 MED ORDER — ONDANSETRON 4 MG PO TBDP
4.0000 mg | ORAL_TABLET | Freq: Once | ORAL | Status: AC
  Administered 2020-08-01: 4 mg via ORAL
  Filled 2020-08-01: qty 1

## 2020-08-01 NOTE — ED Notes (Signed)
Pt upset about being discharged. Pt was instructed on the role of the ED and how we were to rule out life threatening issues and for him to f/u with his PCP. Pt understands. Signed discharge papers and mother called by request of pt. Mother will pick pt up to take home.

## 2020-08-01 NOTE — ED Provider Notes (Signed)
[facility] Emergency Department Provider Note   ____________________________________________   Event Date/Time   First MD Initiated Contact with Patient 08/01/20 0818     (approximate)  I have reviewed the triage vital signs and the nursing notes.   HISTORY  Chief Complaint Psychiatric Evaluation    HPI Connor [name] is a 20 y.o. male with past medical history of autistic spectrum disorder and neurofibromatosis who presents to the ED for malaise.  Patient states that he has been feeling increasingly bad over the past few weeks and is concerned he was "exposed to radiation" when he was seen in the ED in early January.  He states he had a CT scan and x-ray at that time and "now I am having all the symptoms of radiation exposure."  He reports feeling nauseous when he is exposed to light, but he denies any headache, numbness, or weakness.  He has not had any fevers, cough, chest pain, shortness of breath, vomiting, abdominal pain, or diarrhea.  He states that his "skin is burning all over" and he feels very dehydrated.  He states he has been eating and drinking normally.  He has been seen in the ED multiple times for similar complaints.  He denies any suicidal or homicidal ideation.        Past Medical History:  Diagnosis Date  . ADHD (attention deficit hyperactivity disorder)   . Autism   . Depressed   . Neurofibromatosis (HCC)    Type 1  . Obesity   . Pertussis    as a infant    Patient Active Problem List   Diagnosis Date Noted  . PTSD (post-traumatic stress disorder) 07/11/2020  . Major depressive disorder, recurrent episode (HCC) 07/11/2020  . 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 ADHD (attention deficit hyperactivity disorder) 07/08/2017  . Personality disorder (HCC) 07/08/2017  . Insomnia 06/09/2017  . Aggression 01/31/2016  . 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 Autism spectrum  disorder associated with known medical or genetic condition or environmental factor, requiring substantial support (level 2) 11/06/2015  . 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 MRI    . RADIOLOGY WITH ANESTHESIA N/A 08/13/2012   Procedure: RADIOLOGY WITH ANESTHESIA;  Surgeon: Medication Radiologist, MD;  Location: [facility] OR;  Service: Radiology;  Laterality: N/A;  MRI     Prior to Admission medications   Medication Sig Start Date End Date Taking? Authorizing Provider  carbamazepine (TEGRETOL) 200 MG tablet Take 400 mg by mouth at bedtime.    [provider]  desvenlafaxine (PRISTIQ) 100 MG 24 hr tablet Take by mouth.    [provider]  doxepin (SINEQUAN) 10 MG capsule Take 10 mg by mouth at bedtime. 02/17/20   [provider]  omeprazole (PRILOSEC) 40 MG capsule Take 1 capsule (40 mg total) by mouth 2 (two) times daily before a meal. 07/26/20 08/25/20  Vanga, Loel Dubonnet, MD  polyethylene glycol powder (GLYCOLAX/MIRALAX) 17 GM/SCOOP powder Take 255 g by mouth daily. 07/26/20   Toney Reil, MD  ziprasidone (GEODON) 40 MG capsule Take 40 mg by mouth 2 (two) times daily with a meal.    [provider]    Allergies Patient has no known allergies.  Family History  Problem Relation Age of Onset  . Anxiety disorder Mother   . Depression  Mother   . OCD Mother   . Hypertension Mother   . Cancer Maternal Aunt   . Cancer Maternal Grandmother   . Hypertension Father   . Schizophrenia Maternal Uncle     Social History Social History   Tobacco Use  . Smoking status: Former Smoker    Packs/day: 1.00    Years: 1.00    Pack years: 1.00    Types: Cigarettes    Quit date: 12/31/2014    Years since quitting: 5.5  . Smokeless tobacco: Never Used  Substance Use Topics  . Alcohol use: No    Alcohol/week: 0.0  standard drinks  . Drug use: No    Review of Systems  Constitutional: No fever/chills.  Positive for fatigue and malaise. Eyes: No visual changes. ENT: No sore throat. Cardiovascular: Denies chest pain. Respiratory: Denies shortness of breath. Gastrointestinal: No abdominal pain.  Positive for nausea, no vomiting.  No diarrhea.  No constipation. Genitourinary: Negative for dysuria. Musculoskeletal: Negative for back pain. Skin: Negative for rash. Neurological: Negative for headaches, focal weakness or numbness.  ____________________________________________   PHYSICAL EXAM:  VITAL SIGNS: ED Triage Vitals  Enc Vitals Group     BP 08/01/20 0824 (!) 144/95     Pulse Rate 08/01/20 0824 96     Resp 08/01/20 0824 20     Temp 08/01/20 0824 98.9 F (37.2 C)     Temp Source 08/01/20 0824 Oral     SpO2 08/01/20 0824 97 %     Weight 08/01/20 0825 295 lb (133.8 kg)     Height 08/01/20 0825 6\' 3"  (1.905 m)     Head Circumference --      Peak Flow --      Pain Score 08/01/20 0825 10     Pain Loc --      Pain Edu? --      Excl. in GC? --     Constitutional: Alert and oriented. Eyes: Conjunctivae are normal. Head: Atraumatic. Nose: No congestion/rhinnorhea. Mouth/Throat: Mucous membranes are moist. Neck: Normal ROM Cardiovascular: Normal rate, regular rhythm. Grossly normal heart sounds.  2+ radial pulses bilaterally. Respiratory: Normal respiratory effort.  No retractions. Lungs CTAB. Gastrointestinal: Soft and nontender. No distention. Genitourinary: deferred Musculoskeletal: No lower extremity tenderness nor edema. Neurologic:  Normal speech and language. No gross focal neurologic deficits are appreciated. Skin:  Skin is warm, dry and intact. No rash noted. Psychiatric: Mood and affect are normal. Speech and behavior are normal.  ____________________________________________   LABS (all labs ordered are listed, but only abnormal results are displayed)  Labs Reviewed   BASIC METABOLIC PANEL - Abnormal; Notable for the following components:      Result Value   Glucose, Bld 110 (*)    All other components within normal limits  CBC WITH DIFFERENTIAL/PLATELET     PROCEDURES  Procedure(s) performed (including Critical Care):  Procedures   ____________________________________________   INITIAL IMPRESSION / ASSESSMENT AND PLAN / ED COURSE       20 year old male with past medical history of autistic spectrum disorder and neurofibromatosis who presents to the ED for fatigue, malaise, nausea, and diffuse sensation of "skin burning" after "being exposed to radiation."  Patient has no signs of rash, currently does not appear dehydrated and states he has been tolerating p.o. without difficulty.  He has been seen in the ED for similar complaints on multiple occasions, is currently convinced that this is due to radiation exposure from a CT scan and x-ray.  I explained  to him that the level of radiation associated with a single CT scan and x-ray would not cause any illness, however patient remains adamant that he has radiation sickness and "needs a blood transfusion."  Patient agreeable to basic labs to ensure no anemia, electrolyte abnormality, or AKI.  Will give dose of ODT Zofran for his reported nausea.  He denies any suicidal or homicidal ideation at this time and while he does appear paranoid, there is no indication for IVC as he does not represent a threat to himself or others.  Blood work is unremarkable, patient continues to be very anxious and paranoid about "radiation exposure."  He was offered evaluation by psychiatry, but declines.  He is appropriate for discharge home with primary care follow-up.      ____________________________________________   FINAL CLINICAL IMPRESSION(S) / ED DIAGNOSES  Final diagnoses:  Malaise  Anxiety about health     ED Discharge Orders    None       Note:  This document was prepared using Dragon voice  recognition software and may include unintentional dictation errors.   Chesley Noon, MD 08/01/20 0930

## 2020-08-01 NOTE — ED Triage Notes (Signed)
Pt to ED by EMS with reports that he is "burning all over", states that he had radiation exposure when he was here a few weeks back from a CT scan and since has been having these burning sensations. Pt denies SI/HI.

## 2020-08-14 ENCOUNTER — Telehealth: Payer: Self-pay | Admitting: Gastroenterology

## 2020-08-14 ENCOUNTER — Encounter: Payer: Self-pay | Admitting: Gastroenterology

## 2020-08-14 ENCOUNTER — Other Ambulatory Visit: Payer: Self-pay

## 2020-08-14 DIAGNOSIS — R634 Abnormal weight loss: Secondary | ICD-10-CM

## 2020-08-14 DIAGNOSIS — R194 Change in bowel habit: Secondary | ICD-10-CM

## 2020-08-14 DIAGNOSIS — R1013 Epigastric pain: Secondary | ICD-10-CM

## 2020-08-14 MED ORDER — MAGNESIUM CITRATE PO SOLN: 1.0000 | Freq: Once | ORAL | 0 refills | Status: AC

## 2020-08-14 MED ORDER — NA SULFATE-K SULFATE-MG SULF 17.5-3.13-1.6 GM/177ML PO SOLN: 354.0000 mL | Freq: Once | ORAL | 0 refills | Status: AC

## 2020-08-14 NOTE — Addendum Note (Signed)
Addended by: Radene Knee L on: 08/14/2020 01:05 PM   Modules accepted: Orders

## 2020-08-14 NOTE — Telephone Encounter (Signed)
Patient is scheduled for colonoscopy and EGD on 08/29/2020. Went over instructions with patient mom. Sent instructions to mychart and prep to the pharmacy

## 2020-08-14 NOTE — Telephone Encounter (Signed)
Can not talk to Mom because a DPR form is not sign for this patient. Patient gave me a verbal okay to talk to his mom. Patient mom states he has lost 35 pounds in the last couple months. He is having trouble swallowing food. After he eats he feels like food gets stuck in lower esophagus and upper abdominal area. Mom states he will have constipation and then diarrhea and goes back and forth between both. When he takes the Miralax he has straight diarrhea. He has tried the Linzess and it is not effective. He has been taking Omeprazole 40mg  twice a day and it is not effective. Mom is crying on the phone and wants patient to have a EGD and colonoscopy.

## 2020-08-14 NOTE — Telephone Encounter (Signed)
Please go ahead and schedule upper endoscopy as well as colonoscopy, 2-day prep Epigastric pain, unintentional weight loss, altered bowel habits  RV

## 2020-08-14 NOTE — Telephone Encounter (Signed)
Patient's mom called, very upset.  Wants to speak to nurse regarding her son's treatment and what to do next for him. Please call to advise.

## 2020-08-15 ENCOUNTER — Telehealth: Payer: Self-pay | Admitting: Gastroenterology

## 2020-08-15 NOTE — Telephone Encounter (Signed)
Pharmacy called with multiple questions regarding bowel prep, please call pharmacy to advise

## 2020-08-15 NOTE — Telephone Encounter (Signed)
Called the pharmacy and they want to make sure they needed to fill the Miralax, Magnesium citrate and Suprep. Informed pharmacy yes they do

## 2020-08-20 ENCOUNTER — Other Ambulatory Visit: Payer: Self-pay

## 2020-08-20 ENCOUNTER — Emergency Department
Admission: EM | Admit: 2020-08-20 | Discharge: 2020-08-20 | Disposition: A | Discharge status: Home / Self Care | Payer: No Typology Code available for payment source | Attending: Emergency Medicine | Admitting: Emergency Medicine

## 2020-08-20 DIAGNOSIS — F22 Delusional disorders: Secondary | ICD-10-CM | POA: Insufficient documentation

## 2020-08-20 DIAGNOSIS — Z87891 Personal history of nicotine dependence: Secondary | ICD-10-CM | POA: Diagnosis not present

## 2020-08-20 DIAGNOSIS — Z79899 Other long term (current) drug therapy: Secondary | ICD-10-CM | POA: Insufficient documentation

## 2020-08-20 NOTE — Discharge Instructions (Addendum)
Please seek medical attention for any high fevers, chest pain, shortness of breath, change in behavior, persistent vomiting, bloody stool or any other new or concerning symptoms.  

## 2020-08-20 NOTE — ED Notes (Signed)
Dr. Janalyn Shy notified. As per provider, no orders provided at this time.

## 2020-08-20 NOTE — ED Notes (Signed)
Patient is concerned he has "radiation poisoning due to CT and Xray scans."

## 2020-08-20 NOTE — ED Provider Notes (Signed)
[facility] Emergency Department Provider Note   ____________________________________________   I have reviewed the triage vital signs and the nursing notes.   HISTORY  Chief Complaint Medical Clearance   History limited by: Not Limited   HPI Connor [name] is a 20 y.o. male who presents to the emergency department today because of concerns for radiation poisoning.  The patient thinks that he got radiation poisoning little over 1 month ago from a ct scan.  He thinks that he is having all kinds of symptoms from this.  The patient was seen for this 3 days ago at outside ED.  Had negative work-up at that time.  Patient states various symptoms from everything is bright when he looks to feeling like his bone marrow was not working to feeling burning.  He wants to know if there is a reversal agent additionally he wants to know if there is a way to test the radon level.  Records reviewed. Per medical record review patient has a history of ADHD, autism, depressed, neurofibromatosis.   Past Medical History:  Diagnosis Date  . ADHD (attention deficit hyperactivity disorder)   . Autism   . Depressed   . Neurofibromatosis (HCC)    Type 1  . Obesity   . Pertussis    as a infant    Patient Active Problem List   Diagnosis Date Noted  . PTSD (post-traumatic stress disorder) 07/11/2020  . Major depressive disorder, recurrent episode (HCC) 07/11/2020  . 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 ADHD (attention deficit hyperactivity disorder) 07/08/2017  . Personality disorder (HCC) 07/08/2017  . Insomnia 06/09/2017  . Aggression 01/31/2016  . 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 Autism spectrum disorder associated with known medical or genetic condition or environmental factor, requiring substantial support (level 2) 11/06/2015  . 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 MRI    . RADIOLOGY WITH ANESTHESIA N/A 08/13/2012   Procedure: RADIOLOGY WITH ANESTHESIA;  Surgeon: Medication Radiologist, MD;  Location: [facility] OR;  Service: Radiology;  Laterality: N/A;  MRI     Prior to Admission medications   Medication Sig Start Date End Date Taking? Authorizing Provider  carbamazepine (TEGRETOL) 200 MG tablet Take 400 mg by mouth at bedtime.    [provider]  desvenlafaxine (PRISTIQ) 100 MG 24 hr tablet Take by mouth.    [provider]  doxepin (SINEQUAN) 10 MG capsule Take 10 mg by mouth at bedtime. 02/17/20   [provider]  omeprazole (PRILOSEC) 40 MG capsule Take 1 capsule (40 mg total) by mouth 2 (two) times daily before a meal. 07/26/20 08/25/20  Vanga, Loel Dubonnet, MD  polyethylene glycol powder (GLYCOLAX/MIRALAX) 17 GM/SCOOP powder Take 255 g by mouth daily. 07/26/20   Toney Reil, MD  ziprasidone (GEODON) 40 MG capsule Take 40 mg by mouth 2 (two) times daily with a meal.    [provider]    Allergies Patient has no known allergies.  Family History  Problem Relation Age of Onset  . Anxiety disorder Mother   . Depression Mother   . OCD Mother   . Hypertension Mother   . Cancer Maternal Aunt   . Cancer Maternal Grandmother   . Hypertension Father   . Schizophrenia Maternal Uncle     Social History Social History   Tobacco  Use  . Smoking status: Former Smoker    Packs/day: 1.00    Years: 1.00    Pack years: 1.00    Types: Cigarettes    Quit date: 12/31/2014    Years since quitting: 5.6  . Smokeless tobacco: Never Used  Substance Use Topics  . Alcohol use: No    Alcohol/week: 0.0 standard drinks  . Drug use: No    Review of Systems Constitutional: Positive for generalized feeling bad. Eyes: Everything is bright.  ENT: Metal taste in moth.  Cardiovascular: Denies chest pain. Respiratory:  Denies shortness of breath. Gastrointestinal: Positive for burning.  Genitourinary: Negative for dysuria. Musculoskeletal: Negative for back pain. Skin: Negative for rash. Neurological: Negative for headaches, focal weakness or numbness.  ____________________________________________   PHYSICAL EXAM:  VITAL SIGNS: ED Triage Vitals  Enc Vitals Group     BP 08/20/20 1509 (!) 147/96     Pulse Rate 08/20/20 1509 95     Resp 08/20/20 1509 18     Temp 08/20/20 1509 98.5 F (36.9 C)     Temp src --      SpO2 08/20/20 1509 99 %     Weight 08/20/20 1512 290 lb (131.5 kg)     Height 08/20/20 1512 6\' 3"  (1.905 m)     Head Circumference --      Peak Flow --      Pain Score 08/20/20 1510 10   Constitutional: Alert and oriented.  Eyes: Conjunctivae are normal.  ENT      Head: Normocephalic and atraumatic.      Nose: No congestion/rhinnorhea.      Mouth/Throat: Mucous membranes are moist.      Neck: No stridor. Hematological/Lymphatic/Immunilogical: No cervical lymphadenopathy. Cardiovascular: Normal rate, regular rhythm.  No murmurs, rubs, or gallops.  Respiratory: Normal respiratory effort without tachypnea nor retractions. Breath sounds are clear and equal bilaterally. No wheezes/rales/rhonchi. Gastrointestinal: Soft and non tender. No rebound. No guarding.  Genitourinary: Deferred Musculoskeletal: Normal range of motion in all extremities. No lower extremity edema. Neurologic:  Normal speech and language. No gross focal neurologic deficits are appreciated.  Skin:  Skin is warm, dry and intact. No rash noted. Psychiatric: Delusional  ____________________________________________    LABS (pertinent positives/negatives)  None  ____________________________________________   EKG  None  ____________________________________________     RADIOLOGY  None  ____________________________________________   PROCEDURES  Procedures  ____________________________________________   INITIAL IMPRESSION / ASSESSMENT AND PLAN / ED COURSE  Pertinent labs & imaging results that were available during my care of the patient were reviewed by me and considered in my medical decision making (see chart for details).   Patient presented to the emergency department today because of concerns for possible radiation illness.  He did have work-up for this 3 days ago at outside hospital without concerning findings.  I tried to reassure the patient that he does not have radiation sickness.  Additionally I did try to educate the patient since he seems to have some misunderstanding since he also mentioned getting his radon levels tested.  Discussed with patient importance of following up with primary care.  ____________________________________________   FINAL CLINICAL IMPRESSION(S) / ED DIAGNOSES  Final diagnoses:  Delusion [facility])     Note: This dictation was prepared with Dragon dictation. Any transcriptional errors that result from this process are unintentional     [facility], MD 08/20/20 1601

## 2020-08-20 NOTE — ED Triage Notes (Addendum)
Pt states coming in due to being "exposed to ionizing radiation here, due to x-ray and CT scans." Pt states this was 40 days ago and since then he has had problems.  Pt states coming in multiple times for the same complaint. Pt states "I have the taste of metal in my mouth, and I have these electromagnetic shocks." Pt states he is having memory loss too.  Pt is calm and cooperative in triage.

## 2020-08-20 NOTE — ED Notes (Signed)
Sig pad not available, pt verbalizes understanding of d/c instructions, denies questions or concerns. Steady gait, NAD noted

## 2020-08-22 ENCOUNTER — Other Ambulatory Visit: Payer: Self-pay

## 2020-08-22 ENCOUNTER — Emergency Department
Admission: EM | Admit: 2020-08-22 | Discharge: 2020-08-22 | Disposition: A | Discharge status: Home / Self Care | Payer: No Typology Code available for payment source | Attending: Emergency Medicine | Admitting: Emergency Medicine

## 2020-08-22 ENCOUNTER — Encounter: Payer: Self-pay | Admitting: Emergency Medicine

## 2020-08-22 DIAGNOSIS — Z7189 Other specified counseling: Secondary | ICD-10-CM | POA: Diagnosis present

## 2020-08-22 DIAGNOSIS — Z87891 Personal history of nicotine dependence: Secondary | ICD-10-CM | POA: Diagnosis not present

## 2020-08-22 DIAGNOSIS — F22 Delusional disorders: Secondary | ICD-10-CM | POA: Diagnosis not present

## 2020-08-22 DIAGNOSIS — F840 Autistic disorder: Secondary | ICD-10-CM | POA: Insufficient documentation

## 2020-08-22 DIAGNOSIS — F418 Other specified anxiety disorders: Secondary | ICD-10-CM

## 2020-08-22 DIAGNOSIS — F909 Attention-deficit hyperactivity disorder, unspecified type: Secondary | ICD-10-CM | POA: Insufficient documentation

## 2020-08-22 DIAGNOSIS — F419 Anxiety disorder, unspecified: Secondary | ICD-10-CM | POA: Insufficient documentation

## 2020-08-22 DIAGNOSIS — Z79899 Other long term (current) drug therapy: Secondary | ICD-10-CM | POA: Diagnosis not present

## 2020-08-22 DIAGNOSIS — F4310 Post-traumatic stress disorder, unspecified: Secondary | ICD-10-CM | POA: Insufficient documentation

## 2020-08-22 DIAGNOSIS — F4325 Adjustment disorder with mixed disturbance of emotions and conduct: Secondary | ICD-10-CM | POA: Diagnosis not present

## 2020-08-22 DIAGNOSIS — F329 Major depressive disorder, single episode, unspecified: Secondary | ICD-10-CM | POA: Diagnosis not present

## 2020-08-22 MED ORDER — LORAZEPAM 1 MG PO TABS: 1.0000 mg | ORAL_TABLET | Freq: Every day | ORAL | 0 refills | Status: DC

## 2020-08-22 MED ORDER — LORAZEPAM 2 MG/ML IJ SOLN
2.0000 mg | Freq: Once | INTRAMUSCULAR | Status: AC
  Administered 2020-08-22: 2 mg via INTRAMUSCULAR
  Filled 2020-08-22: qty 1

## 2020-08-22 NOTE — ED Notes (Signed)
Pt c/o diarrhea and constipation with burning from the inside out, pt has been here, [facility] and [facility] per mother concerned he has radiation poisoning, states he is so focused that almost everything has been unplugged in the house, he keep the heat turned off. Mother is asking if there is anyway to do a toxic screening for radiation to help resolve his focus on being poisoned, states he has an appt with GI next week for endo and coloscopy.

## 2020-08-22 NOTE — ED Provider Notes (Signed)
[facility] Emergency Department Provider Note   ____________________________________________   Event Date/Time   First MD Initiated Contact with Patient 08/22/20 1203     (approximate)  I have reviewed the triage vital signs and the nursing notes.   HISTORY  Chief Complaint No chief complaint on file.    HPI Connor [name] is a 20 y.o. male patient here today for continued complaint radiation portion. Mother state she is aware that the patient has delusions. Patient states complaint began as having a CT scan of the abdomen. Patient said no one convinced him that he is not experiencing radiation portion from the literature he is reading. Patient is scheduled for colonoscopy on August 29, 2020. Patient is convinced this will prove that he has had radiation portion.         Past Medical History:  Diagnosis Date  . ADHD (attention deficit hyperactivity disorder)   . Autism   . Depressed   . Neurofibromatosis (HCC)    Type 1  . Obesity   . Pertussis    as a infant    Patient Active Problem List   Diagnosis Date Noted  . PTSD (post-traumatic stress disorder) 07/11/2020  . Major depressive disorder, recurrent episode (HCC) 07/11/2020  . 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 ADHD (attention deficit hyperactivity disorder) 07/08/2017  . Personality disorder (HCC) 07/08/2017  . Insomnia 06/09/2017  . Aggression 01/31/2016  . 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 Autism spectrum disorder associated with known medical or genetic condition or environmental factor, requiring substantial support (level 2) 11/06/2015  . 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 MRI    . RADIOLOGY WITH ANESTHESIA N/A 08/13/2012   Procedure: RADIOLOGY WITH ANESTHESIA;   Surgeon: Medication Radiologist, MD;  Location: [facility] OR;  Service: Radiology;  Laterality: N/A;  MRI     Prior to Admission medications   Medication Sig Start Date End Date Taking? Authorizing Provider  LORazepam (ATIVAN) 1 MG tablet Take 1 tablet (1 mg total) by mouth at bedtime. 08/22/20 08/22/21 Yes Joni Reining, PA-C  carbamazepine (TEGRETOL) 200 MG tablet Take 400 mg by mouth at bedtime.    [provider]  desvenlafaxine (PRISTIQ) 100 MG 24 hr tablet Take by mouth.    [provider]  doxepin (SINEQUAN) 10 MG capsule Take 10 mg by mouth at bedtime. 02/17/20   [provider]  omeprazole (PRILOSEC) 40 MG capsule Take 1 capsule (40 mg total) by mouth 2 (two) times daily before a meal. 07/26/20 08/25/20  Vanga, Loel Dubonnet, MD  polyethylene glycol powder (GLYCOLAX/MIRALAX) 17 GM/SCOOP powder Take 255 g by mouth daily. 07/26/20   Toney Reil, MD  ziprasidone (GEODON) 40 MG capsule Take 40 mg by mouth 2 (two) times daily with a meal.    [provider]    Allergies Patient has no known allergies.  Family History  Problem Relation Age of Onset  . Anxiety disorder Mother   . Depression Mother   . OCD Mother   . Hypertension Mother   . Cancer Maternal Aunt   . Cancer Maternal Grandmother   . Hypertension Father   . Schizophrenia Maternal Uncle     Social History Social History   Tobacco Use  . Smoking status: Former Smoker    Packs/day: 1.00  Years: 1.00    Pack years: 1.00    Types: Cigarettes    Quit date: 12/31/2014    Years since quitting: 5.6  . Smokeless tobacco: Never Used  Substance Use Topics  . Alcohol use: No    Alcohol/week: 0.0 standard drinks  . Drug use: No    Review of Systems  Constitutional: No fever/chills Eyes: No visual changes. ENT: No sore throat. Cardiovascular: Denies chest pain. Respiratory: Denies shortness of breath. Gastrointestinal: No abdominal pain.  No nausea, no vomiting.  No diarrhea.  No  constipation. Genitourinary: Negative for dysuria. Musculoskeletal: Negative for back pain. Skin: Negative for rash. Neurological: Negative for headaches, focal weakness or numbness. Psychiatric:  ADHD, aggressive behavior. Anxiety, autism, and delusions, insomnia, and PTSD.  ____________________________________________   PHYSICAL EXAM:  VITAL SIGNS: ED Triage Vitals  Enc Vitals Group     BP 08/22/20 1135 135/90     Pulse Rate 08/22/20 1135 98     Resp 08/22/20 1135 18     Temp 08/22/20 1135 98.7 F (37.1 C)     Temp Source 08/22/20 1135 Oral     SpO2 08/22/20 1135 98 %     Weight 08/22/20 1136 288 lb 12.8 oz (131 kg)     Height --      Head Circumference --      Peak Flow --      Pain Score --      Pain Loc --      Pain Edu? --      Excl. in GC? --     Constitutional: Alert and oriented. Anxious. Cardiovascular: Normal rate, regular rhythm. Grossly normal heart sounds.  Good peripheral circulation. Respiratory: Normal respiratory effort.  No retractions. Lungs CTAB. Gastrointestinal: Soft and nontender. No distention. No abdominal bruits. No CVA tenderness. Genitourinary: Deferred Musculoskeletal: No lower extremity tenderness nor edema.  No joint effusions. Neurologic:  Normal speech and language. No gross focal neurologic deficits are appreciated. No gait instability. Skin:  Skin is warm, dry and intact. No rash noted. Psychiatric: Mood and affect are normal. Speech and behavior are normal.  ____________________________________________   LABS (all labs ordered are listed, but only abnormal results are displayed)  Labs Reviewed - No data to display ____________________________________________  EKG   ____________________________________________  RADIOLOGY I, Joni Reining, personally viewed and evaluated these images (plain radiographs) as part of my medical decision making, as well as reviewing the written report by the radiologist.  ED MD interpretation:     Official radiology report(s): No results found.  ____________________________________________   PROCEDURES  Procedure(s) performed (including Critical Care):  Procedures   ____________________________________________   INITIAL IMPRESSION / ASSESSMENT AND PLAN / ED COURSE  As part of my medical decision making, I reviewed the following data within the electronic MEDICAL RECORD NUMBER         Patient presents with complaint of radiation poisoning. This has been ongoing complaint for 2 months. Patient refractory to  of psychiatric counseling. Patient appears in anxiety state. Mother will consider involuntary commitment after patient has coloscopic exam next week. Patient given a prescription for Ativan 1 mg at bedtime for 10 days.      ____________________________________________   FINAL CLINICAL IMPRESSION(S) / ED DIAGNOSES  Final diagnoses:  Anxiety about health     ED Discharge Orders         Ordered    LORazepam (ATIVAN) 1 MG tablet  Daily at bedtime        08/22/20 1250          *  Please note:  Connor [name] was evaluated in Emergency Department on 08/22/2020 for the symptoms described in the history of present illness. He was evaluated in the context of the global COVID-19 pandemic, which necessitated consideration that the patient might be at risk for infection with the SARS-CoV-2 virus that causes COVID-19. Institutional protocols and algorithms that pertain to the evaluation of patients at risk for COVID-19 are in a state of rapid change based on information released by regulatory bodies including the CDC and federal and state organizations. These policies and algorithms were followed during the patient's care in the ED.  Some ED evaluations and interventions may be delayed as a result of limited staffing during and the pandemic.*   Note:  This document was prepared using Dragon voice recognition software and may include unintentional dictation  errors.    Joni Reining, PA-C 08/22/20 1300    Merwyn Katos, MD 08/22/20 [phone]

## 2020-08-22 NOTE — Discharge Instructions (Addendum)
Advised to follow-up with [facility] or [facility] for definitive evaluation and treatment.

## 2020-08-22 NOTE — ED Triage Notes (Signed)
Pt brought over from [facility] with c/o abdominal pain. Pt has been seen in ED and evaluated several times for similar issues.

## 2020-08-25 ENCOUNTER — Emergency Department
Admission: EM | Admit: 2020-08-25 | Discharge: 2020-08-28 | Disposition: A | Discharge status: Home / Self Care | Payer: Medicare Other | Attending: Emergency Medicine | Admitting: Emergency Medicine

## 2020-08-25 ENCOUNTER — Encounter: Payer: Self-pay | Admitting: Emergency Medicine

## 2020-08-25 ENCOUNTER — Other Ambulatory Visit
Admission: RE | Admit: 2020-08-25 | Discharge: 2020-08-25 | Disposition: A | Discharge status: Home / Self Care | Payer: Medicare Other | Source: Ambulatory Visit | Attending: Gastroenterology | Admitting: Gastroenterology

## 2020-08-25 ENCOUNTER — Other Ambulatory Visit: Payer: Self-pay

## 2020-08-25 DIAGNOSIS — F22 Delusional disorders: Secondary | ICD-10-CM | POA: Diagnosis present

## 2020-08-25 DIAGNOSIS — Z01812 Encounter for preprocedural laboratory examination: Secondary | ICD-10-CM | POA: Insufficient documentation

## 2020-08-25 DIAGNOSIS — F840 Autistic disorder: Secondary | ICD-10-CM | POA: Insufficient documentation

## 2020-08-25 DIAGNOSIS — Z20822 Contact with and (suspected) exposure to covid-19: Secondary | ICD-10-CM | POA: Diagnosis not present

## 2020-08-25 DIAGNOSIS — F909 Attention-deficit hyperactivity disorder, unspecified type: Secondary | ICD-10-CM | POA: Insufficient documentation

## 2020-08-25 DIAGNOSIS — Z79899 Other long term (current) drug therapy: Secondary | ICD-10-CM | POA: Insufficient documentation

## 2020-08-25 DIAGNOSIS — F410 Panic disorder [episodic paroxysmal anxiety] without agoraphobia: Secondary | ICD-10-CM | POA: Diagnosis not present

## 2020-08-25 DIAGNOSIS — F451 Undifferentiated somatoform disorder: Secondary | ICD-10-CM

## 2020-08-25 DIAGNOSIS — F4325 Adjustment disorder with mixed disturbance of emotions and conduct: Secondary | ICD-10-CM | POA: Diagnosis not present

## 2020-08-25 DIAGNOSIS — Z87891 Personal history of nicotine dependence: Secondary | ICD-10-CM | POA: Insufficient documentation

## 2020-08-25 DIAGNOSIS — R208 Other disturbances of skin sensation: Secondary | ICD-10-CM | POA: Diagnosis not present

## 2020-08-25 LAB — URINE DRUG SCREEN, QUALITATIVE (ARMC ONLY)
Amphetamines, Ur Screen: NOT DETECTED
Barbiturates, Ur Screen: NOT DETECTED
Benzodiazepine, Ur Scrn: POSITIVE — AB
Cannabinoid 50 Ng, Ur ~~LOC~~: NOT DETECTED
Cocaine Metabolite,Ur ~~LOC~~: NOT DETECTED
MDMA (Ecstasy)Ur Screen: NOT DETECTED
Methadone Scn, Ur: NOT DETECTED
Opiate, Ur Screen: NOT DETECTED
Phencyclidine (PCP) Ur S: NOT DETECTED
Tricyclic, Ur Screen: NOT DETECTED

## 2020-08-25 LAB — CBC
HCT: 45 % (ref 39.0–52.0)
Hemoglobin: 14.9 g/dL (ref 13.0–17.0)
MCH: 29.2 pg (ref 26.0–34.0)
MCHC: 33.1 g/dL (ref 30.0–36.0)
MCV: 88.1 fL (ref 80.0–100.0)
Platelets: 282 10*3/uL (ref 150–400)
RBC: 5.11 MIL/uL (ref 4.22–5.81)
RDW: 12.8 % (ref 11.5–15.5)
WBC: 4.3 10*3/uL (ref 4.0–10.5)
nRBC: 0 % (ref 0.0–0.2)

## 2020-08-25 LAB — COMPREHENSIVE METABOLIC PANEL
ALT: 16 U/L (ref 0–44)
AST: 14 U/L — ABNORMAL LOW (ref 15–41)
Albumin: 4.6 g/dL (ref 3.5–5.0)
Alkaline Phosphatase: 58 U/L (ref 38–126)
Anion gap: 12 (ref 5–15)
BUN: 11 mg/dL (ref 6–20)
CO2: 24 mmol/L (ref 22–32)
Calcium: 8.9 mg/dL (ref 8.9–10.3)
Chloride: 100 mmol/L (ref 98–111)
Creatinine, Ser: 0.86 mg/dL (ref 0.61–1.24)
GFR, Estimated: >60 mL/min (ref >60)
Glucose, Bld: 76 mg/dL (ref 70–99)
Potassium: 3.9 mmol/L (ref 3.5–5.1)
Sodium: 136 mmol/L (ref 135–145)
Total Bilirubin: 1.1 mg/dL (ref 0.3–1.2)
Total Protein: 7.1 g/dL (ref 6.5–8.1)

## 2020-08-25 LAB — URINALYSIS, COMPLETE (UACMP) WITH MICROSCOPIC
Bacteria, UA: NONE SEEN
Bilirubin Urine: NEGATIVE
Glucose, UA: NEGATIVE mg/dL
Hgb urine dipstick: NEGATIVE
Ketones, ur: 80 mg/dL — AB
Leukocytes,Ua: NEGATIVE
Nitrite: NEGATIVE
Protein, ur: 30 mg/dL — AB
Specific Gravity, Urine: 1.029 (ref 1.005–1.030)
pH: 5 (ref 5.0–8.0)

## 2020-08-25 LAB — SARS CORONAVIRUS 2 (TAT 6-24 HRS): SARS Coronavirus 2: NEGATIVE

## 2020-08-25 LAB — RESP PANEL BY RT-PCR (FLU A&B, COVID) ARPGX2
Influenza A by PCR: NEGATIVE
Influenza B by PCR: NEGATIVE
SARS Coronavirus 2 by RT PCR: NEGATIVE

## 2020-08-25 MED ORDER — ALPRAZOLAM 0.5 MG PO TABS
0.5000 mg | ORAL_TABLET | Freq: Once | ORAL | Status: AC
  Administered 2020-08-27: 0.5 mg via ORAL
  Filled 2020-08-25 (×2): qty 1

## 2020-08-25 MED ORDER — HYDROXYZINE HCL 25 MG PO TABS
50.0000 mg | ORAL_TABLET | Freq: Once | ORAL | Status: AC
  Administered 2020-08-25: 50 mg via ORAL

## 2020-08-25 NOTE — ED Notes (Signed)
Pt belongings include:  1 pink shirt 1 grey underwear 1 grey pants 2 black shoes  2 white socks

## 2020-08-25 NOTE — ED Notes (Signed)
Pt telling this Clinical research associate he is suffering from memory loss, confusion, bowel necrosis, and cerebral edema.  Pt still refuses to take ordered medication to help his anxiety.

## 2020-08-25 NOTE — ED Triage Notes (Signed)
C/O burning to arms, legs, eyes, tendons since radiation exposure 47 days ago.  Patient is AAOx3.  Denies SI/ HI.  NAD

## 2020-08-25 NOTE — ED Notes (Signed)
Pt given dinner tray and drink at this time. 

## 2020-08-25 NOTE — ED Notes (Signed)
IV[facility]  CONSULT  DONE  PENDING  PLACEMENT 

## 2020-08-25 NOTE — ED Notes (Signed)
Hourly rounding completed at this time, patient currently awake in room. No complaints, stable, and in no acute distress. Q15 minute rounds and monitoring via Security Cameras to continue. 

## 2020-08-25 NOTE — ED Notes (Signed)
Pt tells this writer none of his problem are in his head. Pt states his intestines is dead, he has a cerebral hemorraghic, mucosal tears and his bones are burning.  Pt refused PO mediation for anxiety.

## 2020-08-25 NOTE — ED Notes (Signed)
IVC moved to [facility] 1

## 2020-08-25 NOTE — ED Notes (Signed)
Report received from Amy, RN including  Situation, Background, Assessment, and Recommendations. Patient alert and oriented, warm and dry, in no acute distress. Patient denies SI, HI, AVH and pain. Patient made aware of Q15 minute rounds and security cameras for their safety. Patient instructed to come to this nurse with needs or concerns. 

## 2020-08-25 NOTE — ED Provider Notes (Signed)
[facility] Emergency Department Provider Note   ____________________________________________    I have reviewed the triage vital signs and the nursing notes.   HISTORY  Chief Complaint Concerns about radiation poisoning    HPI Connor [name] is a 20 y.o. male with a history as noted below who presents with concern about radiation poisoning.  Patient has been seen many times in the emergency department for similar complaints.  He states that he is concerned about the toxins in his blood and would like toxin tests performed.  Reviewed medical records, patient has had 13 ED visits over the last 6 months for similar complaints  Past Medical History:  Diagnosis Date  . ADHD (attention deficit hyperactivity disorder)   . Autism   . Depressed   . Neurofibromatosis (HCC)    Type 1  . Obesity   . Pertussis    as a infant    Patient Active Problem List   Diagnosis Date Noted  . PTSD (post-traumatic stress disorder) 07/11/2020  . Major depressive disorder, recurrent episode (HCC) 07/11/2020  . 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 ADHD (attention deficit hyperactivity disorder) 07/08/2017  . Personality disorder (HCC) 07/08/2017  . Insomnia 06/09/2017  . Aggression 01/31/2016  . 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 Autism spectrum disorder associated with known medical or genetic condition or environmental factor, requiring substantial support (level 2) 11/06/2015  . 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 MRI    . RADIOLOGY WITH ANESTHESIA N/A 08/13/2012   Procedure: RADIOLOGY WITH ANESTHESIA;  Surgeon: Medication Radiologist, MD;  Location: [facility] OR;  Service: Radiology;  Laterality: N/A;  MRI     Prior to Admission medications   Medication  Sig Start Date End Date Taking? Authorizing Provider  carbamazepine (TEGRETOL) 200 MG tablet Take 400 mg by mouth at bedtime.    [provider]  desvenlafaxine (PRISTIQ) 100 MG 24 hr tablet Take by mouth.    [provider]  doxepin (SINEQUAN) 10 MG capsule Take 10 mg by mouth at bedtime. 02/17/20   [provider]  LORazepam (ATIVAN) 1 MG tablet Take 1 tablet (1 mg total) by mouth at bedtime. 08/22/20 08/22/21  Joni Reining, PA-C  omeprazole (PRILOSEC) 40 MG capsule Take 1 capsule (40 mg total) by mouth 2 (two) times daily before a meal. 07/26/20 08/25/20  Vanga, Loel Dubonnet, MD  polyethylene glycol powder (GLYCOLAX/MIRALAX) 17 GM/SCOOP powder Take 255 g by mouth daily. 07/26/20   Toney Reil, MD  ziprasidone (GEODON) 40 MG capsule Take 40 mg by mouth 2 (two) times daily with a meal.    [provider]     Allergies Patient has no known allergies.  Family History  Problem Relation Age of Onset  . Anxiety disorder Mother   . Depression Mother   . OCD Mother   . Hypertension Mother   . Cancer Maternal Aunt   . Cancer Maternal Grandmother   . Hypertension Father   . Schizophrenia Maternal Uncle     Social History Social History   Tobacco Use  . Smoking status: Former Smoker    Packs/day: 1.00    Years: 1.00    Pack years: 1.00    Types: Cigarettes    Quit date: 12/31/2014    Years since quitting: 5.6  . Smokeless  tobacco: Never Used  Substance Use Topics  . Alcohol use: No    Alcohol/week: 0.0 standard drinks  . Drug use: No    Review of Systems  Constitutional: No fever/chills Eyes: No visual changes.  ENT: No sore throat. Cardiovascular: Denies chest pain. Respiratory: Denies shortness of breath. Gastrointestinal: No abdominal pain.   Genitourinary: Negative for dysuria. Musculoskeletal: Feels achy Skin: Negative for rash. Neurological: Negative for headaches or  weakness   ____________________________________________   PHYSICAL EXAM:  VITAL SIGNS: ED Triage Vitals  Enc Vitals Group     BP 08/25/20 1022 (!) 130/91     Pulse Rate 08/25/20 1022 88     Resp 08/25/20 1022 19     Temp 08/25/20 1022 98.5 F (36.9 C)     Temp Source 08/25/20 1022 Oral     SpO2 08/25/20 1022 98 %     Weight 08/25/20 1022 126 kg (277 lb 12.5 oz)     Height --      Head Circumference --      Peak Flow --      Pain Score 08/25/20 1018 8     Pain Loc --      Pain Edu? --      Excl. in GC? --     Constitutional: Alert and oriented. No acute distress.  Eyes: Conjunctivae are normal.   Nose: No congestion/rhinnorhea. Mouth/Throat: Mucous membranes are moist.    Cardiovascular: Normal rate, regular rhythm. Grossly normal heart sounds.  Good peripheral circulation. Respiratory: Normal respiratory effort.  No retractions. Lungs CTAB. Gastrointestinal: Soft and nontender. No distention.  No CVA tenderness.  Musculoskeletal: No lower extremity tenderness nor edema.  Warm and well perfused Neurologic:  Normal speech and language. No gross focal neurologic deficits are appreciated.  Skin:  Skin is warm, dry and intact. No rash noted. Psychiatric: Patient with persistent delusion regarding radiation poisoning however calm without evidence of significant psychosis  ____________________________________________   LABS (all labs ordered are listed, but only abnormal results are displayed)  Labs Reviewed  COMPREHENSIVE METABOLIC PANEL - Abnormal; Notable for the following components:      Result Value   AST 14 (*)    All other components within normal limits  URINALYSIS, COMPLETE (UACMP) WITH MICROSCOPIC - Abnormal; Notable for the following components:   Color, Urine YELLOW (*)    APPearance CLEAR (*)    Ketones, ur 80 (*)    Protein, ur 30 (*)    All other components within normal limits  URINE DRUG SCREEN, QUALITATIVE ([facility] ONLY) - Abnormal; Notable for the  following components:   Benzodiazepine, Ur Scrn POSITIVE (*)    All other components within normal limits  CBC   ____________________________________________  EKG None____________________________________________  RADIOLOGY  None ____________________________________________   PROCEDURES  Procedure(s) performed: No  Procedures   Critical Care performed: No ____________________________________________   INITIAL IMPRESSION / ASSESSMENT AND PLAN / ED COURSE  Pertinent labs & imaging results that were available during my care of the patient were reviewed by me and considered in my medical decision making (see chart for details).  Patient is medically cleared for psychiatric evaluation.Lab work reviewed and is reassuring  Spoke with patient's mother with his permission, she reports she has had to hide all the sharp objects in the house because he has made statements that he wants her to kill her or that he wants to die because of his longstanding delusion of radiation poisoning.  Patient seen by Dr. Toni Amend who plans  to admit the patient, I have placed the patient under involuntary commitment   The patient has been placed in psychiatric observation due to the need to provide a safe environment for the patient while obtaining psychiatric consultation and evaluation, as well as ongoing medical and medication management to treat the patient's condition.  The patient has been placed under full IVC at this time.       ____________________________________________   FINAL CLINICAL IMPRESSION(S) / ED DIAGNOSES  Final diagnoses:  Paranoia (HCC)        Note:  This document was prepared using Dragon voice recognition software and may include unintentional dictation errors.   Jene Every, MD 08/25/20 1209

## 2020-08-25 NOTE — ED Notes (Signed)
Pt to desk and complains of same symptoms from his radiation exposure 47 days ago. States he has to have something now because it is a medical emergency. Dr. Scotty Court notified.

## 2020-08-25 NOTE — Discharge Instructions (Addendum)

## 2020-08-25 NOTE — Consult Note (Signed)
[facility]   Reason for Consult: Consult for 20 year old man with a history of some degree of developmental disability possible autistic spectrum disorder who has developed profound anxiety complicated by suicidal ideation Referring Physician: Cyril LoosenKinner Patient Identification: Connor [name] MRN:  [id number] Principal Diagnosis: Panic disorder Diagnosis:  Principal Problem:   Panic disorder Active Problems:   Somatic symptom disorder   Delusional disorder (HCC)   Total Time spent with patient: 1 hour  Subjective:   Connor [name] is a 20 y.o. male patient admitted with "you do not understand how bad it is".  HPI: This is a 20 year old man who previously is reported to have had a diagnosis of possible autistic spectrum disorder who in the last month has developed profound anxiety to the degree of being delusional.  On July 07, 2020 he had a CT scan performed which was being done to work-up some undiagnosed abdominal complaints.  After having the CT scan done he has developed the belief that he is suffering from profound side effects from the CT.  He claims that his entire body feels like it is burning all over, that everything he sees and every light he is around is extraordinarily bright and that he is having ongoing chest pain and difficulty breathing.  He has been to the emergency room a remarkably large number of times since then and has been reassured repeatedly that there is nothing physiologically wrong with him and that it is impossible for a CT scan to have caused the sort of complaints.  None of this has helped him to relax at all.  It has escalated to where he comes in talking about wanting to die if he does not feel better.  Mother reports that his behavior has become more and more aggressive hostile angry and somewhat threatening at home.  The problem seems to be getting worse in the past month and a half rather than better.  Patient  has been seen by psychiatry in the emergency room more than once for this condition but has steadfastly refused to cooperate because of his insistence that he is having a physical problem not a mental health 1  Past Psychiatric History: Past history of possible autistic spectrum disorder ADHD neurofibromatosis  Risk to Self:   Risk to Others:   Prior Inpatient Therapy:   Prior Outpatient Therapy:    Past Medical History:  Past Medical History:  Diagnosis Date  . ADHD (attention deficit hyperactivity disorder)   . Autism   . Depressed   . Neurofibromatosis (HCC)    Type 1  . Obesity   . Pertussis    as a infant    Past Surgical History:  Procedure Laterality Date  . MRI    . RADIOLOGY WITH ANESTHESIA N/A 08/13/2012   Procedure: RADIOLOGY WITH ANESTHESIA;  Surgeon: Medication Radiologist, MD;  Location: [facility] OR;  Service: Radiology;  Laterality: N/A;  MRI    Family History:  Family History  Problem Relation Age of Onset  . Anxiety disorder Mother   . Depression Mother   . OCD Mother   . Hypertension Mother   . Cancer Maternal Aunt   . Cancer Maternal Grandmother   . Hypertension Father   . Schizophrenia Maternal Uncle    Family Psychiatric  History: Schizophrenia and an uncle anxiety disorders and OCD and close relatives Social History:  Social History   Substance and Sexual Activity  Alcohol Use No  . Alcohol/week: 0.0 standard drinks  Social History   Substance and Sexual Activity  Drug Use No    Social History   Socioeconomic History  . Marital status: Single    Spouse name: Not on file  . Number of children: Not on file  . Years of education: Not on file  . Highest education level: Not on file  Occupational History  . Not on file  Tobacco Use  . Smoking status: Former Smoker    Packs/day: 1.00    Years: 1.00    Pack years: 1.00    Types: Cigarettes    Quit date: 12/31/2014    Years since quitting: 5.6  . Smokeless tobacco: Never Used  Substance  and Sexual Activity  . Alcohol use: No    Alcohol/week: 0.0 standard drinks  . Drug use: No  . Sexual activity: Not Currently    Birth control/protection: None  Other Topics Concern  . Not on file  Social History Narrative   Osmany is a high school drop out.   He lives with his mom only. He has one sister.   He enjoys eating, sleeping, and watching tv.   Social Determinants of Health   Financial Resource Strain: Not on file  Food Insecurity: Not on file  Transportation Needs: Not on file  Physical Activity: Not on file  Stress: Not on file  Social Connections: Not on file   Additional Social History:    Allergies:  No Known Allergies  Labs:  Results for orders placed or performed during the hospital encounter of 08/25/20 (from the past 48 hour(s))  CBC     Status: None   Collection Time: 08/25/20 10:42 AM  Result Value Ref Range   WBC 4.3 4.0 - 10.5 K/uL   RBC 5.11 4.22 - 5.81 MIL/uL   Hemoglobin 14.9 13.0 - 17.0 g/dL   HCT 62.6 94.8 - 54.6 %   MCV 88.1 80.0 - 100.0 fL   MCH 29.2 26.0 - 34.0 pg   MCHC 33.1 30.0 - 36.0 g/dL   RDW 27.0 35.0 - 09.3 %   Platelets 282 150 - 400 K/uL   nRBC 0.0 0.0 - 0.2 %    Comment: Performed at [facility], [address], [place], [place] [address]  Comprehensive metabolic panel     Status: Abnormal   Collection Time: 08/25/20 10:42 AM  Result Value Ref Range   Sodium 136 135 - 145 mmol/L   Potassium 3.9 3.5 - 5.1 mmol/L   Chloride 100 98 - 111 mmol/L   CO2 24 22 - 32 mmol/L   Glucose, Bld 76 70 - 99 mg/dL    Comment: Glucose reference range applies only to samples taken after fasting for at least 8 hours.   BUN 11 6 - 20 mg/dL   Creatinine, Ser 9.37 0.61 - 1.24 mg/dL   Calcium 8.9 8.9 - 16.9 mg/dL   Total Protein 7.1 6.5 - 8.1 g/dL   Albumin 4.6 3.5 - 5.0 g/dL   AST 14 (L) 15 - 41 U/L   ALT 16 0 - 44 U/L   Alkaline Phosphatase 58 38 - 126 U/L   Total Bilirubin 1.1 0.3 - 1.2 mg/dL   GFR, Estimated >67 >89 mL/min     Comment: (NOTE) Calculated using the CKD-EPI Creatinine Equation (2021)    Anion gap 12 5 - 15    Comment: Performed at [facility], [address], [place], [place] [address]  Urinalysis, Complete w Microscopic     Status: Abnormal  Collection Time: 08/25/20 10:54 AM  Result Value Ref Range   Color, Urine YELLOW (A) YELLOW   APPearance CLEAR (A) CLEAR   Specific Gravity, Urine 1.029 1.005 - 1.030   pH 5.0 5.0 - 8.0   Glucose, UA NEGATIVE NEGATIVE mg/dL   Hgb urine dipstick NEGATIVE NEGATIVE   Bilirubin Urine NEGATIVE NEGATIVE   Ketones, ur 80 (A) NEGATIVE mg/dL   Protein, ur 30 (A) NEGATIVE mg/dL   Nitrite NEGATIVE NEGATIVE   Leukocytes,Ua NEGATIVE NEGATIVE   WBC, UA 0-5 0 - 5 WBC/hpf   Bacteria, UA NONE SEEN NONE SEEN   Squamous Epithelial / LPF 0-5 0 - 5   Mucus PRESENT     Comment: Performed at [facility], [address], [place], [place] [address]  Urine Drug Screen, Qualitative ([facility] only)     Status: Abnormal   Collection Time: 08/25/20 10:54 AM  Result Value Ref Range   Tricyclic, Ur Screen NONE DETECTED NONE DETECTED   Amphetamines, Ur Screen NONE DETECTED NONE DETECTED   MDMA (Ecstasy)Ur Screen NONE DETECTED NONE DETECTED   Cocaine Metabolite,Ur [place] NONE DETECTED NONE DETECTED   Opiate, Ur Screen NONE DETECTED NONE DETECTED   Phencyclidine (PCP) Ur S NONE DETECTED NONE DETECTED   Cannabinoid 50 Ng, Ur [place] NONE DETECTED NONE DETECTED   Barbiturates, Ur Screen NONE DETECTED NONE DETECTED   Benzodiazepine, Ur Scrn POSITIVE (A) NONE DETECTED   Methadone Scn, Ur NONE DETECTED NONE DETECTED    Comment: (NOTE) Tricyclics + metabolites, urine    Cutoff 1000 ng/mL Amphetamines + metabolites, urine  Cutoff 1000 ng/mL MDMA (Ecstasy), urine              Cutoff 500 ng/mL Cocaine Metabolite, urine          Cutoff 300 ng/mL Opiate + metabolites, urine        Cutoff 300 ng/mL Phencyclidine (PCP), urine         Cutoff 25 ng/mL Cannabinoid, urine                  Cutoff 50 ng/mL Barbiturates + metabolites, urine  Cutoff 200 ng/mL Benzodiazepine, urine              Cutoff 200 ng/mL Methadone, urine                   Cutoff 300 ng/mL  The urine drug screen provides only a preliminary, unconfirmed analytical test result and should not be used for non-medical purposes. Clinical consideration and professional judgment should be applied to any positive drug screen result due to possible interfering substances. A more specific alternate chemical method must be used in order to obtain a confirmed analytical result. Gas chromatography / mass spectrometry (GC/MS) is the preferred confirm atory method. Performed at [facility], [address] [place], [place] [address]     Current Facility-Administered Medications  Medication Dose Route Frequency Provider Last Rate Last Admin  . ALPRAZolam Prudy Feeler) tablet 0.5 mg  0.5 mg Oral Once Malijah Lietz, Jackquline Denmark, MD       Current Outpatient Medications  Medication Sig Dispense Refill  . carbamazepine (TEGRETOL) 200 MG tablet Take 400 mg by mouth at bedtime.    Marland Kitchen desvenlafaxine (PRISTIQ) 100 MG 24 hr tablet Take by mouth.    . doxepin (SINEQUAN) 10 MG capsule Take 10 mg by mouth at bedtime.    Marland Kitchen LORazepam (ATIVAN) 1 MG tablet Take 1 tablet (1 mg total) by mouth at bedtime. 10 tablet  0  . omeprazole (PRILOSEC) 40 MG capsule Take 1 capsule (40 mg total) by mouth 2 (two) times daily before a meal. 60 capsule 0  . polyethylene glycol powder (GLYCOLAX/MIRALAX) 17 GM/SCOOP powder Take 255 g by mouth daily. 255 g 3  . ziprasidone (GEODON) 40 MG capsule Take 40 mg by mouth 2 (two) times daily with a meal.      Musculoskeletal: Strength & Muscle Tone: within normal limits Gait & Station: normal Patient leans: N/A  Psychiatric Specialty Exam: Physical Exam Vitals and nursing note reviewed.  Constitutional:      Appearance: He is well-developed and well-nourished.  HENT:     Head: Normocephalic and  atraumatic.  Eyes:     Conjunctiva/sclera: Conjunctivae normal.     Pupils: Pupils are equal, round, and reactive to light.  Cardiovascular:     Heart sounds: Normal heart sounds.  Pulmonary:     Effort: Pulmonary effort is normal.  Abdominal:     Palpations: Abdomen is soft.  Musculoskeletal:        General: Normal range of motion.     Cervical back: Normal range of motion.  Skin:    General: Skin is warm and dry.  Neurological:     General: No focal deficit present.     Mental Status: He is alert.  Psychiatric:        Attention and Perception: He is inattentive.        Mood and Affect: Mood is anxious.        Speech: Speech is tangential.        Behavior: Behavior is agitated. Behavior is not aggressive or hyperactive.        Thought Content: Thought content is delusional. Thought content includes suicidal ideation. Thought content does not include homicidal ideation. Thought content does not include suicidal plan.        Cognition and Memory: Cognition is impaired.        Judgment: Judgment is impulsive.     Review of Systems  Constitutional: Negative.   HENT: Negative.   Eyes: Negative.   Respiratory: Negative.   Cardiovascular: Negative.   Gastrointestinal: Negative.   Musculoskeletal: Negative.   Skin: Negative.   Neurological: Negative.   Psychiatric/Behavioral: Positive for dysphoric mood, sleep disturbance and suicidal ideas. The patient is nervous/anxious.     Blood pressure (!) 130/91, pulse 88, temperature 98.5 F (36.9 C), temperature source Oral, resp. rate 19, weight 126 kg, SpO2 98 %.Body mass index is 34.72 kg/m.  General Appearance: Casual  Eye Contact:  Fair  Speech:  Clear and Coherent  Volume:  Increased  Mood:  Anxious and Dysphoric  Affect:  Congruent  Thought Process:  Disorganized  Orientation:  Full (Time, Place, and Person)  Thought Content:  Illogical and Paranoid Ideation  Suicidal Thoughts:  Yes.  without intent/plan  Homicidal  Thoughts:  No  Memory:  Immediate;   Fair Recent;   Poor Remote;   Poor  Judgement:  Impaired  Insight:  Shallow  Psychomotor Activity:  Restlessness  Concentration:  Concentration: Poor  Recall:  Poor  Fund of Knowledge:  Fair  Language:  Fair  Akathisia:  No  Handed:  Right  AIMS (if indicated):     Assets:  Desire for Improvement Housing Resilience Social Support  ADL's:  Impaired  Cognition:  Impaired,  Mild  Sleep:        Treatment Plan Summary: Daily contact with patient to assess and evaluate symptoms and progress in treatment,  Medication management and Plan Attempts have been made multiple times to treat this situation with reassurance watchful waiting and education all to no avail.  It is getting worse to the point that he is becoming disruptive and frightening to his mother and continues to make suicidal like statements.  I have reviewed the situation with the emergency room physician and agree that in this situation involuntary commitment is warranted for hospitalization and treatment particularly since the patient has refused suggestions to engage in outpatient mental health treatment.  Diagnosis appears to probably be some combination of panic attacks and somatic symptom disorder although obsessive-compulsive disorder should be considered as well.  I have informed the patient that we are going to insist on admitting him to a psychiatry ward.  IVC completed.  Labs are being done and evaluated.  Case reviewed with TTS.  I do not believe we have any further beds right now so we will probably need to refer him to other facilities while we wait for bed availability.  Disposition: Recommend psychiatric Inpatient admission when medically cleared. Supportive therapy provided about ongoing stressors.  Mordecai Rasmussen, MD 08/25/2020 11:50 AM

## 2020-08-25 NOTE — ED Notes (Signed)
Pt telling this Clinical research associate of his multiple physical complaints.  He is also requesting to speak to the ED again. Pt is adamant he does not have any mental health issues.

## 2020-08-25 NOTE — BH Assessment (Signed)
Comprehensive Clinical Assessment (CCA) Note  08/25/2020 Connor [name] [id number]  Connor [name] is a 20 year old Caucasian male who presents to the ER with the thoughts of being poisoned by radiation. Patient states this has been one on for approximately 47 days and no one is listening to him. Patient has had multiple visit [place] ER for same complaint. in the recent past patient had an MRI and since then he states the radiation has affected his body. Reported symptoms are his DNA is dying, his bones or breaking in our fragile, is that justice system is broken but add mitts too having regular bowel movements. He also reports he's unable to sleep because of the racing thoughts in the high level of radiation.  During the interview patient was cooperative and pleasant. He was hyper verbal and anxious. He expressed his concern that this writer did not understand what was bothering him then it was ignoring his symptoms. Patient denies suicidal or homicidal ideations. as well as denies auditory visual hallucinations. He has no history of aggression or violence, and no involvement in the legal system.  Chief Complaint: No chief complaint on file.  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 Self  Referral name: Self  Referral phone number: No data recorded  Whom do you see for routine medical problems? Primary Care  Practice/Facility Name: No data recorded Practice/Facility Phone Number: No data recorded Name of Contact: Gean Maidens Number: [phone]  Contact Fax Number: No data recorded Prescriber Name: No data recorded Prescriber Address (if known): No data recorded  What Is the Reason for Your Visit/Call Today? Believe he was exposed to radiation and dying.  How Long Has This Been Causing You Problems? 1-6 months  What Do You  Feel Would Help You the Most Today? Medication   Have You Recently Been in Any Inpatient Treatment (Hospital/Detox/Crisis Center/28-Day Program)? No  Name/Location of Program/Hospital:No data recorded How Long Were You There? No data recorded When Were You Discharged? No data recorded  Have You Ever Received Services From [facility] Before? No  Who Do You See at [facility]? Medical Treatment   Have You Recently Had Any Thoughts About Hurting Yourself? No  Are You Planning to Commit Suicide/Harm Yourself At This time? No   Have you Recently Had Thoughts About Hurting Someone [name]? No  Explanation: No data recorded  Have You Used Any Alcohol or Drugs in the Past 24 Hours? No  How Long Ago Did You Use Drugs or Alcohol? No data recorded What Did You Use and How Much? No data recorded  Do You Currently Have a Therapist/Psychiatrist? No  Name of Therapist/Psychiatrist: None   Have You Been Recently Discharged From Any Office Practice or Programs? No  Explanation of Discharge From Practice/Program: No data recorded    CCA Screening Triage Referral Assessment Type of Contact: Face-to-Face  Is this Initial or Reassessment? No data recorded Date Telepsych consult ordered in [facility]:  No data recorded Time Telepsych consult ordered in [facility]:  No data recorded  Patient Reported Information Reviewed? Yes  Patient Left Without Being Seen? No data recorded Reason for Not Completing Assessment: No data recorded  Collateral Involvement: n/a   Does Patient Have a Court Appointed Legal Guardian? No data recorded Name and Contact of Legal Guardian: No data recorded If Minor and Not Living with Parent(s), Who has Custody? n/a  Is CPS involved or ever been involved? Never  Is APS involved or ever been involved? Never   Patient Determined To Be At Risk for Harm To Self or Others Based on Review of Patient Reported Information or Presenting Complaint? No  Method: No data  recorded Availability of Means: No data recorded Intent: No data recorded Notification Required: No data recorded Additional Information for Danger to Others Potential: No data recorded Additional Comments for Danger to Others Potential: No data recorded Are There Guns or Other Weapons in Your Home? No data recorded Types of Guns/Weapons: No data recorded Are These Weapons Safely Secured?                            No data recorded Who Could Verify You Are Able To Have These Secured: No data recorded Do You Have any Outstanding Charges, Pending Court Dates, Parole/Probation? No data recorded Contacted To Inform of Risk of Harm To Self or Others: No data recorded  [facility]   Does Patient Present under Involuntary Commitment? Yes  IVC Papers Initial File Date: 08/25/2020   [place]   Patient Currently Receiving the Following Services: Not Receiving Services   Determination of Need: Emergent (2 hours)   Options For Referral: Inpatient Hospitalization     CCA Biopsychosocial Intake/Chief Complaint:  Believe he was exposed to radiation and dying.  Current Symptoms/Problems: Believe he was exposed to radiation and dying.   Patient Reported Schizophrenia/Schizoaffective Diagnosis in Past: No   Strengths: Able to share what his needs are  Preferences: Express to inpatient  Abilities: Able to care for himself   Type of Services Patient Feels are Needed: Medical work up   Initial Clinical Notes/Concerns: Patient paranoid and delusional.   Mental Health Symptoms Depression:  Hopelessness; Fatigue; Tearfulness   Duration of Depressive symptoms: Greater than two weeks   Mania:  None   Anxiety:   Difficulty concentrating; Restlessness; Sleep; Worrying; Tension   Psychosis:  Delusions   Duration of Psychotic symptoms: Less than six months   Trauma:  None   Obsessions:  Cause anxiety; Disrupts routine/functioning;  Intrusive/time consuming   Compulsions:  Disrupts with routine/functioning; Absent insight/delusional   Inattention:  None   Hyperactivity/Impulsivity:  N/A   Oppositional/Defiant Behaviors:  None   Emotional Irregularity:  None   Other Mood/Personality Symptoms:  No data recorded   Mental Status Exam Appearance and self-care  Stature:  Average   Weight:  Average weight   Clothing:  Neat/clean; Age-appropriate   Grooming:  Normal   Cosmetic use:  None   Posture/gait:  Normal   Motor activity:  -- (Within normal range)   Sensorium  Attention:  Distractible   Concentration:  Preoccupied   Orientation:  X5   Recall/memory:  Normal   Affect and Mood  Affect:  Anxious   Mood:  Anxious; Hopeless   Relating  Eye contact:  Fleeting   Facial expression:  Responsive   Attitude toward examiner:  Cooperative   Thought and Language  Speech flow: Normal; Clear and Coherent   Thought content:  Delusions; Ideas of Reference   Preoccupation:  Somatic   Hallucinations:  None   Organization:  No data recorded  [employer] ServicesExecutive Functions  Fund of Knowledge:  Fair   Intelligence:  Below average   Abstraction:  Normal   Judgement:  Fair   Reality Testing:  Distorted   Insight:  Good; Fair; None/zero  insight   Decision Making:  Impulsive   Social Functioning  Social Maturity:  Responsible   Social Judgement:  Normal   Stress  Stressors:  Illness   Coping Ability:  Overwhelmed; Deficient supports   Skill Deficits:  None   Supports:  Family     Religion: Religion/Spirituality Are You A Religious Person?: No  Leisure/Recreation: Leisure / Recreation Do You Have Hobbies?: No  Exercise/Diet: Exercise/Diet Do You Exercise?: No Have You Gained or Lost A Significant Amount of Weight in the Past Six Months?: No Do You Follow a Special Diet?: No Do You Have Any Trouble Sleeping?: No   CCA Employment/Education Employment/Work Situation: Employment  / Work Situation Employment situation: On disability Why is patient on disability: Mental Health Dx How long has patient been on disability: Unknown Patient's job has been impacted by current illness:  (Currently unemployed) What is the longest time patient has a held a job?: Unknown Where was the patient employed at that time?: Unknown Has patient ever been in the [employer]?: No  Education: Education Is Patient Currently Attending School?: No Last Grade Completed:  (Unknown) Name of High School: Unknown Did Garment/textile technologist From [employer]?:  (Unknown) Did You Attend College?:  (Unknown) Did You Attend Graduate School?:  (Unknown) Did You Have Any Special Interests In School?: Unknown Did You Have An Individualized Education Program (IIEP):  (Unknown) Did You Have Any Difficulty At School?:  (Unknown) Patient's Education Has Been Impacted by Current Illness:  (Unknown)   CCA Family/Childhood History Family and Relationship History: Family history Marital status: Single Are you sexually active?: No What is your sexual orientation?: Unknown Has your sexual activity been affected by drugs, alcohol, medication, or emotional stress?: Unknown Does patient have children?: No  Childhood History:  Childhood History By whom was/is the patient raised?: Mother Additional childhood history information: Reports it was good Description of patient's relationship with caregiver when they were a child: States it was good Patient's description of current relationship with people who raised him/her: States it's good How were you disciplined when you got in trouble as a child/adolescent?: reports of no problems Does patient have siblings?:  (Unknown) Did patient suffer any verbal/emotional/physical/sexual abuse as a child?: No Did patient suffer from severe childhood neglect?: No Has patient ever been sexually abused/assaulted/raped as an adolescent or adult?: No Was the patient ever a victim of a  crime or a disaster?: No Witnessed domestic violence?: No Has patient been affected by domestic violence as an adult?: No  Child/Adolescent Assessment:     CCA Substance Use Alcohol/Drug Use: Alcohol / Drug Use Pain Medications: See PTA Prescriptions: See PTA Over the Counter: See PTA History of alcohol / drug use?: No history of alcohol / drug abuse Longest period of sobriety (when/how long): n/a                         ASAM's:  Six Dimensions of Multidimensional Assessment  Dimension 1:  Acute Intoxication and/or Withdrawal Potential:      Dimension 2:  Biomedical Conditions and Complications:      Dimension 3:  Emotional, Behavioral, or Cognitive Conditions and Complications:     Dimension 4:  Readiness to Change:     Dimension 5:  Relapse, Continued use, or Continued Problem Potential:     Dimension 6:  Recovery/Living Environment:     ASAM Severity Score:    ASAM Recommended Level of Treatment:     Substance use Disorder (  SUD)    Recommendations for Services/Supports/Treatments:    DSM5 Diagnoses: Patient Active Problem List   Diagnosis Date Noted  . Panic disorder 08/25/2020  . 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 PTSD (post-traumatic stress disorder) 07/11/2020  . Major depressive disorder, recurrent episode (HCC) 07/11/2020  . 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 ADHD (attention deficit hyperactivity disorder) 07/08/2017  . Personality disorder (HCC) 07/08/2017  . Insomnia 06/09/2017  . Aggression 01/31/2016  . 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 Autism spectrum disorder associated with known medical or genetic condition or environmental factor, requiring substantial support (level 2) 11/06/2015  . 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

## 2020-08-25 NOTE — ED Notes (Signed)
RN tried to reassure patient the EDP is aware of his concerns and has been asked to speak with him if he has the time.

## 2020-08-25 NOTE — ED Notes (Signed)
The patient's mother called to make sure staff knew his current home medications and the name of his pharmacy.  Pharmacy tech has been given this information and will reconcile home medications. [facility])

## 2020-08-26 DIAGNOSIS — F22 Delusional disorders: Secondary | ICD-10-CM | POA: Diagnosis not present

## 2020-08-26 DIAGNOSIS — F410 Panic disorder [episodic paroxysmal anxiety] without agoraphobia: Secondary | ICD-10-CM | POA: Diagnosis not present

## 2020-08-26 MED ORDER — HYDROXYZINE HCL 25 MG PO TABS
50.0000 mg | ORAL_TABLET | Freq: Three times a day (TID) | ORAL | Status: DC | PRN
  Administered 2020-08-26 – 2020-08-27 (×3): 50 mg via ORAL
  Filled 2020-08-26 (×3): qty 2

## 2020-08-26 MED ORDER — LORAZEPAM 2 MG/ML IJ SOLN: 2.0000 mg | Freq: Once | INTRAMUSCULAR | Status: AC

## 2020-08-26 MED ORDER — LORAZEPAM 2 MG/ML IJ SOLN
INTRAMUSCULAR | Status: AC
  Administered 2020-08-26: 2 mg via INTRAMUSCULAR
  Filled 2020-08-26: qty 1

## 2020-08-26 MED ORDER — ARIPIPRAZOLE 5 MG PO TABS
5.0000 mg | ORAL_TABLET | Freq: Every day | ORAL | Status: DC
  Administered 2020-08-26 – 2020-08-27 (×2): 5 mg via ORAL
  Filled 2020-08-26 (×2): qty 1

## 2020-08-26 NOTE — ED Notes (Signed)
Pt to door stating he needs emergency medical attention- took the pt his breakfast tray and he began to explain that he needs help for radiation poisoning and that his symptoms include"electric shocks in his body" "radon breath" "his whole body is dry" and asking for "a respirator"- pt speaking in clear sentences, NAD noted- explained to pt that he has already been evaluated here and that there was nothing else our doctors could do- pt states "I do not need to be committed"- informed pt that this RN could not revoke papers

## 2020-08-26 NOTE — ED Notes (Signed)
Hourly rounding completed at this time, patient currently asleep in room. No complaints, stable, and in no acute distress. Q15 minute rounds and monitoring via Security Cameras to continue. 

## 2020-08-26 NOTE — ED Notes (Signed)
Patient demanding to see EDP. He said very loudly " I want to see medical doctor now! I am burning all over from the radiation." This writer was able to redirect the patient. Patient went back to his room."

## 2020-08-26 NOTE — ED Notes (Signed)
Pt given breakfast tray

## 2020-08-26 NOTE — ED Notes (Signed)
Hourly rounding reveals patient in room. No complaints, stable, in no acute distress. Q15 minute rounds and monitoring via Security Cameras to continue. 

## 2020-08-26 NOTE — ED Notes (Signed)
Patient complains about having bone and joint pain. He said his pain sore is 10/10. He also said he feels nauseated and about to faint. Per RN [place] patient has been complain and anxious about him being exposed to radiation 24/7.EDP notified.

## 2020-08-26 NOTE — ED Notes (Signed)
Pt becoming verbally aggressive after asking to speak with a medical doctor. States he "doesn't need to be here"; requesting to leave. Pt now yelling. Pt told EDP Siadecki will come speak with him when able. Pt denies specific reason other than that he doesn't feel he needs to stay. Pt pacing around. Notified EDP Siadecki via secure chat.

## 2020-08-26 NOTE — ED Notes (Signed)
Hourly rounding completed at this time, patient currently awake in room. No complaints, stable, and in no acute distress. Q15 minute rounds and monitoring via Security Cameras to continue. 

## 2020-08-26 NOTE — ED Notes (Signed)
Psych staff at bedside

## 2020-08-26 NOTE — ED Notes (Signed)
EDP Siadecki at bedside. 

## 2020-08-26 NOTE — BH Assessment (Signed)
Referral information for Psychiatric Hospitalization faxed to;   [facility] [place] [phone]),    [facility] [phone]. [phone])   [facility] [phone])

## 2020-08-26 NOTE — BH Assessment (Signed)
Writer spoke with the patient to complete an updated/reassessment. Patient denies SI/HI and AV/H.  However, he continues to voice delusional thoughts. He believes still suffering from radiation poison and ER staff doesn't believe him and don't not understand what he is dealing with.

## 2020-08-26 NOTE — ED Notes (Signed)
Pt continues to request to speak with medical doctor. Pt anxious and irritable stating he continues to receive "radiation" and has been for the last "48 hours". States needs to use a "radon breathalyzer to check the level of radioactivity".

## 2020-08-26 NOTE — ED Provider Notes (Signed)
Emergency Medicine Observation Re-evaluation Note  Connor [name] is a 20 y.o. male, seen on rounds today.  Pt initially presented to the ED for complaints of No chief complaint on file. Currently, the patient is unhappy that he is under involuntary commitment and is requesting medical work-up and treatment for radiation poisoning that he states occurred 48 days ago.  Physical Exam  BP (!) 125/93 (BP Location: Right Arm)   Pulse 100   Temp 98.2 F (36.8 C) (Oral)   Resp 18   Wt 126 kg   SpO2 100%   BMI 34.72 kg/m  Physical Exam General: Alert and oriented.  Comfortable appearing. Cardiac: Good peripheral perfusion. Lungs: Normal respiratory effort. Psych: Calm and cooperative.  ED Course / MDM   I have reviewed the labs performed to date as well as medications administered while in observation.  There have been no recent changes in the patient's status.  He was evaluate by psychiatry today, and they continue to recommend inpatient admission.  There are no current beds available so the patient is awaiting placement.  The patient continues to talk about radiation poisoning.  This is a longstanding complaint and he has no acute change today.  The patient has been medically cleared and I reviewed his work-up to date.  There is no indication for further medical work-up.  Plan  Current plan is for psychiatric admission. Patient is under full IVC at this time.   Dionne Bucy, MD 08/26/20 1501

## 2020-08-26 NOTE — ED Notes (Signed)
Report to include Situation, Background, Assessment, and Recommendations received from [facility]. Patient alert and oriented, warm and dry, in no acute distress. Patient denies SI, HI, AVH and pain. Patient made aware of Q15 minute rounds and security cameras for their safety. Patient instructed to come to me with needs or concerns.

## 2020-08-26 NOTE — Consult Note (Signed)
[facility] Progress Note  08/26/2020 1:27 PM Connor [name] Connor [name]  MRN:  [id number]   CC "You don't understand how bad it is."  Subjective: Follow-up for patient completed today. He had a CT scan perofrmed on Jan 14th for undiagnosed abdominal complaints. Since having this CT scan he has developed an intense fear that he has acute radiation sickness. He notes that he his insides are burning and painful, and he feels he is no longer able to absorb any foods. He is unable to be reassured by lack of findings on medical workup. He is also not receptive to explanation of somatic symptom disorders, or GI distress as side effects of depression or anxiety. He continues to express desire to die if he cannot feel better from this. He also endores that things are extraordinarily bright. He denies auditory hallucinations or homicidal ideations. RBA of Abilify discussed with patient. Will offer this today to see if this will assist with rumination, paranoia, and mood.   Principal Problem: Panic disorder Diagnosis:  Principal Problem:   Panic disorder Active Problems:   Somatic symptom disorder   Delusional disorder (HCC)  Total Time spent with patient: 45 minutes  Past Psychiatric History: Past history of possible autistic spectrum disorder, ADHD, neurofibromatosis. Normal IQ, own guardian.   Past Medical History:  Past Medical History:  Diagnosis Date  . ADHD (attention deficit hyperactivity disorder)   . Autism   . Depressed   . Neurofibromatosis (HCC)    Type 1  . Obesity   . Pertussis    as a infant    Past Surgical History:  Procedure Laterality Date  . MRI    . RADIOLOGY WITH ANESTHESIA N/A 08/13/2012   Procedure: RADIOLOGY WITH ANESTHESIA;  Surgeon: Medication Radiologist, MD;  Location: [facility] OR;  Service: Radiology;  Laterality: N/A;  MRI    Family History:  Family History  Problem Relation Age of Onset  . Anxiety disorder Mother   . Depression Mother   . OCD Mother   .  Hypertension Mother   . Cancer Maternal Aunt   . Cancer Maternal Grandmother   . Hypertension Father   . Schizophrenia Maternal Uncle    Family Psychiatric  History: Schizophrenia, and an uncle anxiety disorders, and OCD and close relatives Social History:  Social History   Substance and Sexual Activity  Alcohol Use No  . Alcohol/week: 0.0 standard drinks     Social History   Substance and Sexual Activity  Drug Use No    Social History   Socioeconomic History  . Marital status: Single    Spouse name: Not on file  . Number of children: Not on file  . Years of education: Not on file  . Highest education level: Not on file  Occupational History  . Not on file  Tobacco Use  . Smoking status: Former Smoker    Packs/day: 1.00    Years: 1.00    Pack years: 1.00    Types: Cigarettes    Quit date: 12/31/2014    Years since quitting: 5.6  . Smokeless tobacco: Never Used  Substance and Sexual Activity  . Alcohol use: No    Alcohol/week: 0.0 standard drinks  . Drug use: No  . Sexual activity: Not Currently    Birth control/protection: None  Other Topics Concern  . Not on file  Social History Narrative   Connor [name] is a high school drop out.   He lives with his mom only. He has one sister.  He enjoys eating, sleeping, and watching tv.   Social Determinants of Health   Financial Resource Strain: Not on file  Food Insecurity: Not on file  Transportation Needs: Not on file  Physical Activity: Not on file  Stress: Not on file  Social Connections: Not on file    Sleep: Fair  Appetite:  Fair  Current Medications: Current Facility-Administered Medications  Medication Dose Route Frequency Provider Last Rate Last Admin  . ALPRAZolam Prudy Feeler) tablet 0.5 mg  0.5 mg Oral Once Clapacs, John T, MD      . ARIPiprazole (ABILIFY) tablet 5 mg  5 mg Oral Daily Jesse Sans, MD      . hydrOXYzine (ATARAX/VISTARIL) tablet 50 mg  50 mg Oral TID PRN Jesse Sans, MD        Current Outpatient Medications  Medication Sig Dispense Refill  . carbamazepine (TEGRETOL) 200 MG tablet Take 400 mg by mouth at bedtime.    . carbamazepine (TEGRETOL) 200 MG tablet Take 200 mg by mouth daily.    Marland Kitchen desvenlafaxine (PRISTIQ) 100 MG 24 hr tablet Take 100 mg by mouth daily.    Marland Kitchen gabapentin (NEURONTIN) 300 MG capsule Take 300 mg by mouth at bedtime.    Marland Kitchen LORazepam (ATIVAN) 1 MG tablet Take 1 tablet (1 mg total) by mouth at bedtime. (Patient taking differently: Take 1 mg by mouth daily as needed for anxiety.) 10 tablet 0  . Multiple Vitamins-Minerals (MULTIVITAMIN GUMMIES MENS) CHEW Chew 1 tablet by mouth daily.    Marland Kitchen omeprazole (PRILOSEC) 40 MG capsule Take 1 capsule (40 mg total) by mouth 2 (two) times daily before a meal. 60 capsule 0  . polyethylene glycol powder (GLYCOLAX/MIRALAX) 17 GM/SCOOP powder Take 255 g by mouth daily. (Patient taking differently: Take 1 Container by mouth daily as needed for severe constipation.) 255 g 3  . ziprasidone (GEODON) 80 MG capsule Take 80 mg by mouth at bedtime.      Lab Results:  Results for orders placed or performed during the hospital encounter of 08/25/20 (from the past 48 hour(s))  CBC     Status: None   Collection Time: 08/25/20 10:42 AM  Result Value Ref Range   WBC 4.3 4.0 - 10.5 K/uL   RBC 5.11 4.22 - 5.81 MIL/uL   Hemoglobin 14.9 13.0 - 17.0 g/dL   HCT 16.1 09.6 - 04.5 %   MCV 88.1 80.0 - 100.0 fL   MCH 29.2 26.0 - 34.0 pg   MCHC 33.1 30.0 - 36.0 g/dL   RDW 40.9 81.1 - 91.4 %   Platelets 282 150 - 400 K/uL   nRBC 0.0 0.0 - 0.2 %    Comment: Performed at [facility], [address] [place], [place] [address]  Comprehensive metabolic panel     Status: Abnormal   Collection Time: 08/25/20 10:42 AM  Result Value Ref Range   Sodium 136 135 - 145 mmol/L   Potassium 3.9 3.5 - 5.1 mmol/L   Chloride 100 98 - 111 mmol/L   CO2 24 22 - 32 mmol/L   Glucose, Bld 76 70 - 99 mg/dL    Comment: Glucose reference range  applies only to samples taken after fasting for at least 8 hours.   BUN 11 6 - 20 mg/dL   Creatinine, Ser 6.21 0.61 - 1.24 mg/dL   Calcium 8.9 8.9 - 30.8 mg/dL   Total Protein 7.1 6.5 - 8.1 g/dL   Albumin 4.6 3.5 - 5.0 g/dL   AST  14 (L) 15 - 41 U/L   ALT 16 0 - 44 U/L   Alkaline Phosphatase 58 38 - 126 U/L   Total Bilirubin 1.1 0.3 - 1.2 mg/dL   GFR, Estimated >25 >75 mL/min    Comment: (NOTE) Calculated using the CKD-EPI Creatinine Equation (2021)    Anion gap 12 5 - 15    Comment: Performed at [facility], [address] [place], [place] [address]  Urinalysis, Complete w Microscopic     Status: Abnormal   Collection Time: 08/25/20 10:54 AM  Result Value Ref Range   Color, Urine YELLOW (A) YELLOW   APPearance CLEAR (A) CLEAR   Specific Gravity, Urine 1.029 1.005 - 1.030   pH 5.0 5.0 - 8.0   Glucose, UA NEGATIVE NEGATIVE mg/dL   Hgb urine dipstick NEGATIVE NEGATIVE   Bilirubin Urine NEGATIVE NEGATIVE   Ketones, ur 80 (A) NEGATIVE mg/dL   Protein, ur 30 (A) NEGATIVE mg/dL   Nitrite NEGATIVE NEGATIVE   Leukocytes,Ua NEGATIVE NEGATIVE   WBC, UA 0-5 0 - 5 WBC/hpf   Bacteria, UA NONE SEEN NONE SEEN   Squamous Epithelial / LPF 0-5 0 - 5   Mucus PRESENT     Comment: Performed at [facility], [address] [place], [place] [address]  Urine Drug Screen, Qualitative ([facility] only)     Status: Abnormal   Collection Time: 08/25/20 10:54 AM  Result Value Ref Range   Tricyclic, Ur Screen NONE DETECTED NONE DETECTED   Amphetamines, Ur Screen NONE DETECTED NONE DETECTED   MDMA (Ecstasy)Ur Screen NONE DETECTED NONE DETECTED   Cocaine Metabolite,Ur [place] NONE DETECTED NONE DETECTED   Opiate, Ur Screen NONE DETECTED NONE DETECTED   Phencyclidine (PCP) Ur S NONE DETECTED NONE DETECTED   Cannabinoid 50 Ng, Ur [place] NONE DETECTED NONE DETECTED   Barbiturates, Ur Screen NONE DETECTED NONE DETECTED   Benzodiazepine, Ur Scrn POSITIVE (A) NONE DETECTED   Methadone Scn, Ur NONE  DETECTED NONE DETECTED    Comment: (NOTE) Tricyclics + metabolites, urine    Cutoff 1000 ng/mL Amphetamines + metabolites, urine  Cutoff 1000 ng/mL MDMA (Ecstasy), urine              Cutoff 500 ng/mL Cocaine Metabolite, urine          Cutoff 300 ng/mL Opiate + metabolites, urine        Cutoff 300 ng/mL Phencyclidine (PCP), urine         Cutoff 25 ng/mL Cannabinoid, urine                 Cutoff 50 ng/mL Barbiturates + metabolites, urine  Cutoff 200 ng/mL Benzodiazepine, urine              Cutoff 200 ng/mL Methadone, urine                   Cutoff 300 ng/mL  The urine drug screen provides only a preliminary, unconfirmed analytical test result and should not be used for non-medical purposes. Clinical consideration and professional judgment should be applied to any positive drug screen result due to possible interfering substances. A more specific alternate chemical method must be used in order to obtain a confirmed analytical result. Gas chromatography / mass spectrometry (GC/MS) is the preferred confirm atory method. Performed at [facility], [address] [place], [place] [address]   Resp Panel by RT-PCR (Flu A&B, Covid) Nasopharyngeal Swab     Status: None   Collection Time: 08/25/20 11:44  AM   Specimen: Nasopharyngeal Swab; Nasopharyngeal(NP) swabs in vial transport medium  Result Value Ref Range   SARS Coronavirus 2 by RT PCR NEGATIVE NEGATIVE    Comment: (NOTE) SARS-CoV-2 target nucleic acids are NOT DETECTED.  The SARS-CoV-2 RNA is generally detectable in upper respiratory specimens during the acute phase of infection. The lowest concentration of SARS-CoV-2 viral copies this assay can detect is 138 copies/mL. A negative result does not preclude SARS-Cov-2 infection and should not be used as the sole basis for treatment or other patient management decisions. A negative result may occur with  improper specimen collection/handling, submission of specimen  other than nasopharyngeal swab, presence of viral mutation(s) within the areas targeted by this assay, and inadequate number of viral copies(<138 copies/mL). A negative result must be combined with clinical observations, patient history, and epidemiological information. The expected result is Negative.  Fact Sheet for Patients:  BloggerCourse.com  Fact Sheet for Healthcare Providers:  SeriousBroker.it  This test is no t yet approved or cleared by the [place] FDA and  has been authorized for detection and/or diagnosis of SARS-CoV-2 by FDA under an Emergency Use Authorization (EUA). This EUA will remain  in effect (meaning this test can be used) for the duration of the COVID-19 declaration under Section 564(b)(1) of the Act, 21 U.S.C.section 360bbb-3(b)(1), unless the authorization is terminated  or revoked sooner.       Influenza A by PCR NEGATIVE NEGATIVE   Influenza B by PCR NEGATIVE NEGATIVE    Comment: (NOTE) The Xpert Xpress SARS-CoV-2/FLU/RSV plus assay is intended as an aid in the diagnosis of influenza from Nasopharyngeal swab specimens and should not be used as a sole basis for treatment. Nasal washings and aspirates are unacceptable for Xpert Xpress SARS-CoV-2/FLU/RSV testing.  Fact Sheet for Patients: BloggerCourse.com  Fact Sheet for Healthcare Providers: SeriousBroker.it  This test is not yet approved or cleared by the [place] FDA and has been authorized for detection and/or diagnosis of SARS-CoV-2 by FDA under an Emergency Use Authorization (EUA). This EUA will remain in effect (meaning this test can be used) for the duration of the COVID-19 declaration under Section 564(b)(1) of the Act, 21 U.S.C. section 360bbb-3(b)(1), unless the authorization is terminated or revoked.  Performed at [facility], [address] [place], [place]  [address]     Blood Alcohol level:  Lab Results  Component Value Date   [facility] <10 07/11/2020   ETH <10 09/01/2018    Physical Findings: AIMS:  , ,  ,  ,    CIWA:    COWS:     Musculoskeletal: Strength & Muscle Tone: within normal limits Gait & Station: normal Patient leans: N/A  Psychiatric Specialty Exam: Physical Exam Vitals and nursing note reviewed.  Constitutional:      General: He is in acute distress.  HENT:     Head: Normocephalic and atraumatic.     Right Ear: External ear normal.     Left Ear: External ear normal.     Nose: Nose normal.     Mouth/Throat:     Mouth: Mucous membranes are moist.     Pharynx: Oropharynx is clear.  Eyes:     Extraocular Movements: Extraocular movements intact.     Conjunctiva/sclera: Conjunctivae normal.     Pupils: Pupils are equal, round, and reactive to light.  Cardiovascular:     Rate and Rhythm: Normal rate.     Pulses: Normal pulses.  Pulmonary:     Effort: Pulmonary  effort is normal.     Breath sounds: Normal breath sounds.  Abdominal:     General: Abdomen is flat.     Palpations: Abdomen is soft.  Musculoskeletal:        General: No swelling. Normal range of motion.     Cervical back: Normal range of motion and neck supple.  Skin:    General: Skin is warm and dry.  Neurological:     General: No focal deficit present.     Mental Status: He is alert and oriented to person, place, and time.  Psychiatric:        Mood and Affect: Mood is anxious.        Speech: Speech normal.        Behavior: Behavior is withdrawn.        Thought Content: Thought content is paranoid and delusional.        Judgment: Judgment is inappropriate.     Review of Systems  Constitutional: Positive for appetite change. Negative for fever.  HENT: Negative for rhinorrhea and sore throat.   Eyes: Negative for photophobia and visual disturbance.  Respiratory: Negative for cough and shortness of breath.   Cardiovascular: Negative for chest pain  and palpitations.  Gastrointestinal: Positive for abdominal pain, constipation and nausea.  Endocrine: Negative for cold intolerance and heat intolerance.  Genitourinary: Negative for difficulty urinating and dysuria.  Musculoskeletal: Positive for myalgias. Negative for arthralgias.  Skin: Negative for rash and wound.  Allergic/Immunologic: Negative for environmental allergies and food allergies.  Neurological: Negative for dizziness and headaches.  Hematological: Negative for adenopathy. Does not bruise/bleed easily.  Psychiatric/Behavioral: Positive for confusion, dysphoric mood and suicidal ideas. The patient is nervous/anxious.     Blood pressure (!) 125/93, pulse 100, temperature 98.2 F (36.8 C), temperature source Oral, resp. rate 18, weight 126 kg, SpO2 100 %.Body mass index is 34.72 kg/m.  General Appearance: Fairly Groomed  Eye Contact:  Minimal  Speech:  Normal Rate  Volume:  Decreased  Mood:  Anxious  Affect:  Congruent  Thought Process:  Disorganized  Orientation:  Full (Time, Place, and Person)  Thought Content:  Illogical, Delusions, Paranoid Ideation and Rumination  Suicidal Thoughts:  Yes.  without intent/plan  Homicidal Thoughts:  No  Memory:  Immediate;   Fair  Judgement:  Impaired  Insight:  Lacking  Psychomotor Activity:  Restlessness  Concentration:  Concentration: Fair  Recall:  [employer] of Knowledge:  Fair  Language:  Fair  Akathisia:  Negative  Handed:  Right  AIMS (if indicated):     Assets:  Desire for Improvement Financial Resources/Insurance Housing Social Support  ADL's:  Intact  Cognition:  Impaired,  Mild  Sleep:         Treatment Plan Summary: Daily contact with patient to assess and evaluate symptoms and progress in treatment and Medication management 20 year old male with history of possible autism spectrum disorder presenting with increased anxiety, delusions of radiation poisoning, and suicidal ideation without plan. Differential  includes somatic symptom disorder, OCD, panic attacks, delusional disorder, or brief psychotic disorder. Will start Abilify 5 mg daily, and offer Vistaril 50 mg TID PRN for anxiety. Patient continues to meet IVC criteria, but no bed currently available on our unit. TTS will continue to refer to other facilities in the meantime.   Disposition: Recommend psychiatric Inpatient admission Supportive therapy provided about ongoing stressors.  Jesse Sans, MD 08/26/2020, 1:27 PM

## 2020-08-26 NOTE — ED Notes (Signed)
IVC/Pending Placement 

## 2020-08-27 DIAGNOSIS — F22 Delusional disorders: Secondary | ICD-10-CM | POA: Diagnosis not present

## 2020-08-27 DIAGNOSIS — F410 Panic disorder [episodic paroxysmal anxiety] without agoraphobia: Secondary | ICD-10-CM | POA: Diagnosis not present

## 2020-08-27 MED ORDER — ZIPRASIDONE HCL 20 MG PO CAPS
20.0000 mg | ORAL_CAPSULE | Freq: Once | ORAL | Status: AC
  Administered 2020-08-27: 20 mg via ORAL
  Filled 2020-08-27: qty 1

## 2020-08-27 MED ORDER — ZIPRASIDONE MESYLATE 20 MG IM SOLR
20.0000 mg | Freq: Once | INTRAMUSCULAR | Status: AC
  Administered 2020-08-27: 20 mg via INTRAMUSCULAR
  Filled 2020-08-27: qty 20

## 2020-08-27 NOTE — ED Notes (Signed)
Hourly rounding reveals patient in room. No complaints, stable, in no acute distress. Q15 minute rounds and monitoring via Security Cameras to continue. 

## 2020-08-27 NOTE — ED Notes (Signed)
VS not taken, Patient asleep. 

## 2020-08-27 NOTE — ED Notes (Signed)
Report to include Situation, Background, Assessment, and Recommendations received from Ariel RN. Patient alert and oriented, warm and dry, in no acute distress. Patient denies SI, HI, AVH and pain. Patient made aware of Q15 minute rounds and security cameras for their safety. Patient instructed to come to me with needs or concerns.  

## 2020-08-27 NOTE — BH Assessment (Signed)
Per pt's nurse Hewan, pt's mother requested pt be discharged due to pt having a colonoscopy appointment that he urgently needs to attend.

## 2020-08-27 NOTE — BH Assessment (Signed)
Referral check for Psychiatric Hospitalization faxed to;    [facility] ([phone]- [phone]), No answer   [facility] [phone])  Left a voicemail  [facility] [phone]) Per Misty Stanley, pt has not been reviewed at this time.

## 2020-08-27 NOTE — ED Notes (Signed)
Snack and beverage given. 

## 2020-08-27 NOTE — ED Provider Notes (Signed)
-----------------------------------------   7:36 AM on 08/27/2020 -----------------------------------------  Of note, the patient's mother called and is very concerned because the patient has an endoscopy scheduled on Tuesday with Dr. Allegra Lai.  The mother hopes that if the patient has the procedure then he will be less focused on his delusion of radiation poisoning.  He will need to start doing bowel prep on Monday if he is to have the procedure on Tuesday.  I explained to his mother that I do not have direct control over the situation but that I would pass along a message to Dr. Toni Amend with psychiatry as well as Dr. Allegra Lai with gastroenterology who is scheduled to perform the procedure.  I explained that it is our belief that he is not psychiatrically stable for a procedure at this time but that would be a decision for the behavioral medicine and gastroenterology teams to make together.  I also reiterated that the patient is under involuntary commitment and that he cannot be picked up by a family member and leave the department without being cleared by psychiatry.  The mother is understandably upset and tearful about the situation and I tried to provide reassurance that we would take the best care of him possible.  I sent a message through [facility] to the in basket of Dr. Toni Amend and Dr.Vanga to make them aware of the situation so that they could coordinate to best care for the patient.     Loleta Rose, MD 08/27/20 [phone]

## 2020-08-27 NOTE — BH Assessment (Signed)
Referral Check   Connor [name] ([phone]- [phone]), Connor [name] reports denied due to no programming for autism   [facility]([phone]) Connor [name] request re-fax to [phone]. task completed at 10:30am    [name]([phone]) Connor [name] reports denied due to violence and aggression   TTS made contact with Connor [name] at [facility] [phone]) who confirmed no referral received for pt. Referral packet faxed to [facility] at 10:30am- No authorization code needed for this referral and TTS will follow up accordingly.

## 2020-08-27 NOTE — ED Notes (Signed)
Pt to window several times to ask for MD to come and see him, pt continues to tell staff that he does not want to hurt himself and that he needs to be discharged.  Pt advised that he has been evaluated by a medical physician and that he has been cleared and is awaiting placement at a facility where he can be better served.  Pt continues to tell staff that he has radiation poisoning.  Upon returning to his room pt begins to yell very loudly.  This RN and security to room to advise pt that he can not yell on the unit as it disturbs other patients.  Pt continues to argue with RN.  After several attempts, RN convinces pt that he can not yell on the unit and pt agrees to take AM medications.

## 2020-08-27 NOTE — BH Assessment (Addendum)
This Clinical research associate contacted Vonna Kotyk at [facility] [phone]) who confirmed that referral has been received. Writer completed demographics screening with Vonna Kotyk. Vonna Kotyk reports that referral information will now have to be evaluated and will contact TTS back.    Update 08/28/20 2:13am- TTS contacted [facility] back and spoke with Pam who reports that patient referral information will be reviewed by the doctor in the morning later today.

## 2020-08-27 NOTE — ED Notes (Signed)
Pt given shower supplies at this time.  

## 2020-08-27 NOTE — Consult Note (Signed)
[facility] Progress Note  08/27/2020 3:55 PM Lowella Dandydward Allen Mullarkey III  MRN:  [id number]   CC "I can't absorb anything"  Subjective: Follow-up for patient completed today. He continues to have fixed delusion of acute radiation poisoning. He feels that food is no longer absorbed in his body, and has backed up to his chest. He continues to endorse abdominal pain and nausea. He feels the radiation is causing his organs to disintegrate. He denies suicidal ideations, homicidal ideations, auditory hallucinations, and visual hallucinations. Attempts to explain somatic symptom disorder and GI manifestations from depression and anxiety continue to be ill received. However, patient was compliant with Abilify this morning. He notes that no medications for anxiety or depression will work because he is unable to absorb medications. He requests that I speak to his mother.   Contacted Guerry BruinDawn Fodera [phone]: His mother reiterates that her son truly believes he has radiation poisoning. He has called 911 multiple times to the point that he was warned they would press charges for wrongful use of 911. She notes he has had GI problems since November that have caused his anxiety to increased. He is scheduled for an endoscopy and colonoscopy on Tuesday that she wants to get done. She feels that knowing these tests are negative will help decrease his anxiety. She notes that he has expressed that he wants to die several times, and has also asked her to kill him. She does not feel that patient would hurt himself, however, at the same time she has hidden away all the knives and cutlery in the home. I expressed that I was not comfortable discharging her son today given the suicidal threats. Also discussed that with delusions negative medical tests often to not decrease anxiety or depression as evidenced by his worsening anxiety despite negative workup. However, reiterated that Dr. Allegra LaiVanga and Dr. Toni Amendlapacs were alerted of  situation and upcoming colonoscopy on Tuesday. Decision on whether to proceed with colonoscopy has not been determined. She expressed understanding.   Principal Problem: Panic disorder Diagnosis:  Principal Problem:   Panic disorder Active Problems:   Somatic symptom disorder   Delusional disorder (HCC)  Total Time spent with patient: 45 minutes  Past Psychiatric History: See previous  Past Medical History:  Past Medical History:  Diagnosis Date  . ADHD (attention deficit hyperactivity disorder)   . Autism   . Depressed   . Neurofibromatosis (HCC)    Type 1  . Obesity   . Pertussis    as a infant    Past Surgical History:  Procedure Laterality Date  . MRI    . RADIOLOGY WITH ANESTHESIA N/A 08/13/2012   Procedure: RADIOLOGY WITH ANESTHESIA;  Surgeon: Medication Radiologist, MD;  Location: [facility] OR;  Service: Radiology;  Laterality: N/A;  MRI    Family History:  Family History  Problem Relation Age of Onset  . Anxiety disorder Mother   . Depression Mother   . OCD Mother   . Hypertension Mother   . Cancer Maternal Aunt   . Cancer Maternal Grandmother   . Hypertension Father   . Schizophrenia Maternal Uncle    Family Psychiatric  History: See previous Social History:  Social History   Substance and Sexual Activity  Alcohol Use No  . Alcohol/week: 0.0 standard drinks     Social History   Substance and Sexual Activity  Drug Use No    Social History   Socioeconomic History  . Marital status: Single    Spouse name: Not  on file  . Number of children: Not on file  . Years of education: Not on file  . Highest education level: Not on file  Occupational History  . Not on file  Tobacco Use  . Smoking status: Former Smoker    Packs/day: 1.00    Years: 1.00    Pack years: 1.00    Types: Cigarettes    Quit date: 12/31/2014    Years since quitting: 5.6  . Smokeless tobacco: Never Used  Substance and Sexual Activity  . Alcohol use: No    Alcohol/week: 0.0 standard  drinks  . Drug use: No  . Sexual activity: Not Currently    Birth control/protection: None  Other Topics Concern  . Not on file  Social History Narrative   Danis is a high school drop out.   He lives with his mom only. He has one sister.   He enjoys eating, sleeping, and watching tv.   Social Determinants of Health   Financial Resource Strain: Not on file  Food Insecurity: Not on file  Transportation Needs: Not on file  Physical Activity: Not on file  Stress: Not on file  Social Connections: Not on file    Sleep: Fair  Appetite:  Fair  Current Medications: Current Facility-Administered Medications  Medication Dose Route Frequency Provider Last Rate Last Admin  . ALPRAZolam Prudy Feeler) tablet 0.5 mg  0.5 mg Oral Once Clapacs, John T, MD      . ARIPiprazole (ABILIFY) tablet 5 mg  5 mg Oral Daily Les Pou M, MD   5 mg at 08/27/20 1027  . hydrOXYzine (ATARAX/VISTARIL) tablet 50 mg  50 mg Oral TID PRN Jesse Sans, MD   50 mg at 08/26/20 2143   Current Outpatient Medications  Medication Sig Dispense Refill  . carbamazepine (TEGRETOL) 200 MG tablet Take 400 mg by mouth at bedtime.    . carbamazepine (TEGRETOL) 200 MG tablet Take 200 mg by mouth daily.    Marland Kitchen desvenlafaxine (PRISTIQ) 100 MG 24 hr tablet Take 100 mg by mouth daily.    Marland Kitchen gabapentin (NEURONTIN) 300 MG capsule Take 300 mg by mouth at bedtime.    Marland Kitchen LORazepam (ATIVAN) 1 MG tablet Take 1 tablet (1 mg total) by mouth at bedtime. (Patient taking differently: Take 1 mg by mouth daily as needed for anxiety.) 10 tablet 0  . Multiple Vitamins-Minerals (MULTIVITAMIN GUMMIES MENS) CHEW Chew 1 tablet by mouth daily.    Marland Kitchen omeprazole (PRILOSEC) 40 MG capsule Take 1 capsule (40 mg total) by mouth 2 (two) times daily before a meal. 60 capsule 0  . polyethylene glycol powder (GLYCOLAX/MIRALAX) 17 GM/SCOOP powder Take 255 g by mouth daily. (Patient taking differently: Take 1 Container by mouth daily as needed for severe  constipation.) 255 g 3  . ziprasidone (GEODON) 80 MG capsule Take 80 mg by mouth at bedtime.      Lab Results:  No results found for this or any previous visit (from the past 48 hour(s)).  Blood Alcohol level:  Lab Results  Component Value Date   ETH <10 07/11/2020   ETH <10 09/01/2018    Physical Findings: AIMS:  , ,  ,  ,    CIWA:    COWS:     Musculoskeletal: Strength & Muscle Tone: within normal limits Gait & Station: normal Patient leans: N/A  Psychiatric Specialty Exam: Physical Exam Vitals and nursing note reviewed.  Constitutional:      General: He is in acute distress.  HENT:  Head: Normocephalic and atraumatic.     Right Ear: External ear normal.     Left Ear: External ear normal.     Nose: Nose normal.     Mouth/Throat:     Mouth: Mucous membranes are moist.     Pharynx: Oropharynx is clear.  Eyes:     Extraocular Movements: Extraocular movements intact.     Conjunctiva/sclera: Conjunctivae normal.     Pupils: Pupils are equal, round, and reactive to light.  Cardiovascular:     Rate and Rhythm: Normal rate.     Pulses: Normal pulses.  Pulmonary:     Effort: Pulmonary effort is normal.     Breath sounds: Normal breath sounds.  Abdominal:     General: Abdomen is flat.     Palpations: Abdomen is soft.  Musculoskeletal:        General: No swelling. Normal range of motion.     Cervical back: Normal range of motion and neck supple.  Skin:    General: Skin is warm and dry.  Neurological:     General: No focal deficit present.     Mental Status: He is alert and oriented to person, place, and time.  Psychiatric:        Mood and Affect: Mood is anxious.        Speech: Speech normal.        Behavior: Behavior is withdrawn.        Thought Content: Thought content is paranoid and delusional.        Judgment: Judgment is inappropriate.     Review of Systems  Constitutional: Positive for appetite change. Negative for fever.  HENT: Negative for  rhinorrhea and sore throat.   Eyes: Negative for photophobia and visual disturbance.  Respiratory: Negative for cough and shortness of breath.   Cardiovascular: Negative for chest pain and palpitations.  Gastrointestinal: Positive for abdominal pain, constipation and nausea.  Endocrine: Negative for cold intolerance and heat intolerance.  Genitourinary: Negative for difficulty urinating and dysuria.  Musculoskeletal: Positive for myalgias. Negative for arthralgias.  Skin: Negative for rash and wound.  Allergic/Immunologic: Negative for environmental allergies and food allergies.  Neurological: Negative for dizziness and headaches.  Hematological: Negative for adenopathy. Does not bruise/bleed easily.  Psychiatric/Behavioral: Positive for confusion, dysphoric mood and suicidal ideas. The patient is nervous/anxious.     Blood pressure 133/84, pulse (!) 110, temperature 98.6 F (37 C), temperature source Oral, resp. rate 18, weight 126 kg, SpO2 99 %.Body mass index is 34.72 kg/m.  General Appearance: Fairly Groomed  Eye Contact:  Minimal  Speech:  Normal Rate  Volume:  Decreased  Mood:  Anxious  Affect:  Congruent  Thought Process:  Disorganized  Orientation:  Full (Time, Place, and Person)  Thought Content:  Illogical, Delusions, Paranoid Ideation and Rumination  Suicidal Thoughts:  Yes.  without intent/plan  Homicidal Thoughts:  No  Memory:  Immediate;   Fair  Judgement:  Impaired  Insight:  Lacking  Psychomotor Activity:  Restlessness  Concentration:  Concentration: Fair  Recall:  [employer] of Knowledge:  Fair  Language:  Fair  Akathisia:  Negative  Handed:  Right  AIMS (if indicated):     Assets:  Desire for Improvement Financial Resources/Insurance Housing Social Support  ADL's:  Intact  Cognition:  Impaired,  Mild  Sleep:         Treatment Plan Summary: Daily contact with patient to assess and evaluate symptoms and progress in treatment and Medication management  20 year old  male with history of possible autism spectrum disorder presenting with increased anxiety, delusions of radiation poisoning, and suicidal ideation without plan. Differential includes illness anxiety disorder, OCD, panic attacks, delusional disorder, or brief psychotic disorder. Continue Abilify 5 mg daily, and offer Vistaril 50 mg TID PRN for anxiety. Patient continues to meet IVC criteria, but no bed currently available on our unit. TTS will continue to refer to other facilities in the meantime.   Disposition: Recommend psychiatric Inpatient admission Supportive therapy provided about ongoing stressors.  Jesse Sans, MD 08/27/2020, 3:55 PM

## 2020-08-27 NOTE — ED Notes (Signed)
Pt given breakfast tray and juice. 

## 2020-08-27 NOTE — ED Notes (Signed)
Patient's mother called and informed this Clinical research associate that patient has colonoscopy and endoscopy appointment on Tuesday 08/29/2020 and she wants to take him for his appointment. This Clinical research associate relayed the message to on coming nurse and TTS.

## 2020-08-27 NOTE — ED Notes (Signed)
Pt refuses lunch.

## 2020-08-27 NOTE — BH Assessment (Addendum)
Referral information for Psychiatric Hospitalization faxed to;   Additional facilities:  . [facility] [phone]), No answer  . [facility] [phone] or [phone] reports facility is currently at capacity

## 2020-08-27 NOTE — BH Assessment (Signed)
Patient referred to [facility] Referral Form and supporting documentation completed and faxed to [facility].  This Clinical research associate spoke with [facility] representative Olegario Shearer [phone]) about auth/tracking number. Landon agreed to follow up in the AM (08/27/20).   Authorization/Tracking # and verbal screening with [facility] Pending

## 2020-08-27 NOTE — ED Notes (Signed)
Pt again to window several times and asking staff to turn off the electricity to the hospital.  Pt informed that this can not happen.  Pt then requests to be discharged.  Pt also informed that this will not happen.  Pt at this point states "I will yell in here all day."  Pt asked to go to his room when he states "I will not".  Pt becoming more irritated at this time.  MD Derrill Kay notified.

## 2020-08-28 ENCOUNTER — Telehealth: Payer: Self-pay | Admitting: Gastroenterology

## 2020-08-28 ENCOUNTER — Encounter: Payer: Self-pay | Admitting: Emergency Medicine

## 2020-08-28 ENCOUNTER — Emergency Department (EMERGENCY_DEPARTMENT_HOSPITAL)
Admission: EM | Admit: 2020-08-28 | Discharge: 2020-08-28 | Disposition: A | Discharge status: Home / Self Care | Payer: Medicare Other | Source: Home / Self Care | Attending: Emergency Medicine | Admitting: Emergency Medicine

## 2020-08-28 ENCOUNTER — Emergency Department
Admission: EM | Admit: 2020-08-28 | Discharge: 2020-08-28 | Disposition: A | Discharge status: Home / Self Care | Payer: Medicare Other | Source: Home / Self Care | Attending: Emergency Medicine | Admitting: Emergency Medicine

## 2020-08-28 ENCOUNTER — Other Ambulatory Visit: Payer: Self-pay

## 2020-08-28 DIAGNOSIS — F840 Autistic disorder: Secondary | ICD-10-CM | POA: Insufficient documentation

## 2020-08-28 DIAGNOSIS — F410 Panic disorder [episodic paroxysmal anxiety] without agoraphobia: Secondary | ICD-10-CM

## 2020-08-28 DIAGNOSIS — R208 Other disturbances of skin sensation: Secondary | ICD-10-CM | POA: Insufficient documentation

## 2020-08-28 DIAGNOSIS — F22 Delusional disorders: Secondary | ICD-10-CM | POA: Insufficient documentation

## 2020-08-28 DIAGNOSIS — Z87891 Personal history of nicotine dependence: Secondary | ICD-10-CM | POA: Insufficient documentation

## 2020-08-28 DIAGNOSIS — R6889 Other general symptoms and signs: Secondary | ICD-10-CM

## 2020-08-28 DIAGNOSIS — F4325 Adjustment disorder with mixed disturbance of emotions and conduct: Secondary | ICD-10-CM | POA: Diagnosis not present

## 2020-08-28 DIAGNOSIS — F339 Major depressive disorder, recurrent, unspecified: Secondary | ICD-10-CM | POA: Diagnosis present

## 2020-08-28 DIAGNOSIS — R4689 Other symptoms and signs involving appearance and behavior: Secondary | ICD-10-CM | POA: Diagnosis present

## 2020-08-28 DIAGNOSIS — G4700 Insomnia, unspecified: Secondary | ICD-10-CM | POA: Diagnosis present

## 2020-08-28 DIAGNOSIS — F909 Attention-deficit hyperactivity disorder, unspecified type: Secondary | ICD-10-CM | POA: Diagnosis present

## 2020-08-28 MED ORDER — CARBAMAZEPINE 200 MG PO TABS: 400.0000 mg | ORAL_TABLET | Freq: Every day | ORAL | Status: DC

## 2020-08-28 MED ORDER — VENLAFAXINE HCL ER 75 MG PO CP24: 150.0000 mg | ORAL_CAPSULE | Freq: Every day | ORAL | Status: DC

## 2020-08-28 MED ORDER — LORAZEPAM 2 MG PO TABS
2.0000 mg | ORAL_TABLET | ORAL | Status: AC
  Administered 2020-08-28: 2 mg via ORAL
  Filled 2020-08-28: qty 1

## 2020-08-28 MED ORDER — ZIPRASIDONE MESYLATE 20 MG IM SOLR
20.0000 mg | Freq: Once | INTRAMUSCULAR | Status: AC
  Administered 2020-08-28: 20 mg via INTRAMUSCULAR
  Filled 2020-08-28: qty 20

## 2020-08-28 MED ORDER — POLYETHYLENE GLYCOL 3350 17 G PO PACK
17.0000 g | PACK | Freq: Every day | ORAL | Status: DC | PRN
  Administered 2020-08-28: 17 g via ORAL
  Filled 2020-08-28: qty 1

## 2020-08-28 MED ORDER — CARBAMAZEPINE 200 MG PO TABS: 200.0000 mg | ORAL_TABLET | Freq: Every day | ORAL | Status: DC

## 2020-08-28 MED ORDER — PANTOPRAZOLE SODIUM 40 MG PO TBEC
40.0000 mg | DELAYED_RELEASE_TABLET | Freq: Two times a day (BID) | ORAL | Status: DC
  Administered 2020-08-28: 40 mg via ORAL
  Filled 2020-08-28: qty 1

## 2020-08-28 MED ORDER — LORAZEPAM 1 MG PO TABS
1.0000 mg | ORAL_TABLET | Freq: Every day | ORAL | Status: DC | PRN
  Administered 2020-08-28: 1 mg via ORAL
  Filled 2020-08-28: qty 1

## 2020-08-28 MED ORDER — GABAPENTIN 300 MG PO CAPS
300.0000 mg | ORAL_CAPSULE | Freq: Every day | ORAL | Status: DC
Start: 2020-08-28 — End: 2020-08-28

## 2020-08-28 MED ORDER — ZIPRASIDONE HCL 20 MG PO CAPS: 80.0000 mg | ORAL_CAPSULE | Freq: Every day | ORAL | Status: DC

## 2020-08-28 NOTE — Consult Note (Signed)
[facility] Face-to-Face Psychiatry Consult   Reason for Consult: Follow-up for 20 year old man with severe anxiety Referring Physician: Quale Patient Identification: Tome Wilson MRN:  [id number] Principal Diagnosis: Panic disorder Diagnosis:  Principal Problem:   Panic disorder Active Problems:   Somatic symptom disorder   Delusional disorder (HCC)   Total Time spent with patient: 30 minutes  Subjective:   Avir Deruiter III is a 20 y.o. male patient admitted with "I do not feel safe with all this radiation".  HPI: Patient seen chart reviewed.  Patient continues to be extremely anxious and unhappy.  He is consumed by the belief that radiation and electricity in the hospital that is causing him harm somehow.  Continues to be completely focused on his somatic complaints and impossible to redirect or to engage in any kind of conversation to soothe his anxiety.  Nevertheless he is denying any suicidal or homicidal ideation and has not acted to harm himself.  I spoke with his mother today who is in favor of discharge.  Patient is scheduled to have a colonoscopy and possibly an endoscopy tomorrow and both of them are very invested in having the procedure done with a strong belief that this is going to somehow provide him with a diagnosis and treatment for his complaints.  I am skeptical but on the other hand at this point we do not seem to be in a position to admit him to the psychiatry ward and he has consistently refused to take any psychiatric medicine.  Mother informed me, which I had previously been unaware of, that he is actually seeing a psychiatrist outpatient and is supposed to be on medicine but has been consistently noncompliant.  Past Psychiatric History: History of possible autistic spectrum disorder and also severe anxiety symptoms.  Apparently outpatient doctor has diagnosed him with PTSD although from what trauma I am not clear.  To me he seems to have severe OCD and  panic-like symptoms.  Risk to Self:   Risk to Others:   Prior Inpatient Therapy:   Prior Outpatient Therapy:    Past Medical History:  Past Medical History:  Diagnosis Date  . ADHD (attention deficit hyperactivity disorder)   . Autism   . Depressed   . Neurofibromatosis (HCC)    Type 1  . Obesity   . Pertussis    as a infant    Past Surgical History:  Procedure Laterality Date  . MRI    . RADIOLOGY WITH ANESTHESIA N/A 08/13/2012   Procedure: RADIOLOGY WITH ANESTHESIA;  Surgeon: Medication Radiologist, MD;  Location: [facility] OR;  Service: Radiology;  Laterality: N/A;  MRI    Family History:  Family History  Problem Relation Age of Onset  . Anxiety disorder Mother   . Depression Mother   . OCD Mother   . Hypertension Mother   . Cancer Maternal Aunt   . Cancer Maternal Grandmother   . Hypertension Father   . Schizophrenia Maternal Uncle    Family Psychiatric  History: See previous Social History:  Social History   Substance and Sexual Activity  Alcohol Use No  . Alcohol/week: 0.0 standard drinks     Social History   Substance and Sexual Activity  Drug Use No    Social History   Socioeconomic History  . Marital status: Single    Spouse name: Not on file  . Number of children: Not on file  . Years of education: Not on file  . Highest education level: Not on file  Occupational History  . Not on file  Tobacco Use  . Smoking status: Former Smoker    Packs/day: 1.00    Years: 1.00    Pack years: 1.00    Types: Cigarettes    Quit date: 12/31/2014    Years since quitting: 5.6  . Smokeless tobacco: Never Used  Substance and Sexual Activity  . Alcohol use: No    Alcohol/week: 0.0 standard drinks  . Drug use: No  . Sexual activity: Not Currently    Birth control/protection: None  Other Topics Concern  . Not on file  Social History Narrative   Tyland is a high school drop out.   He lives with his mom only. He has one sister.   He enjoys eating, sleeping, and  watching tv.   Social Determinants of Health   Financial Resource Strain: Not on file  Food Insecurity: Not on file  Transportation Needs: Not on file  Physical Activity: Not on file  Stress: Not on file  Social Connections: Not on file   Additional Social History:    Allergies:  No Known Allergies  Labs: No results found for this or any previous visit (from the past 48 hour(s)).  Current Facility-Administered Medications  Medication Dose Route Frequency Provider Last Rate Last Admin  . ARIPiprazole (ABILIFY) tablet 5 mg  5 mg Oral Daily Jesse Sans, MD   5 mg at 08/27/20 1027  . carbamazepine (TEGRETOL) tablet 200 mg  200 mg Oral Daily Ward, Kristen N, DO      . carbamazepine (TEGRETOL) tablet 400 mg  400 mg Oral QHS Ward, Kristen N, DO      . gabapentin (NEURONTIN) capsule 300 mg  300 mg Oral QHS Ward, Kristen N, DO      . hydrOXYzine (ATARAX/VISTARIL) tablet 50 mg  50 mg Oral TID PRN Jesse Sans, MD   50 mg at 08/27/20 2056  . LORazepam (ATIVAN) tablet 1 mg  1 mg Oral Daily PRN Ward, Kristen N, DO   1 mg at 08/28/20 1093  . pantoprazole (PROTONIX) EC tablet 40 mg  40 mg Oral BID AC Ward, Kristen N, DO   40 mg at 08/28/20 2355  . polyethylene glycol (MIRALAX / GLYCOLAX) packet 17 g  17 g Oral Daily PRN Ward, Kristen N, DO   17 g at 08/28/20 7322  . venlafaxine XR (EFFEXOR-XR) 24 hr capsule 150 mg  150 mg Oral Q breakfast Ward, Kristen N, DO      . ziprasidone (GEODON) capsule 80 mg  80 mg Oral QHS Ward, Layla Maw, DO       Current Outpatient Medications  Medication Sig Dispense Refill  . carbamazepine (TEGRETOL) 200 MG tablet Take 400 mg by mouth at bedtime.    . carbamazepine (TEGRETOL) 200 MG tablet Take 200 mg by mouth daily.    Marland Kitchen desvenlafaxine (PRISTIQ) 100 MG 24 hr tablet Take 100 mg by mouth daily.    Marland Kitchen gabapentin (NEURONTIN) 300 MG capsule Take 300 mg by mouth at bedtime.    Marland Kitchen LORazepam (ATIVAN) 1 MG tablet Take 1 tablet (1 mg total) by mouth at bedtime.  (Patient taking differently: Take 1 mg by mouth daily as needed for anxiety.) 10 tablet 0  . Multiple Vitamins-Minerals (MULTIVITAMIN GUMMIES MENS) CHEW Chew 1 tablet by mouth daily.    Marland Kitchen omeprazole (PRILOSEC) 40 MG capsule Take 1 capsule (40 mg total) by mouth 2 (two) times daily before a meal. 60 capsule 0  . polyethylene glycol  powder (GLYCOLAX/MIRALAX) 17 GM/SCOOP powder Take 255 g by mouth daily. (Patient taking differently: Take 1 Container by mouth daily as needed for severe constipation.) 255 g 3  . ziprasidone (GEODON) 80 MG capsule Take 80 mg by mouth at bedtime.      Musculoskeletal: Strength & Muscle Tone: within normal limits Gait & Station: normal Patient leans: N/A  Psychiatric Specialty Exam: Physical Exam Vitals and nursing note reviewed.  Constitutional:      Appearance: He is well-developed and well-nourished.  HENT:     Head: Normocephalic and atraumatic.  Eyes:     Conjunctiva/sclera: Conjunctivae normal.     Pupils: Pupils are equal, round, and reactive to light.  Cardiovascular:     Heart sounds: Normal heart sounds.  Pulmonary:     Effort: Pulmonary effort is normal.  Abdominal:     Palpations: Abdomen is soft.  Musculoskeletal:        General: Normal range of motion.     Cervical back: Normal range of motion.  Skin:    General: Skin is warm and dry.  Neurological:     General: No focal deficit present.     Mental Status: He is alert.  Psychiatric:        Attention and Perception: He is inattentive.        Mood and Affect: Mood is anxious.        Speech: Speech is delayed.        Behavior: Behavior is slowed.        Thought Content: Thought content is paranoid and delusional. Thought content does not include homicidal or suicidal ideation.        Cognition and Memory: Cognition is impaired.        Judgment: Judgment is inappropriate.     Review of Systems  Constitutional: Negative.   HENT: Negative.   Eyes: Negative.   Respiratory: Negative.    Cardiovascular: Negative.   Gastrointestinal: Negative.   Musculoskeletal: Negative.   Skin: Negative.   Neurological: Negative.   Psychiatric/Behavioral: Negative for suicidal ideas. The patient is nervous/anxious.     Blood pressure (!) 147/101, pulse 90, temperature 98 F (36.7 C), temperature source Oral, resp. rate 18, weight 126 kg, SpO2 100 %.Body mass index is 34.72 kg/m.  General Appearance: Disheveled  Eye Contact:  Fair  Speech:  Clear and Coherent  Volume:  Decreased  Mood:  Anxious and Dysphoric  Affect:  Congruent  Thought Process:  Disorganized  Orientation:  Full (Time, Place, and Person)  Thought Content:  Illogical, Paranoid Ideation, Rumination and Tangential  Suicidal Thoughts:  No  Homicidal Thoughts:  No  Memory:  Immediate;   Fair Recent;   Fair Remote;   Fair  Judgement:  Impaired  Insight:  Lacking  Psychomotor Activity:  Normal  Concentration:  Concentration: Poor  Recall:  Poor  Fund of Knowledge:  Poor  Language:  Poor  Akathisia:  No  Handed:  Right  AIMS (if indicated):     Assets:  Desire for Improvement Housing Social Support  ADL's:  Impaired  Cognition:  Impaired,  Mild  Sleep:        Treatment Plan Summary: Plan This is a 20 year old man with profound anxiety and somatic complaints with no clear physiologic basis who refuses to engage in any kind of helpful dialogue or treatment plan around his mood and anxiety.  At the request of the patient and his mother he will be discharged from the emergency room today back home.  He and mother both feel that he is safe at home and not likely to be of any harm to himself.  I have strongly encouraged him to restart the medicine prescribed by his outpatient psychiatrist.  He will have a GI work-up tomorrow after which I sincerely hope he feels better otherwise we will, I am sure, be seeing him back in the emergency room before long.  Disposition: No evidence of imminent risk to self or others at  present.   Supportive therapy provided about ongoing stressors.  Mordecai RasmussenJohn Brysun Eschmann, MD 08/28/2020 9:54 AM

## 2020-08-28 NOTE — ED Notes (Signed)
Hourly rounding reveals patient in room. No complaints, stable, in no acute distress. Q15 minute rounds and monitoring via Security Cameras to continue. 

## 2020-08-28 NOTE — ED Triage Notes (Signed)
Arrives c/o persistent burning all over for the past 51 days since radiation exposure. Asks if hospital has a radon breath test.  AAOx3.  Skin warm and dry. NAD.  Denies SI/ HI.

## 2020-08-28 NOTE — ED Notes (Signed)
Patient refused , patient state's the water makes his skin burn

## 2020-08-28 NOTE — Telephone Encounter (Signed)
Patient mom states she is pick him up from the hospital in a little bit. She states she told the doctor she wanted him discharge so he could have the procedure. She states she will see [place] tomorrow for the procedure.

## 2020-08-28 NOTE — ED Notes (Signed)
Patient screaming loudly " Turn the electricity off." I am exposed to radiation, I can't breath, I am in so much pain". I want to leave now." He is disturbing the unit. Unable to redirect. EDP notified.

## 2020-08-28 NOTE — Consult Note (Addendum)
[facility] Face-to-Face Psychiatry Consult   Reason for Consult: Burning all over Referring Physician: Dr. Katrinka Blazing Patient Identification: Connor [name] MRN:  [id number] Principal Diagnosis: <principal problem not specified> Diagnosis:  Active Problems:   Aggression   Insomnia   ADHD (attention deficit hyperactivity disorder)   Adjustment disorder with mixed disturbance of emotions and conduct   Major depressive disorder, recurrent episode (HCC)   Delusional disorder (HCC)   Total Time spent with patient: 20 minutes  Subjective: " I am not crazy. I have a medical problems." Connor [name] is a 20 y.o. male patient presented to [facility] ED via POV voluntarily due to paranoia he was exposed to radiation 51 days ago. Per the ED Triage nurse note, Pt to ED via EMS from home, pt states he was exposed to radiation 51 days ago and now believes he has "actue radiation syndrome" states at first he was anorexic, has had burning sensation throughout his body, light sensitivity, and nausea. Pt was seen here earlier today and states that nothing was done for him. Pt had CT scan 51 days ago because he thought he had a parasite in his chest. Pt states he feels like his intestines are dead, pt states he hasn't had a bowel movement in 1.5 months. Pt denies SI/HI The patient was seen just a few days ago in the emergency room for the same problem. The patient had some CT scans a couple of weeks ago and, ever since then, has been convinced that they have caused a multitude of somatic symptoms. He talks about feeling like he is breaking inside, cannot sleep at night, feels like everything is "too bright."   The patient was seen face-to-face by this provider; the chart was reviewed and consulted with  Dr. Vicente Males on 08/28/2020 due to the patient's care. It was discussed with the EDP that the patient does not meet the criteria to be admitted to the psychiatric inpatient unit.  Discussion with Dr. Vicente Males  that Dr. Toni Amend saw the patient and this was his disposition this morning: with profound anxiety and somatic complaints with no clear physiologic basis who refuses to engage in any kind of helpful dialogue or treatment plan around his mood and anxiety.  At the request of the patient and his mother he will be discharged from the emergency room today back home.  He and mother both feel that he is safe at home and not likely to be of any harm to himself.  I have strongly encouraged him to restart the medicine prescribed by his outpatient psychiatrist.   The patient presented with delusional behaviors on today's visit. The patient was seen earlier today and was discharged by Dr. Toni Amend. On evaluation, the patient is alert and oriented x 4, calm, cooperative, and mood-congruent with affect. The patient does not appear to be responding to internal or external stimuli. The patient is presenting with some delusional thinking. The patient denies auditory or visual hallucinations. The patient denies any suicidal, homicidal, or self-harm ideations. He answered questions appropriately during an encounter with the patient, but he was upset why he was being seen by psychiatry.  Plans: The patient is not a safety risk to himself or others and does not require psychiatric inpatient admission for stabilization and treatment.  HPI: Per Dr, Katrinka Blazing, Connor [name] is a 20 y.o. male with a past medical history of ADHD, autism spectrum disorder, depression, neurofibromatosis, obesity and known delusion about radiation poisoning who presents requesting assessment because  he feels he is burning all over and has not improved. States his symptoms have been ongoing with at least the last 50 days and nothing he is doing is helping. States he was seen by a psychiatrist this morning who referred him to his psychiatrist he is seen on outpatient basis but does not want to this is a feels like does not quite help. He denies any  new symptoms today including headache, earache, sore throat, chest pain, cough, shortness of breath, vomiting, diarrhea, dysuria, rash or other acute symptoms. Denies any SI HI or hallucinations. He is requesting radon testing. Denies illicit drug use EtOH use or tobacco abuse  Past Psychiatric History:   ADHD (attention deficit hyperactivity disorder)  Autism  Depressed   Risk to Self:  No Risk to Others:  No Prior Inpatient Therapy:  Unknown Prior Outpatient Therapy:  No  Past Medical History:  Past Medical History:  Diagnosis Date  . ADHD (attention deficit hyperactivity disorder)   . Autism   . Depressed   . Neurofibromatosis (HCC)    Type 1  . Obesity   . Pertussis    as a infant    Past Surgical History:  Procedure Laterality Date  . MRI    . RADIOLOGY WITH ANESTHESIA N/A 08/13/2012   Procedure: RADIOLOGY WITH ANESTHESIA;  Surgeon: Medication Radiologist, MD;  Location: [facility] OR;  Service: Radiology;  Laterality: N/A;  MRI    Family History:  Family History  Problem Relation Age of Onset  . Anxiety disorder Mother   . Depression Mother   . OCD Mother   . Hypertension Mother   . Cancer Maternal Aunt   . Cancer Maternal Grandmother   . Hypertension Father   . Schizophrenia Maternal Uncle    Family Psychiatric  History:  Social History:  Social History   Substance and Sexual Activity  Alcohol Use No  . Alcohol/week: 0.0 standard drinks     Social History   Substance and Sexual Activity  Drug Use No    Social History   Socioeconomic History  . Marital status: Single    Spouse name: Not on file  . Number of children: Not on file  . Years of education: Not on file  . Highest education level: Not on file  Occupational History  . Not on file  Tobacco Use  . Smoking status: Former Smoker    Packs/day: 1.00    Years: 1.00    Pack years: 1.00    Types: Cigarettes    Quit date: 12/31/2014    Years since quitting: 5.6  . Smokeless tobacco: Never Used   Substance and Sexual Activity  . Alcohol use: No    Alcohol/week: 0.0 standard drinks  . Drug use: No  . Sexual activity: Not Currently    Birth control/protection: None  Other Topics Concern  . Not on file  Social History Narrative   Volney is a high school drop out.   He lives with his mom only. He has one sister.   He enjoys eating, sleeping, and watching tv.   Social Determinants of Health   Financial Resource Strain: Not on file  Food Insecurity: Not on file  Transportation Needs: Not on file  Physical Activity: Not on file  Stress: Not on file  Social Connections: Not on file   Additional Social History:    Allergies:  No Known Allergies  Labs:  No results found for this or any previous visit (from the past 48 hour(s)).  No current facility-administered medications for this encounter.   Current Outpatient Medications  Medication Sig Dispense Refill  . carbamazepine (TEGRETOL) 200 MG tablet Take 400 mg by mouth at bedtime.    . carbamazepine (TEGRETOL) 200 MG tablet Take 200 mg by mouth daily.    Marland Kitchen desvenlafaxine (PRISTIQ) 100 MG 24 hr tablet Take 100 mg by mouth daily.    Marland Kitchen gabapentin (NEURONTIN) 300 MG capsule Take 300 mg by mouth at bedtime.    Marland Kitchen LORazepam (ATIVAN) 1 MG tablet Take 1 tablet (1 mg total) by mouth at bedtime. (Patient taking differently: Take 1 mg by mouth daily as needed for anxiety.) 10 tablet 0  . Multiple Vitamins-Minerals (MULTIVITAMIN GUMMIES MENS) CHEW Chew 1 tablet by mouth daily.    Marland Kitchen omeprazole (PRILOSEC) 40 MG capsule Take 1 capsule (40 mg total) by mouth 2 (two) times daily before a meal. 60 capsule 0  . polyethylene glycol powder (GLYCOLAX/MIRALAX) 17 GM/SCOOP powder Take 255 g by mouth daily. (Patient taking differently: Take 1 Container by mouth daily as needed for severe constipation.) 255 g 3  . ziprasidone (GEODON) 80 MG capsule Take 80 mg by mouth at bedtime.      Musculoskeletal: Strength & Muscle Tone: within normal  limits Gait & Station: normal Patient leans: N/A  Psychiatric Specialty Exam: Physical Exam Vitals and nursing note reviewed.  Constitutional:      Appearance: Normal appearance. He is obese.  HENT:     Right Ear: External ear normal.     Left Ear: External ear normal.     Nose: Nose normal.     Mouth/Throat:     Mouth: Mucous membranes are moist.  Eyes:     Conjunctiva/sclera: Conjunctivae normal.  Pulmonary:     Effort: Pulmonary effort is normal.  Musculoskeletal:        General: Normal range of motion.     Cervical back: Normal range of motion and neck supple.  Neurological:     Mental Status: He is alert and oriented to person, place, and time. Mental status is at baseline.  Psychiatric:        Attention and Perception: Attention and perception normal.        Mood and Affect: Mood and affect normal.        Speech: Speech normal.        Behavior: Behavior normal. Behavior is cooperative.        Thought Content: Thought content is delusional.        Cognition and Memory: Cognition and memory normal.        Judgment: Judgment normal.     Review of Systems  Psychiatric/Behavioral: Positive for sleep disturbance.  All other systems reviewed and are negative.   Blood pressure (!) 129/97, pulse (!) 111, temperature 98.3 F (36.8 C), temperature source Oral, resp. rate 18, height 6\' 3"  (1.905 m), weight 126 kg, SpO2 97 %.Body mass index is 34.72 kg/m.  General Appearance: Casual and Guarded  Eye Contact:  Good  Speech:  Clear and Coherent and Slow  Volume:  Normal  Mood:  Anxious and Euthymic  Affect:  Congruent and Flat  Thought Process:  Coherent  Orientation:  Full (Time, Place, and Person)  Thought Content:  Logical and Paranoid Ideation  Suicidal Thoughts:  No  Homicidal Thoughts:  No  Memory:  Immediate;   Good Recent;   Good Remote;   Good  Judgement:  Fair  Insight:  Fair  Psychomotor Activity:  Normal  Concentration:  Concentration: Good and Attention  Span: Good  Recall:  Good  Fund of Knowledge:  Good  Language:  Good  Akathisia:  Negative  Handed:  Right  AIMS (if indicated):     Assets:  Communication Skills Desire for Improvement Resilience Social Support  ADL's:  Intact  Cognition:  WNL  Sleep:   Insomnia     Treatment Plan Summary: Plan The patient is not a safety risk to himself or others and does not require psychiatric inpatient admission for stabilization and treatment. Needs to be followed up as an outpatient.  Disposition: No evidence of imminent risk to self or others at present.   Patient does not meet criteria for psychiatric inpatient admission. Supportive therapy provided about ongoing stressors. Discussed crisis plan, support from social network, calling 911, coming to the Emergency Department, and calling Suicide Hotline.  Gillermo MurdochJacqueline Icesis Renn, NP 08/28/2020 9:59 PM

## 2020-08-28 NOTE — ED Notes (Addendum)
Patient refused his breakfast at this time, will offer again

## 2020-08-28 NOTE — Telephone Encounter (Signed)
Patient's mom called LVM to let us know patient went back into hospital and will not be able to have procedure tomorrow.  Please cancel

## 2020-08-28 NOTE — ED Provider Notes (Signed)
Emergency Medicine Observation Re-evaluation Note  Connor [name] is a 20 y.o. male, seen on rounds today.  Pt initially presented to the ED for complaints of radiation poisoning Currently, the patient is resting, sleeping in his room.  Of note the patient did have to receive intramuscular Geodon during the end of the evening shift for elevated levels of agitation and paranoia discussed with Dr. Elesa Massed.  Physical Exam  BP (!) 147/101 (BP Location: Left Arm)   Pulse 90   Temp 98 F (36.7 C) (Oral)   Resp 18   Wt 126 kg   SpO2 100%   BMI 34.72 kg/m  Physical Exam General: Alerts to voice, sits up, conversant fixated on levels of radiation in the lighting electrical work air.  He does not appear in any distress though he appears medically without complaint except for what appears to be a fixed type paranoia Cardiac: Normal perfusion, normal skin tone appears warm well perfused Lungs: Clear voice, no distress.  Denies difficulty Psych: Alert, well oriented, but very much fixated on radiation levels  ED Course / MDM  EKG:    I have reviewed the labs performed to date as well as medications administered while in observation.  Recent changes in the last 24 hours include receiving IM Geodon.  Plan  Current plan is for psychiatric inpatient care. Patient is under full IVC at this time.   Sharyn Creamer, MD 08/28/20 [phone]

## 2020-08-28 NOTE — Telephone Encounter (Signed)
Patient's mom called back to let us know that her son is being discharged within the hour.

## 2020-08-28 NOTE — ED Notes (Signed)
VS not taken, patient asleep 

## 2020-08-28 NOTE — Telephone Encounter (Signed)
I will see him today, please let her know  RV

## 2020-08-28 NOTE — Telephone Encounter (Signed)
Informed patient mother I did not know if she could still do procedure since he was not admitted for a GI issues. I informed mom I would ask the provider if she could. Mother states if she can not she will discharge him so he can have the procedure. Informed mom I would not recommend him to get discharge till the providers at the hospital think he is ready to be discharge. She starts crying and states there is something wrong with her son and it is not mental. Please advise what you recommend

## 2020-08-28 NOTE — ED Notes (Signed)
Rescinded by Dr. Clapacs 

## 2020-08-28 NOTE — ED Triage Notes (Addendum)
Pt to ED via EMS from home, pt states he was exposed to radiation 51 days ago and now believes he has "actue radiation syndrome" states at first he was anorexic, has had burning sensation throughout his body, light sensitivity, and nausea. Pt was seen here earlier today and states that nothing was done for him. Pt had CT scan 51 days ago because he thought he had a parasite in his chest. Pt states he feels like his intestines are dead, pt states he hasn't had a bowel movement in 1.5 months. Pt denies SI/HI

## 2020-08-28 NOTE — ED Provider Notes (Signed)
[facility] Emergency Department Provider Note  ____________________________________________   Event Date/Time   First MD Initiated Contact with Patient 08/28/20 1321     (approximate)  I have reviewed the triage vital signs and the nursing notes.   HISTORY  Chief Complaint burning all over   HPI Connor [name] is a 20 y.o. male with a past medical history of ADHD, autism spectrum disorder, depression, neurofibromatosis, obesity and known delusion about radiation poisoning who presents requesting assessment because he feels he is burning all over and has not improved. States his symptoms have been ongoing with at least the last 50 days and nothing he is doing is helping. States he was seen by a psychiatrist this morning who referred him to his psychiatrist he is seen on outpatient basis but does not want to this is a feels like does not quite help. He denies any new symptoms today including headache, earache, sore throat, chest pain, cough, shortness of breath, vomiting, diarrhea, dysuria, rash or other acute symptoms. Denies any SI HI or hallucinations. He is requesting radon testing. Denies illicit drug use EtOH use or tobacco abuse         Past Medical History:  Diagnosis Date  . ADHD (attention deficit hyperactivity disorder)   . Autism   . Depressed   . Neurofibromatosis (HCC)    Type 1  . Obesity   . Pertussis    as a infant    Patient Active Problem List   Diagnosis Date Noted  . Panic disorder 08/25/2020  . 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 PTSD (post-traumatic stress disorder) 07/11/2020  . Major depressive disorder, recurrent episode (HCC) 07/11/2020  . 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 ADHD (attention deficit hyperactivity disorder) 07/08/2017  . Personality disorder (HCC) 07/08/2017  . Insomnia 06/09/2017  . Aggression  01/31/2016  . 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 Autism spectrum disorder associated with known medical or genetic condition or environmental factor, requiring substantial support (level 2) 11/06/2015  . 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 MRI    . RADIOLOGY WITH ANESTHESIA N/A 08/13/2012   Procedure: RADIOLOGY WITH ANESTHESIA;  Surgeon: Medication Radiologist, MD;  Location: [facility] OR;  Service: Radiology;  Laterality: N/A;  MRI     Prior to Admission medications   Medication Sig Start Date End Date Taking? Authorizing Provider  carbamazepine (TEGRETOL) 200 MG tablet Take 400 mg by mouth at bedtime.    [provider]  carbamazepine (TEGRETOL) 200 MG tablet Take 200 mg by mouth daily.    [provider]  desvenlafaxine (PRISTIQ) 100 MG 24 hr tablet Take 100 mg by mouth daily.    [provider]  gabapentin (NEURONTIN) 300 MG capsule Take 300 mg by mouth at bedtime. 08/02/20   [provider]  LORazepam (ATIVAN) 1 MG tablet Take 1 tablet (1 mg total) by mouth at bedtime. Patient taking differently: Take 1 mg by mouth daily as needed for anxiety. 08/22/20 08/22/21  Joni Reining, PA-C  Multiple Vitamins-Minerals (MULTIVITAMIN GUMMIES MENS) CHEW Chew 1 tablet by mouth daily.    [provider]  omeprazole (PRILOSEC) 40 MG capsule Take 1 capsule (40 mg total) by mouth 2 (two) times daily before a meal. 07/26/20 08/25/20  Toney Reil, MD  polyethylene glycol powder (GLYCOLAX/MIRALAX) 17 GM/SCOOP powder Take 255 g by mouth daily. Patient taking differently: Take 1 Container by mouth daily as needed for severe constipation. 07/26/20   Toney Reil, MD  ziprasidone (GEODON) 80 MG capsule Take 80 mg by mouth at bedtime.    [provider]    Allergies Patient has no known  allergies.  Family History  Problem Relation Age of Onset  . Anxiety disorder Mother   . Depression Mother   . OCD Mother   . Hypertension Mother   . Cancer Maternal Aunt   . Cancer Maternal Grandmother   . Hypertension Father   . Schizophrenia Maternal Uncle     Social History Social History   Tobacco Use  . Smoking status: Former Smoker    Packs/day: 1.00    Years: 1.00    Pack years: 1.00    Types: Cigarettes    Quit date: 12/31/2014    Years since quitting: 5.6  . Smokeless tobacco: Never Used  Substance Use Topics  . Alcohol use: No    Alcohol/week: 0.0 standard drinks  . Drug use: No    Review of Systems  Review of Systems  Constitutional: Negative for chills and fever.  HENT: Negative for sore throat.   Eyes: Negative for pain.  Respiratory: Negative for cough and stridor.   Cardiovascular: Negative for chest pain.  Gastrointestinal: Negative for vomiting.  Genitourinary: Negative for dysuria.  Musculoskeletal: Positive for myalgias ( burning all over).  Skin: Negative for rash.  Neurological: Negative for seizures, loss of consciousness and headaches.  Psychiatric/Behavioral: Negative for suicidal ideas.  All other systems reviewed and are negative.     ____________________________________________   PHYSICAL EXAM:  VITAL SIGNS: ED Triage Vitals  Enc Vitals Group     BP 08/28/20 1229 135/85     Pulse Rate 08/28/20 1229 91     Resp 08/28/20 1229 17     Temp 08/28/20 1229 98.6 F (37 C)     Temp Source 08/28/20 1229 Oral     SpO2 08/28/20 1229 97 %     Weight 08/28/20 1205 277 lb 12.5 oz (126 kg)     Height 08/28/20 1205 6\' 3"  (1.905 m)     Head Circumference --      Peak Flow --      Pain Score 08/28/20 1205 0     Pain Loc --      Pain Edu? --      Excl. in GC? --    Vitals:   08/28/20 1229  BP: 135/85  Pulse: 91  Resp: 17  Temp: 98.6 F (37 C)  SpO2: 97%   Physical Exam Vitals and nursing note reviewed.  Constitutional:       Appearance: He is well-developed and well-nourished.  HENT:     Head: Normocephalic and atraumatic.     Right Ear: External ear normal.     Left Ear: External ear normal.     Nose: Nose normal.  Eyes:     Conjunctiva/sclera: Conjunctivae normal.  Cardiovascular:     Rate and Rhythm: Normal rate and regular rhythm.     Heart sounds: No murmur heard.   Pulmonary:     Effort: Pulmonary effort is normal. No respiratory distress.     Breath sounds: Normal breath sounds.  Abdominal:     Palpations: Abdomen is soft.     Tenderness: There is no abdominal tenderness.  Musculoskeletal:  General: No edema.     Cervical back: Neck supple.  Skin:    General: Skin is warm and dry.  Neurological:     Mental Status: He is alert and oriented to person, place, and time.  Psychiatric:        Mood and Affect: Mood and affect and mood normal.        Thought Content: Thought content is delusional. Thought content does not include homicidal or suicidal ideation.        Cognition and Memory: Cognition is impaired.      ____________________________________________   LABS (all labs ordered are listed, but only abnormal results are displayed)  Labs Reviewed - No data to display ____________________________________________  EKG  ____________________________________________  RADIOLOGY  ED MD interpretation:   Official radiology report(s): No results found.  ____________________________________________   PROCEDURES  Procedure(s) performed (including Critical Care):  Procedures   ____________________________________________   INITIAL IMPRESSION / ASSESSMENT AND PLAN / ED COURSE      Patient presents with above-stated exam complaining of myalgias all over and concerns that he has ongoing radiation poisoning that he thinks began about a month ago.  Patient has been seen by this examiner and multiple others over the last couple of months for symptoms of diffuse myalgias.  He  was seen by Dr. Toni Amend earlier today and released with recommendation to follow-up with his psychiatrist as there is some concern that patient has a delusional disorder.  On arrival he is afebrile hemodynamically stable.  He does not appear to be suicidal or homicidal or actively psychotic on my exam.  He denies any other acute symptoms and I will suspicion for acute traumatic injury, acute infectious process or significant metabolic derangement contributing to his presentation today as he has had multiple metabolic work-ups for the symptoms in the past that have all been unrevealing.  He denies any illegal drug use and presentation today is consistent with his delusional disorder he has radiation poisoning.  Advised patient that strongly recommend he follow-up with [facility] and his PCP is I am unable to change how he is feeling today in the emergency setting and I do not believe he requires hospitalization for further work-up at this time.  Patient discharged stable condition.  Return precautions advised and discussed.        ____________________________________________   FINAL CLINICAL IMPRESSION(S) / ED DIAGNOSES  Final diagnoses:  Feeling bad    Medications - No data to display   ED Discharge Orders    None       Note:  This document was prepared using Dragon voice recognition software and may include unintentional dictation errors.   Gilles Chiquito, MD 08/28/20 [phone]

## 2020-08-28 NOTE — ED Provider Notes (Signed)
6:40 AM  Pt is becoming increasingly agitated despite taking his oral home medications. He is screaming out and requesting evaluation for concerns for his radiation poisoning. He is under IVC at this time for his psychosis. Discussed with nurse and she feels patient would agree to taking IM medications to help with his agitation. He does not need restraints at this time. Will give IM Geodon.   Elea Holtzclaw, Layla Maw, DO 08/28/20 [phone]

## 2020-08-28 NOTE — Telephone Encounter (Signed)
Appointment is canceled for procedure.

## 2020-08-28 NOTE — ED Notes (Addendum)
Pt continually knocking on door asking that the power be turned off. He says that the radiation is causing burning and stabbing pain all over his body.  Pt is not aggressive physically or verbally.

## 2020-08-28 NOTE — ED Notes (Signed)
Pts belongings:  Advertising copywriter tshirt Black underwear

## 2020-08-28 NOTE — ED Notes (Signed)
Pt. Alert and oriented, warm and dry, in no distress. Pt. Denies SI, HI, and AVH. Pt states he is here for same issues as earlier. He is having burning all over due to radiation poisoning from 51 days ago. Pt. Encouraged to let nursing staff know of any concerns or needs.

## 2020-08-28 NOTE — ED Notes (Signed)
Pt calm , collective , denied pain, sob, or suicidal thoughts. Ambulatory upon discharge . Discharge instructions reviewed with pt.

## 2020-08-28 NOTE — ED Notes (Signed)
Pt to door stating something about being exposed to electromagnetic fields and he needs to lights off now. Lights are not on in [facility]. Pt went to room and now on bed

## 2020-08-28 NOTE — ED Provider Notes (Signed)
Dr. Toni Amend have spoken to both the patient and his mother today, and will be rescinding his IVC and recommends discharge to follow-up with his outpatient psychiatrist.  I also after discussing with the patient do not believe that he represents an immediate risk to himself or others.  He denies any desire to harm himself or others, to quite the contrary is very paranoid that radiation may injure him.  To me he seems to have some sort of fixed delusion or obsession over radiation.  He has an outpatient psychiatrist.  IVC rescinded by psychiatrist.     Sharyn Creamer, MD 08/28/20 [phone]

## 2020-08-28 NOTE — Telephone Encounter (Signed)
Patient's Mom called wanting information regarding his scheduled procedure for tomorrow.  Mom states that her son is in hospital now on Psychiatric unit and wants Dr Allegra Lai to go ahead with his procedure as scheduled. Please call Mom to advise

## 2020-08-29 ENCOUNTER — Emergency Department (HOSPITAL_COMMUNITY)
Admission: EM | Admit: 2020-08-29 | Discharge: 2020-08-30 | Disposition: A | Discharge status: Home / Self Care | Payer: Medicare Other | Attending: Emergency Medicine | Admitting: Emergency Medicine

## 2020-08-29 ENCOUNTER — Encounter (HOSPITAL_COMMUNITY): Payer: Self-pay

## 2020-08-29 ENCOUNTER — Ambulatory Visit: Admission: RE | Admit: 2020-08-29 | Payer: Medicaid Other | Source: Home / Self Care | Admitting: Gastroenterology

## 2020-08-29 ENCOUNTER — Encounter: Admission: RE | Payer: Self-pay | Source: Home / Self Care

## 2020-08-29 DIAGNOSIS — F451 Undifferentiated somatoform disorder: Secondary | ICD-10-CM | POA: Diagnosis present

## 2020-08-29 DIAGNOSIS — F22 Delusional disorders: Secondary | ICD-10-CM | POA: Insufficient documentation

## 2020-08-29 DIAGNOSIS — Z87891 Personal history of nicotine dependence: Secondary | ICD-10-CM | POA: Diagnosis not present

## 2020-08-29 DIAGNOSIS — R45851 Suicidal ideations: Secondary | ICD-10-CM | POA: Diagnosis not present

## 2020-08-29 DIAGNOSIS — F4325 Adjustment disorder with mixed disturbance of emotions and conduct: Secondary | ICD-10-CM | POA: Diagnosis not present

## 2020-08-29 DIAGNOSIS — F419 Anxiety disorder, unspecified: Secondary | ICD-10-CM | POA: Diagnosis not present

## 2020-08-29 DIAGNOSIS — Z046 Encounter for general psychiatric examination, requested by authority: Secondary | ICD-10-CM | POA: Diagnosis present

## 2020-08-29 DIAGNOSIS — F840 Autistic disorder: Secondary | ICD-10-CM | POA: Insufficient documentation

## 2020-08-29 DIAGNOSIS — F410 Panic disorder [episodic paroxysmal anxiety] without agoraphobia: Secondary | ICD-10-CM | POA: Diagnosis not present

## 2020-08-29 LAB — CBC
HCT: 49 % (ref 39.0–52.0)
Hemoglobin: 16 g/dL (ref 13.0–17.0)
MCH: 29 pg (ref 26.0–34.0)
MCHC: 32.7 g/dL (ref 30.0–36.0)
MCV: 88.8 fL (ref 80.0–100.0)
Platelets: 317 10*3/uL (ref 150–400)
RBC: 5.52 MIL/uL (ref 4.22–5.81)
RDW: 13.2 % (ref 11.5–15.5)
WBC: 5.1 10*3/uL (ref 4.0–10.5)
nRBC: 0 % (ref 0.0–0.2)

## 2020-08-29 LAB — RAPID URINE DRUG SCREEN, HOSP PERFORMED
Amphetamines: NOT DETECTED
Barbiturates: NOT DETECTED
Benzodiazepines: POSITIVE — AB
Cocaine: NOT DETECTED
Opiates: NOT DETECTED
Tetrahydrocannabinol: NOT DETECTED

## 2020-08-29 LAB — SALICYLATE LEVEL: Salicylate Lvl: <7 mg/dL — ABNORMAL LOW (ref 7.0–30.0)

## 2020-08-29 LAB — COMPREHENSIVE METABOLIC PANEL
ALT: 28 U/L (ref 0–44)
AST: 22 U/L (ref 15–41)
Albumin: 4.5 g/dL (ref 3.5–5.0)
Alkaline Phosphatase: 57 U/L (ref 38–126)
Anion gap: 11 (ref 5–15)
BUN: 9 mg/dL (ref 6–20)
CO2: 27 mmol/L (ref 22–32)
Calcium: 9.6 mg/dL (ref 8.9–10.3)
Chloride: 101 mmol/L (ref 98–111)
Creatinine, Ser: 0.97 mg/dL (ref 0.61–1.24)
GFR, Estimated: >60 mL/min (ref >60)
Glucose, Bld: 91 mg/dL (ref 70–99)
Potassium: 3.5 mmol/L (ref 3.5–5.1)
Sodium: 139 mmol/L (ref 135–145)
Total Bilirubin: 0.9 mg/dL (ref 0.3–1.2)
Total Protein: 7.4 g/dL (ref 6.5–8.1)

## 2020-08-29 LAB — CARBAMAZEPINE LEVEL, TOTAL: Carbamazepine Lvl: 2.6 ug/mL — ABNORMAL LOW (ref 4.0–12.0)

## 2020-08-29 LAB — ETHANOL: Alcohol, Ethyl (B): <10 mg/dL (ref <10)

## 2020-08-29 LAB — ACETAMINOPHEN LEVEL: Acetaminophen (Tylenol), Serum: <10 ug/mL — ABNORMAL LOW (ref 10–30)

## 2020-08-29 SURGERY — COLONOSCOPY WITH PROPOFOL
Anesthesia: General

## 2020-08-29 MED ORDER — LORAZEPAM 1 MG PO TABS
2.0000 mg | ORAL_TABLET | Freq: Once | ORAL | Status: AC
  Administered 2020-08-29: 2 mg via ORAL
  Filled 2020-08-29: qty 2

## 2020-08-29 MED ORDER — LORAZEPAM 1 MG PO TABS
2.0000 mg | ORAL_TABLET | Freq: Once | ORAL | Status: AC
  Administered 2020-08-30: 2 mg via ORAL
  Filled 2020-08-29 (×2): qty 2

## 2020-08-29 NOTE — ED Notes (Signed)
Spoke with [name], mom to go over medications.

## 2020-08-29 NOTE — ED Notes (Signed)
Contact mom for her side of whats going on [phone].

## 2020-08-29 NOTE — BH Assessment (Signed)
Dispositon:  Per Melbourne Abts, PA pt meets inpatient criteria. AC at [facility] contacted to see if appropriate bed available. Pts RN [name]) notified of disposition.   Weyman Pedro, MSW, LCSW Outpatient Therapist/Triage Specialist

## 2020-08-29 NOTE — BH Assessment (Addendum)
Dispositon:  Per Melbourne Abts, PA pt meets inpatient criteria. AC at [facility] contacted to see if appropriate bed available. Pts RN Connor [name]) notified of disposition.   Comprehensive Clinical Assessment (CCA) Note  08/29/2020 Connor [name] [id number]  Chief Complaint:  Chief Complaint  Patient presents with  . Suicidal   Visit Diagnosis:   Delusional Disorder  Flowsheet Row ED from 08/29/2020 in [facility] EMERGENCY DEPARTMENT ED from 08/28/2020 in [facility] from 08/25/2020 in [facility]  C-SSRS RISK CATEGORY Error: Q3, 4, or 5 should not be populated when Q2 is No No Risk No Risk     The patient demonstrates the following risk factors for suicide: Chronic risk factors for suicide include: psychiatric disorder of delusional disorder and chronic pain. Acute risk factors for suicide include: social withdrawal/isolation and recent discharge from multiple psychiatric clinical assessment/ED visits. Protective factors for this patient include: responsibility to others (children, family). Considering these factors, the overall suicide risk at this point appears to be low. Patient is not appropriate for outpatient follow up.  Connor [name] is a 20yo male presenting to [facility] for assessment of medical issues.  Connor [name] has previous diagnoses of ASD, ADHD, and Delusional Disorder. Pt states that he is under the psychiatric care of Dr. Daleen [name] at [facility].  Pt feels that he had a CT scan 52 days ago for an internal parasite, and feels that he was poisoned by the radiation from the CT scan. Pt reports that he is experiencing burning pain throughout his body, and has presented multiple times (per mother, 19 visits) to [facility] for similar assessments. Pt currently denies any SI or that he stated that he had any suicidal ideation to anyone (family members included).  Pts mother specifically stated to [facility] that  pt expressed that he wanted to stab himself in the neck with a knife.  Pt denies this. Pt also refuses to consent for TTS clinician to contact mother for collateral information.  Pt denies any HI, AVH, or paranoid ideation. Pt states that he is compliant with his medication regime and all psychiatric appointments. Pt stated multiple times throughout assessment "This is not a psychiatric issue--I need medical help".  Pt denies any substance use. Pt does not feel that he is a danger to himself or others at this time.  EDP is currently petitioning IVC for pt.  Connor [name], MSW, LCSW Outpatient Therapist/Triage Specialist    CCA Screening, Triage and Referral (STR)  Patient Reported Information How did you hear about [place]? Self  Referral name: self  Referral phone number: No data recorded  Whom do you see for routine medical problems? Primary Care  Practice/Facility Name: No data recorded Practice/Facility Phone Number: No data recorded Name of Contact: Connor [name] Number: [phone]  Contact Fax Number: No data recorded Prescriber Name: No data recorded Prescriber Address (if known): No data recorded  What Is the Reason for Your Visit/Call Today? Pt believes that he was exposed to radiation after a CT scan  How Long Has This Been Causing You Problems? 1-6 months  What Do You Feel Would Help You the Most Today? Assessment Only   Have You Recently Been in Any Inpatient Treatment (Hospital/Detox/Crisis Center/28-Day Program)? No  Name/Location of Program/Hospital:No data recorded How Long Were You There? No data recorded When Were You Discharged? No data recorded  Have You Ever Received Services From [facility] Before? Yes  Who Do You  See at [facility]? ED visits/Medical/Psychiatric   Have You Recently Had Any Thoughts About Hurting Yourself? Yes (pts mother stated that pt has suicidal thoughts--pt currently denying this)  Are You Planning to Commit  Suicide/Harm Yourself At This time? No   Have you Recently Had Thoughts About Hurting Someone Connor [name]? No  Explanation: No data recorded  Have You Used Any Alcohol or Drugs in the Past 24 Hours? No  How Long Ago Did You Use Drugs or Alcohol? No data recorded What Did You Use and How Much? No data recorded  Do You Currently Have a Therapist/Psychiatrist? Yes  Name of Therapist/Psychiatrist: Pt states that he sees Dr. Daleen [name] at [facility]   Have You Been Recently Discharged From Any Office Practice or Programs? No  Explanation of Discharge From Practice/Program: No data recorded    CCA Screening Triage Referral Assessment Type of Contact: Tele-Assessment  Is this Initial or Reassessment? Initial Assessment  Date Telepsych consult ordered in [facility]:  08/29/2020  Time Telepsych consult ordered in [facility]:  1329   Patient Reported Information Reviewed? Yes  Patient Left Without Being Seen? No data recorded Reason for Not Completing Assessment: No data recorded  Collateral Involvement: pt refusing for behavioral health clinician to contact mother for collateral information   Does Patient Have a Court Appointed Legal Guardian? No data recorded Name and Contact of Legal Guardian: No data recorded If Minor and Not Living with Parent(s), Who has Custody? n/a  Is CPS involved or ever been involved? Never  Is APS involved or ever been involved? Never   Patient Determined To Be At Risk for Harm To Self or Others Based on Review of Patient Reported Information or Presenting Complaint? Yes, for Self-Harm  Method: No data recorded Availability of Means: No data recorded Intent: No data recorded Notification Required: No data recorded Additional Information for Danger to Others Potential: No data recorded Additional Comments for Danger to Others Potential: No data recorded Are There Guns or Other Weapons in Your Home? No data recorded Types of Guns/Weapons: No data  recorded Are These Weapons Safely Secured?                            No data recorded Who Could Verify You Are Able To Have These Secured: No data recorded Do You Have any Outstanding Charges, Pending Court Dates, Parole/Probation? No data recorded Contacted To Inform of Risk of Harm To Self or Others: No data recorded  [facility] of Assessment: [facility] ED   Does Patient Present under Involuntary Commitment? Yes  IVC Papers Initial File Date: 08/29/2020   [place] of Residence: [place]   Patient Currently Receiving the Following Services: Medication Management   Determination of Need: Emergent (2 hours)   Options For Referral: Inpatient Hospitalization     CCA Biopsychosocial Intake/Chief Complaint:  Believe he was exposed to radiation and dying.  Current Symptoms/Problems: Believe he was exposed to radiation and dying.   Patient Reported Schizophrenia/Schizoaffective Diagnosis in Past: No   Strengths: Able to share what his needs are  Preferences: Express to inpatient  Abilities: Able to care for himself   Type of Services Patient Feels are Needed: pt requests medical work up "not anything psychiatric--I don't need any psychiatric help"   Initial Clinical Notes/Concerns: Patient paranoid and delusional.   Mental Health Symptoms Depression:  Hopelessness; Fatigue; Tearfulness   Duration of Depressive symptoms: Greater than two weeks   Mania:  None  Anxiety:   Difficulty concentrating; Restlessness; Sleep; Worrying; Tension   Psychosis:  Delusions   Duration of Psychotic symptoms: Less than six months   Trauma:  None   Obsessions:  Cause anxiety; Disrupts routine/functioning; Intrusive/time consuming; Recurrent & persistent thoughts/impulses/images; Poor insight   Compulsions:  Disrupts with routine/functioning; Absent insight/delusional; Poor Insight   Inattention:  None   Hyperactivity/Impulsivity:  N/A   Oppositional/Defiant Behaviors:  None    Emotional Irregularity:  None   Other Mood/Personality Symptoms:  No data recorded   Mental Status Exam Appearance and self-care  Stature:  Average   Weight:  Average weight   Clothing:  Neat/clean; Age-appropriate   Grooming:  Normal   Cosmetic use:  None   Posture/gait:  Normal   Motor activity:  Not Remarkable (Within normal range)   Sensorium  Attention:  Persistent ("there is nothing wrong, psychiatrically")   Concentration:  Preoccupied   Orientation:  X5   Recall/memory:  Normal   Affect and Mood  Affect:  Anxious   Mood:  Anxious; Hopeless   Relating  Eye contact:  Fleeting   Facial expression:  Responsive   Attitude toward examiner:  Cooperative; Guarded   Thought and Language  Speech flow: Normal; Clear and Coherent   Thought content:  Delusions; Ideas of Reference   Preoccupation:  Somatic   Hallucinations:  None   Organization:  No data recorded  [employer] of Knowledge:  Fair   Intelligence:  Below average   Abstraction:  Normal   Judgement:  Impaired   Reality Testing:  Distorted   Insight:  None/zero insight; Gaps   Decision Making:  Impulsive   Social Functioning  Social Maturity:  Irresponsible   Social Judgement:  Normal   Stress  Stressors:  Illness   Coping Ability:  Overwhelmed; Deficient supports   Skill Deficits:  None   Supports:  Family     Religion: Religion/Spirituality Are You A Religious Person?: No  Leisure/Recreation: Leisure / Recreation Do You Have Hobbies?: No  Exercise/Diet: Exercise/Diet Do You Exercise?: No Have You Gained or Lost A Significant Amount of Weight in the Past Six Months?: No Do You Follow a Special Diet?: No Do You Have Any Trouble Sleeping?: No Explanation of Sleeping Difficulties: Patient reports not sleeping for as long as he can remember "maybe an hour here and there"   CCA Employment/Education Employment/Work Situation: Employment / Work  Situation Employment situation: On disability Why is patient on disability: Mental Health Dx How long has patient been on disability: Unknown Patient's job has been impacted by current illness:  (Currently unemployed) What is the longest time patient has a held a job?: Unknown Where was the patient employed at that time?: Unknown Has patient ever been in the [employer]?: No  Education: Education Is Patient Currently Attending School?: No Last Grade Completed:  (Unknown) Name of High School: Unknown Did Garment/textile technologist From [employer]?:  (Unknown) Did You Attend College?:  (Unknown) Did You Attend Graduate School?:  (Unknown) Did You Have Any Special Interests In School?: Unknown Did You Have An Individualized Education Program (IIEP):  (Unknown) Did You Have Any Difficulty At School?:  (Unknown)   CCA Family/Childhood History Family and Relationship History: Family history Marital status: Single Are you sexually active?: No What is your sexual orientation?: Unknown Has your sexual activity been affected by drugs, alcohol, medication, or emotional stress?: Unknown Does patient have children?: No  Childhood History:  Childhood History By whom was/is the patient raised?:  Mother Additional childhood history information: Reports it was good Description of patient's relationship with caregiver when they were a child: States it was good Patient's description of current relationship with people who raised him/her: States it's good How were you disciplined when you got in trouble as a child/adolescent?: reports of no problems Does patient have siblings?:  (Unknown) Did patient suffer any verbal/emotional/physical/sexual abuse as a child?: No Did patient suffer from severe childhood neglect?: No Has patient ever been sexually abused/assaulted/raped as an adolescent or adult?: No Was the patient ever a victim of a crime or a disaster?: No Witnessed domestic violence?: No Has patient been  affected by domestic violence as an adult?: No  Child/Adolescent Assessment:     CCA Substance Use Alcohol/Drug Use: Alcohol / Drug Use Pain Medications: See PTA Prescriptions: See PTA Over the Counter: See PTA History of alcohol / drug use?: No history of alcohol / drug abuse Longest period of sobriety (when/how long): n/a     ASAM's:  Six Dimensions of Multidimensional Assessment  Dimension 1:  Acute Intoxication and/or Withdrawal Potential:      Dimension 2:  Biomedical Conditions and Complications:      Dimension 3:  Emotional, Behavioral, or Cognitive Conditions and Complications:     Dimension 4:  Readiness to Change:     Dimension 5:  Relapse, Continued use, or Continued Problem Potential:     Dimension 6:  Recovery/Living Environment:     ASAM Severity Score:    ASAM Recommended Level of Treatment:     Substance use Disorder (SUD)  none  Recommendations for Services/Supports/Treatments: Recommendations for Services/Supports/Treatments Recommendations For Services/Supports/Treatments: Inpatient Hospitalization  DSM5 Diagnoses: Patient Active Problem List   Diagnosis Date Noted  . Panic disorder 08/25/2020  . 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 PTSD (post-traumatic stress disorder) 07/11/2020  . Major depressive disorder, recurrent episode (HCC) 07/11/2020  . 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 ADHD (attention deficit hyperactivity disorder) 07/08/2017  . Personality disorder (HCC) 07/08/2017  . Insomnia 06/09/2017  . Aggression 01/31/2016  . 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 Autism spectrum disorder associated with known medical or genetic condition or environmental factor, requiring substantial support (level 2) 11/06/2015  . 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

## 2020-08-29 NOTE — ED Notes (Signed)
Pt wanded by security. 

## 2020-08-29 NOTE — ED Notes (Signed)
Pt yelling "I need help".  PA stopped and spoke to pt, ordered 2mg  ativan.  Pt refused to take stating "I need pain medication.... That does not absorb."  Provider aware.

## 2020-08-29 NOTE — ED Notes (Signed)
[facility].  Sitter is present with the patient.

## 2020-08-29 NOTE — ED Notes (Signed)
Pt changed into blue scrubs. Belongings w pts mother.

## 2020-08-29 NOTE — ED Provider Notes (Signed)
I was made aware of patient at shift change from [facility], [place].  In short, patient with ADHD, autism, and possible delusional disorder who has been seen by [facility] in behavioral health recently is concerned for "radiological disease" subsequent to previous CT scan.  The imaging was ordered because he was concerned about a parasite in his chest.  He is denying any SI or HI.  Given that patient is continuing to follow-up in the ER for similar delusional symptoms, we are hoping to reconsult with TTS given that he would likely benefit from psychiatric stabilization.  However, handoff provider did not feel as though IVC was warranted given that patient is here voluntarily and is only concerned about possible "radiological disease".  I was notified by the RN that he is becoming anxious due to commotion in the hall.     I spoke with patient and he wants to go home.  However, I then spoke with his mother, Alvis Lemmings, who reports that he has been arrested in the past for assaulting her.  She states that he has been to [facility] 19 times recently for same complaints.  She states that he is unstable and for the past few weeks has been claiming that he is going to kill himself by stabbing himself in the neck with a knife.  He even made that same reference this morning.  He refuses any psychiatric work-up and she is concerned about the safety of herself and him.  When I propositioned the idea of involuntary commitment given that he wants to leave, she stated that she thought that would be best for him given the circumstances.  We will complete IVC paperwork and first exam.  Carbamazepine level subtherapeutic.  Otherwise patient is medically cleared.      Lorelee New, PA-C 08/29/20 1720    Wynetta Fines, MD 09/01/20 1011

## 2020-08-29 NOTE — ED Triage Notes (Addendum)
Pt reports he is having pain all over . Pt reports he has a lot on his mind and a week ago he felt suicidal. Pt reports he tried to stab himself but changed his mind.   Please Contact mother if any questions.

## 2020-08-29 NOTE — ED Notes (Signed)
Pt stated that he did not say he was suicidal, he explained that he said he was exposed to radiation.

## 2020-08-29 NOTE — ED Provider Notes (Signed)
[facility] EMERGENCY DEPARTMENT Provider Note   CSN: [id number] Arrival date & time: 08/29/20  1329     History Chief Complaint  Patient presents with  . Suicidal    Connor [name] is a 20 y.o. male.  Patient with history of ADHD, autism, possible delusional disorder --presents the emergency department today for continued evaluation.  Patient was seen at [facility] yesterday and at behavioral health last night.  Patient states that he is concerned that he has a "radiological disease" stemming from a CT scan that he had 52 days ago.  Notes revealed that he had CT imaging due to concerns that he has a parasite in his chest.  Patient reports shortness of breath and generalized body pain.  He currently is denying suicidal ideations.  He is here trying to get help finding out what is going on with him and feel better.  Level V caveat 2/2 psychiatric disorder.         Past Medical History:  Diagnosis Date  . ADHD (attention deficit hyperactivity disorder)   . Autism   . Depressed   . Neurofibromatosis (HCC)    Type 1  . Obesity   . Pertussis    as a infant    Patient Active Problem List   Diagnosis Date Noted  . Panic disorder 08/25/2020  . 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 PTSD (post-traumatic stress disorder) 07/11/2020  . Major depressive disorder, recurrent episode (HCC) 07/11/2020  . 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 ADHD (attention deficit hyperactivity disorder) 07/08/2017  . Personality disorder (HCC) 07/08/2017  . Insomnia 06/09/2017  . Aggression 01/31/2016  . 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 Autism spectrum disorder associated with known medical or genetic condition or environmental factor, requiring substantial support (level 2) 11/06/2015  . 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 MRI    . RADIOLOGY WITH ANESTHESIA N/A 08/13/2012   Procedure: RADIOLOGY WITH ANESTHESIA;  Surgeon: Medication Radiologist, MD;  Location: [facility] OR;  Service: Radiology;  Laterality: N/A;  MRI        Family History  Problem Relation Age of Onset  . Anxiety disorder Mother   . Depression Mother   . OCD Mother   . Hypertension Mother   . Cancer Maternal Aunt   . Cancer Maternal Grandmother   . Hypertension Father   . Schizophrenia Maternal Uncle     Social History   Tobacco Use  . Smoking status: Former Smoker    Packs/day: 1.00    Years: 1.00    Pack years: 1.00    Types: Cigarettes    Quit date: 12/31/2014    Years since quitting: 5.6  . Smokeless tobacco: Never Used  Substance Use Topics  . Alcohol use: No    Alcohol/week: 0.0 standard drinks  . Drug use: No    Home Medications Prior to Admission medications   Medication Sig Start Date End Date Taking? Authorizing Provider  carbamazepine (TEGRETOL) 200 MG tablet Take 400 mg by mouth at bedtime.    [provider]  carbamazepine (TEGRETOL) 200 MG tablet Take 200 mg by mouth daily.    [provider]  desvenlafaxine (PRISTIQ) 100 MG 24 hr tablet Take 100 mg by mouth daily.    [provider]  gabapentin (NEURONTIN) 300 MG capsule Take 300 mg by mouth at bedtime. 08/02/20   [provider]  LORazepam (ATIVAN) 1 MG tablet Take 1 tablet (1 mg total) by mouth at bedtime. Patient taking differently: Take 1 mg by mouth daily as needed for anxiety. 08/22/20 08/22/21  Joni Reining, PA-C  Multiple Vitamins-Minerals (MULTIVITAMIN GUMMIES MENS) CHEW Chew 1 tablet by mouth daily.    [provider]  omeprazole (PRILOSEC) 40 MG capsule Take 1 capsule (40 mg total) by mouth 2 (two) times daily before a meal. 07/26/20 08/25/20  Vanga, Loel Dubonnet, MD  polyethylene glycol powder  (GLYCOLAX/MIRALAX) 17 GM/SCOOP powder Take 255 g by mouth daily. Patient taking differently: Take 1 Container by mouth daily as needed for severe constipation. 07/26/20   Toney Reil, MD  ziprasidone (GEODON) 80 MG capsule Take 80 mg by mouth at bedtime.    [provider]    Allergies    Patient has no known allergies.  Review of Systems   Review of Systems  Unable to perform ROS: Psychiatric disorder    Physical Exam Updated Vital Signs BP (!) 134/96 (BP Location: Right Arm)   Pulse 86   Temp 99.4 F (37.4 C)   Resp 16   SpO2 100%   Physical Exam Vitals and nursing note reviewed.  Constitutional:      Appearance: He is well-developed and well-nourished.  HENT:     Head: Normocephalic and atraumatic.  Eyes:     General:        Right eye: No discharge.        Left eye: No discharge.     Conjunctiva/sclera: Conjunctivae normal.  Cardiovascular:     Rate and Rhythm: Normal rate and regular rhythm.     Heart sounds: Normal heart sounds.  Pulmonary:     Effort: Pulmonary effort is normal.     Breath sounds: Normal breath sounds.  Abdominal:     Palpations: Abdomen is soft.     Tenderness: There is no abdominal tenderness.  Musculoskeletal:     Cervical back: Normal range of motion and neck supple.  Skin:    General: Skin is warm and dry.  Neurological:     Mental Status: He is alert.  Psychiatric:        Attention and Perception: Attention normal.        Mood and Affect: Mood is anxious.        Speech: Speech normal.        Behavior: Behavior is cooperative.        Thought Content: Thought content is paranoid and delusional. Thought content does not include homicidal or suicidal ideation.     ED Results / Procedures / Treatments   Labs (all labs ordered are listed, but only abnormal results are displayed) Labs Reviewed  SALICYLATE LEVEL - Abnormal; Notable for the following components:      Result Value   Salicylate Lvl <7.0 (*)    All other  components within normal limits  ACETAMINOPHEN LEVEL - Abnormal; Notable for the following components:   Acetaminophen (Tylenol), Serum <10 (*)    All other components within normal limits  RAPID URINE DRUG SCREEN, HOSP PERFORMED - Abnormal; Notable for the following components:   Benzodiazepines POSITIVE (*)    All other components within normal limits  COMPREHENSIVE METABOLIC PANEL  ETHANOL  CBC  CARBAMAZEPINE LEVEL, TOTAL    EKG None  Radiology No results found.  Procedures Procedures   Medications Ordered  in ED Medications - No data to display  ED Course  I have reviewed the triage vital signs and the nursing notes.  Pertinent labs & imaging results that were available during my care of the patient were reviewed by me and considered in my medical decision making (see chart for details).  Patient seen and examined.  Medical clearance labs ordered and will request psychiatric evaluation.  Discussed with Dr. Stevie Kern.  Vital signs reviewed and are as follows: BP (!) 134/96 (BP Location: Right Arm)   Pulse 86   Temp 99.4 F (37.4 C)   Resp 16   SpO2 100%    3:53 PM Signout to [employer] at shift change.   Pending medical clearance, awaiting tegretol level and TTS consult.     MDM Rules/Calculators/A&P                           Final Clinical Impression(s) / ED Diagnoses Final diagnoses:  Delusion [facility])    Rx / DC Orders ED Discharge Orders    None       Renne Crigler, PA-C 08/29/20 1555    Milagros Loll, MD 08/30/20 [phone]

## 2020-08-29 NOTE — ED Provider Notes (Signed)
[facility] Emergency Department Provider Note   ____________________________________________   Event Date/Time   First MD Initiated Contact with Patient 08/28/20 1937     (approximate)  I have reviewed the triage vital signs and the nursing notes.   HISTORY  Chief Complaint Psychiatric Evaluation    HPI Connor [name] is a 20 y.o. male with a past medical history of ADHD and autism who presents for the 18th time with concerns for a "radiologic disease".  Patient states that since having a CT scan last year he has been feeling as though there is a "parasite in my chest".  Patient has had these delusions for some time and has been evaluated by psychiatry multiple times for these symptoms.  Patient has no new complaints here today.  Patient currently denies any SI, HI, or AVH.         Past Medical History:  Diagnosis Date  . ADHD (attention deficit hyperactivity disorder)   . Autism   . Depressed   . Neurofibromatosis (HCC)    Type 1  . Obesity   . Pertussis    as a infant    Patient Active Problem List   Diagnosis Date Noted  . Panic disorder 08/25/2020  . 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 PTSD (post-traumatic stress disorder) 07/11/2020  . Major depressive disorder, recurrent episode (HCC) 07/11/2020  . 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 ADHD (attention deficit hyperactivity disorder) 07/08/2017  . Personality disorder (HCC) 07/08/2017  . Insomnia 06/09/2017  . Aggression 01/31/2016  . 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 Autism spectrum disorder associated with known medical or genetic condition or environmental factor, requiring substantial support (level 2) 11/06/2015  . 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 MRI    . RADIOLOGY WITH ANESTHESIA N/A 08/13/2012   Procedure: RADIOLOGY WITH ANESTHESIA;  Surgeon: Medication Radiologist, MD;  Location: [facility] OR;  Service: Radiology;  Laterality: N/A;  MRI     Prior to Admission medications   Medication Sig Start Date End Date Taking? Authorizing Provider  carbamazepine (TEGRETOL) 200 MG tablet Take 400 mg by mouth at bedtime.    [provider]  carbamazepine (TEGRETOL) 200 MG tablet Take 200 mg by mouth daily.    [provider]  desvenlafaxine (PRISTIQ) 100 MG 24 hr tablet Take 100 mg by mouth daily.    [provider]  gabapentin (NEURONTIN) 300 MG capsule Take 300 mg by mouth at bedtime. 08/02/20   [provider]  LORazepam (ATIVAN) 1 MG tablet Take 1 tablet (1 mg total) by mouth at bedtime. Patient taking differently: Take 1 mg by mouth daily as needed for anxiety. 08/22/20 08/22/21  Joni Reining, PA-C  Multiple Vitamins-Minerals (MULTIVITAMIN GUMMIES MENS) CHEW Chew 1 tablet by mouth daily.    [provider]  omeprazole (PRILOSEC) 40 MG capsule Take 1 capsule (40 mg total) by mouth 2 (two) times daily before a meal. 07/26/20 08/25/20  Vanga, Loel Dubonnet, MD  polyethylene glycol powder (GLYCOLAX/MIRALAX) 17 GM/SCOOP powder Take 255 g by mouth daily. Patient taking differently: Take 1 Container by mouth daily as needed for severe constipation. 07/26/20   Toney Reil, MD  ziprasidone (GEODON) 80 MG capsule Take 80 mg by mouth at bedtime.    [provider]    Allergies Patient has no known allergies.  Family History  Problem Relation Age of Onset  . Anxiety disorder Mother   . Depression Mother   . OCD Mother   . Hypertension Mother   . Cancer Maternal Aunt   . Cancer Maternal Grandmother   . Hypertension Father   . Schizophrenia Maternal Uncle     Social History Social History   Tobacco Use  . Smoking status: Former Smoker    Packs/day: 1.00     Years: 1.00    Pack years: 1.00    Types: Cigarettes    Quit date: 12/31/2014    Years since quitting: 5.6  . Smokeless tobacco: Never Used  Substance Use Topics  . Alcohol use: No    Alcohol/week: 0.0 standard drinks  . Drug use: No    Review of Systems Constitutional: No fever/chills Eyes: No visual changes. ENT: No sore throat. Cardiovascular: Endorses chest pain. Respiratory: Denies shortness of breath. Gastrointestinal: No abdominal pain.  No nausea, no vomiting.  No diarrhea. Genitourinary: Negative for dysuria. Musculoskeletal: Negative for acute arthralgias Skin: Negative for rash. Neurological: Negative for headaches, weakness/numbness/paresthesias in any extremity Psychiatric: Negative for suicidal ideation/homicidal ideation   ____________________________________________   PHYSICAL EXAM:  VITAL SIGNS: ED Triage Vitals  Enc Vitals Group     BP 08/28/20 1932 (!) 129/97     Pulse Rate 08/28/20 1932 (!) 111     Resp 08/28/20 1932 18     Temp 08/28/20 1932 98.3 F (36.8 C)     Temp Source 08/28/20 1932 Oral     SpO2 08/28/20 1932 97 %     Weight 08/28/20 1930 277 lb 12.5 oz (126 kg)     Height 08/28/20 1930 6\' 3"  (1.905 m)     Head Circumference --      Peak Flow --      Pain Score 08/28/20 1930 10     Pain Loc --      Pain Edu? --      Excl. in GC? --    Constitutional: Alert and oriented. Well appearing and in no acute distress. Eyes: Conjunctivae are normal. PERRL. Head: Atraumatic. Nose: No congestion/rhinnorhea. Mouth/Throat: Mucous membranes are moist. Neck: No stridor Cardiovascular: Grossly normal heart sounds.  Good peripheral circulation. Respiratory: Normal respiratory effort.  No retractions. Gastrointestinal: Soft and nontender. No distention. Musculoskeletal: No obvious deformities Neurologic:  Normal speech and language. No gross focal neurologic deficits are appreciated. Skin:  Skin is warm and dry. No rash noted. Psychiatric:  Cooperative.  Affect is flat. Speech and behavior are normal.  ____________________________________________   LABS (all labs ordered are listed, but only abnormal results are displayed)  Labs Reviewed - No data to display  PROCEDURES  Procedure(s) performed (including Critical Care):  Procedures   ____________________________________________   INITIAL IMPRESSION / ASSESSMENT AND PLAN / ED COURSE  As part of my medical decision making, I reviewed the following data within the electronic MEDICAL RECORD NUMBER Nursing notes reviewed and incorporated, Old chart reviewed, and Notes from prior ED visits reviewed and incorporated        Patient presents for delusions. Thoughts are organized. No history of prior suicide attempt, and no SI or HI at this time. Clinically w/ no overt toxidrome, low suspicion for ingestion given hx and exam Thoughts unlikely 2/2 anemia, hypothyroidism, infection, or ICH. Patients decision making capacity is compromised but they are still able to perform ADLs and have a caretaker to assist them  Consult: Psychiatry to evaluate patient for grave disability -After consultation, Dr. Toni Amend does not feel that patient requires inpatient admission for these benign delusions. Disposition: Discharge home with outpatient psychiatric follow-up      ____________________________________________   FINAL CLINICAL IMPRESSION(S) / ED DIAGNOSES  Final diagnoses:  Delusional disorder currently symptomatic [facility])     ED Discharge Orders    None       Note:  This document was prepared using Dragon voice recognition software and may include unintentional dictation errors.   Merwyn Katos, MD 08/29/20 [phone]

## 2020-08-29 NOTE — ED Notes (Signed)
Patient returned from [facility] from having a Telepsych visit.

## 2020-08-30 DIAGNOSIS — F22 Delusional disorders: Secondary | ICD-10-CM | POA: Diagnosis not present

## 2020-08-30 DIAGNOSIS — F451 Undifferentiated somatoform disorder: Secondary | ICD-10-CM

## 2020-08-30 MED ORDER — OLANZAPINE 10 MG PO TABS
10.0000 mg | ORAL_TABLET | Freq: Once | ORAL | Status: AC
  Administered 2020-08-30: 10 mg via ORAL
  Filled 2020-08-30: qty 1
  Filled 2020-08-30: qty 2

## 2020-08-30 MED ORDER — LORAZEPAM 1 MG PO TABS: 2.0000 mg | ORAL_TABLET | Freq: Once | ORAL | Status: DC

## 2020-08-30 NOTE — Discharge Instructions (Addendum)
Please be sure to schedule follow-up with your physician as an outpatient.  Take all medication as directed.  Return here for concerning changes in your condition.

## 2020-08-30 NOTE — ED Notes (Signed)
Pt is awake and pacing back and fourth to the restroom. Requesting to speak with the doctor.

## 2020-08-30 NOTE — ED Notes (Signed)
RN spoke with mother regarding patient being medically and  psy cleared; RN addressed mother's concern about patient needing in pt tx. This RN spoke with EDP who put in another  Consult for Psy to see patient; This RN called and spoke with Marylu Lund, NP who assessed patient this after noon. Psy is standing with decision that patient does not meet in pt criteria at this time and needs to be d/c; EDP  Concerned about d/c without mother picking him up due to Autism dx. Patient does not have legal guardian at this time and able to make his own decisions; pt is states he is not HI/SI at this time and is pacing the room; pt states he needs medical care not psy care; RN will call mother back to let her know the decision of psy to [facility]

## 2020-08-30 NOTE — ED Notes (Signed)
Patient walked out to car were mother was waiting by sitter and [employer] at mother's request-Monique,RN

## 2020-08-30 NOTE — ED Provider Notes (Signed)
Emergency Medicine Observation Re-evaluation Note  Connor [name] is a 20 y.o. male, seen on rounds today.  Pt initially presented to the ED for complaints of Suicidal Currently, the patient is resting.  Physical Exam  BP (!) 144/99 (BP Location: Right Arm)   Pulse 80   Temp 98 F (36.7 C) (Oral)   Resp 16   Ht 6\' 3"  (1.905 m)   Wt 123.8 kg   SpO2 99%   BMI 34.11 kg/m  Physical Exam General: calm Cardiac: warm and well perfused Lungs: even and unlabored3 Psych: calm  ED Course / MDM  EKG:    I have reviewed the labs performed to date as well as medications administered while in observation.  Recent changes in the last 24 hours include psych recommending in pt.  Plan  Current plan is for in pt per psych. Patient is not under full IVC at this time.   , MD 08/30/20 [phone]

## 2020-08-30 NOTE — ED Notes (Addendum)
RN spoke with mother, DAWN. Mother stated she has been to 27 different EDs around the state and not one has helped her son. Mother states she does not know what to do and she will not take her son back home. Mother was very tearful and expressed that she really needs help taking care of her son or else he will die. RN told mother she will keep her updated with any new information.

## 2020-08-30 NOTE — ED Notes (Signed)
Patient's mother was understanding of decision and has agreed to pick patient up. EDP has been notified-Monique,RN

## 2020-08-30 NOTE — ED Provider Notes (Signed)
2:55 PM Patient designated as appropriate for discharge by our behavioral health colleagues.   Gerhard Munch, MD 08/30/20 1455

## 2020-08-30 NOTE — Consult Note (Signed)
Telepsych Consultation   Location of Patient: [facility] Location of Provider: [facility]  Patient Identification: Connor [name] MRN:  [id number] Principal Diagnosis: Somatic symptom disorder Diagnosis:  Principal Problem:   Somatic symptom disorder   Total Time spent with patient: 30 minutes  HPI:  Reassessment: Patient seen via telepsych. Chart reviewed. Connor [name] is a 20 year old male with history of ASD, ADHD, and delusional disorder. He presented to [facility] yesterday reporting pain as well as chronic delusion that he has a "radiological disease" from a CT scan 52 days ago. Of note, he was seen by psychiatry at [facility] ED on 08/28/20 for 2 separate visits for same complaint and was cleared for discharge by psychiatry both times.   On assessment today, patient is calm, cooperative, with well-organized thoughts and speech. He states that he presented to the hospital for medical complaints. He continues to report symptoms from radiation from CT scan- sensitivity to lights and GI issues. He appears calm on assessment but reports his physical symptoms are causing him anxiety. He strongly denies any SI or thoughts of self-harm. He does admit to making a suicidal comment to his mother yesterday but states this comment was made out of frustration with his physical symptoms and strongly denies ever having any true desire to hurt or kill himself. He denies any HI/AVH. He shows no signs of responding to internal stimuli.   He expresses frustration with being seen by psychiatry when he presented for medical complaints. He is requesting discharge home. Per review of chart notes, he requested to be discharged from [facility] ED two days ago for outpatient GI appointment. He states he did not go to this appointment and is also missing an appointment with his outpatient psychiatrist today due to being in the ED. I discussed with patient my recommendation for follow up with outpatient services.  Patient does express insight that ED visits have been unhelpful and expresses agreement to follow up with his outpatient psychiatrist as well as with outpatient medical appointments for his reported medical symptoms. He reports having current psychotropic medications at home and denies need for rx.  I requested to speak with patient's mother, but patient refused to provide consent. He continues to assure me that suicidal comments he made to his mother were made out of anger and strongly denies any SI and contracts for safety. Per previous collateral note from his mother, she expressed concern because patient had been aggressive toward her in the past. I do see documentation in the chart of aggressive behaviors in 2019, but I do not see any evidence of recent aggressive behaviors. At this time, patient is calm and cooperative, denying any thoughts of hurting others.  Per TTS assessment 08/29/20: Connor [name] is a 20yo male presenting to [facility] for assessment of medical issues.  Connor [name] has previous diagnoses of ASD, ADHD, and Delusional Disorder. Pt states that he is under the psychiatric care of Dr. Daleen Squibb at [facility].  Pt feels that he had a CT scan 52 days ago for an internal parasite, and feels that he was poisoned by the radiation from the CT scan. Pt reports that he is experiencing burning pain throughout his body, and has presented multiple times (per mother, 19 visits) to [facility] for similar assessments. Pt currently denies any SI or that he stated that he had any suicidal ideation to anyone (family members included).  Pts mother specifically stated to [facility] that pt expressed that he wanted to stab himself in the neck with  a knife.  Pt denies this. Pt also refuses to consent for TTS clinician to contact mother for collateral information.  Pt denies any HI, AVH, or paranoid ideation. Pt states that he is compliant with his medication regime and all psychiatric appointments. Pt stated multiple times throughout  assessment "This is not a psychiatric issue--I need medical help".  Pt denies any substance use. Pt does not feel that he is a danger to himself or others at this time.  EDP is currently petitioning IVC for pt.  Disposition: Patient with continued somatic preoccupations but shows no evidence of risk of harm to self or others and is psych cleared for discharge. He is agreeable to continued outpatient follow up with his psychiatrist, continuing home psychiatric medications and following up outpatient for medical concerns. ED staff updated.  Past Psychiatric History: See above  Risk to Self:   Risk to Others:   Prior Inpatient Therapy:   Prior Outpatient Therapy:    Past Medical History:  Past Medical History:  Diagnosis Date  . ADHD (attention deficit hyperactivity disorder)   . Autism   . Depressed   . Neurofibromatosis (HCC)    Type 1  . Obesity   . Pertussis    as a infant    Past Surgical History:  Procedure Laterality Date  . MRI    . RADIOLOGY WITH ANESTHESIA N/A 08/13/2012   Procedure: RADIOLOGY WITH ANESTHESIA;  Surgeon: Medication Radiologist, MD;  Location: [facility] OR;  Service: Radiology;  Laterality: N/A;  MRI    Family History:  Family History  Problem Relation Age of Onset  . Anxiety disorder Mother   . Depression Mother   . OCD Mother   . Hypertension Mother   . Cancer Maternal Aunt   . Cancer Maternal Grandmother   . Hypertension Father   . Schizophrenia Maternal Uncle    Family Psychiatric  History: See above Social History:  Social History   Substance and Sexual Activity  Alcohol Use No  . Alcohol/week: 0.0 standard drinks     Social History   Substance and Sexual Activity  Drug Use No    Social History   Socioeconomic History  . Marital status: Single    Spouse name: Not on file  . Number of children: Not on file  . Years of education: Not on file  . Highest education level: Not on file  Occupational History  . Not on file  Tobacco Use  .  Smoking status: Former Smoker    Packs/day: 1.00    Years: 1.00    Pack years: 1.00    Types: Cigarettes    Quit date: 12/31/2014    Years since quitting: 5.6  . Smokeless tobacco: Never Used  Substance and Sexual Activity  . Alcohol use: No    Alcohol/week: 0.0 standard drinks  . Drug use: No  . Sexual activity: Not Currently    Birth control/protection: None  Other Topics Concern  . Not on file  Social History Narrative   [name] is a high school drop out.   He lives with his mom only. He has one sister.   He enjoys eating, sleeping, and watching tv.   Social Determinants of Health   Financial Resource Strain: Not on file  Food Insecurity: Not on file  Transportation Needs: Not on file  Physical Activity: Not on file  Stress: Not on file  Social Connections: Not on file   Additional Social History:    Allergies:  No Known Allergies  Labs:  Results for orders placed or performed during the hospital encounter of 08/29/20 (from the past 48 hour(s))  Rapid urine drug screen (hospital performed)     Status: Abnormal   Collection Time: 08/29/20  1:54 PM  Result Value Ref Range   Opiates NONE DETECTED NONE DETECTED   Cocaine NONE DETECTED NONE DETECTED   Benzodiazepines POSITIVE (A) NONE DETECTED   Amphetamines NONE DETECTED NONE DETECTED   Tetrahydrocannabinol NONE DETECTED NONE DETECTED   Barbiturates NONE DETECTED NONE DETECTED    Comment: (NOTE) DRUG SCREEN FOR MEDICAL PURPOSES ONLY.  IF CONFIRMATION IS NEEDED FOR ANY PURPOSE, NOTIFY LAB WITHIN 5 DAYS.  LOWEST DETECTABLE LIMITS FOR URINE DRUG SCREEN Drug Class                     Cutoff (ng/mL) Amphetamine and metabolites    1000 Barbiturate and metabolites    200 Benzodiazepine                 200 Tricyclics and metabolites     300 Opiates and metabolites        300 Cocaine and metabolites        300 THC                            50 Performed at [facility] Lab, [address], [place],  [place] [address]   Comprehensive metabolic panel     Status: None   Collection Time: 08/29/20  2:01 PM  Result Value Ref Range   Sodium 139 135 - 145 mmol/L   Potassium 3.5 3.5 - 5.1 mmol/L   Chloride 101 98 - 111 mmol/L   CO2 27 22 - 32 mmol/L   Glucose, Bld 91 70 - 99 mg/dL    Comment: Glucose reference range applies only to samples taken after fasting for at least 8 hours.   BUN 9 6 - 20 mg/dL   Creatinine, Ser 8.25 0.61 - 1.24 mg/dL   Calcium 9.6 8.9 - 05.3 mg/dL   Total Protein 7.4 6.5 - 8.1 g/dL   Albumin 4.5 3.5 - 5.0 g/dL   AST 22 15 - 41 U/L   ALT 28 0 - 44 U/L   Alkaline Phosphatase 57 38 - 126 U/L   Total Bilirubin 0.9 0.3 - 1.2 mg/dL   GFR, Estimated >97 >67 mL/min    Comment: (NOTE) Calculated using the CKD-EPI Creatinine Equation (2021)    Anion gap 11 5 - 15    Comment: Performed at [facility], [address] [place], [place] [address]  Ethanol     Status: None   Collection Time: 08/29/20  2:01 PM  Result Value Ref Range   Alcohol, Ethyl (B) <10 <10 mg/dL    Comment: (NOTE) Lowest detectable limit for serum alcohol is 10 mg/dL.  For medical purposes only. Performed at [facility], [address] [place], [place] [address]   Salicylate level     Status: Abnormal   Collection Time: 08/29/20  2:01 PM  Result Value Ref Range   Salicylate Lvl <7.0 (L) 7.0 - 30.0 mg/dL    Comment: Performed at [facility], [address], [place], [place] [address]  Acetaminophen level     Status: Abnormal   Collection Time: 08/29/20  2:01 PM  Result Value Ref Range   Acetaminophen (Tylenol), Serum <10 (L) 10 - 30 ug/mL  Comment: (NOTE) Therapeutic concentrations vary significantly. A range of 10-30 ug/mL  may be an effective concentration for many patients. However, some  are best treated at concentrations outside of this range. Acetaminophen concentrations >150 ug/mL at 4 hours after ingestion  and >50 ug/mL at 12 hours after ingestion are often  associated with  toxic reactions.  Performed at [facility], [address] [place], [place] [address]   cbc     Status: None   Collection Time: 08/29/20  2:01 PM  Result Value Ref Range   WBC 5.1 4.0 - 10.5 K/uL   RBC 5.52 4.22 - 5.81 MIL/uL   Hemoglobin 16.0 13.0 - 17.0 g/dL   HCT 57.8 46.9 - 62.9 %   MCV 88.8 80.0 - 100.0 fL   MCH 29.0 26.0 - 34.0 pg   MCHC 32.7 30.0 - 36.0 g/dL   RDW 52.8 41.3 - 24.4 %   Platelets 317 150 - 400 K/uL   nRBC 0.0 0.0 - 0.2 %    Comment: Performed at [facility], [address], [place], [place] [address]  Carbamazepine level, total     Status: Abnormal   Collection Time: 08/29/20  2:24 PM  Result Value Ref Range   Carbamazepine Lvl 2.6 (L) 4.0 - 12.0 ug/mL    Comment: Performed at [facility], [address] [place], [place] [address]    Medications:  No current facility-administered medications for this encounter.   Current Outpatient Medications  Medication Sig Dispense Refill  . carbamazepine (TEGRETOL) 200 MG tablet Take 400 mg by mouth at bedtime.    . carbamazepine (TEGRETOL) 200 MG tablet Take 200 mg by mouth daily.    Marland Kitchen desvenlafaxine (PRISTIQ) 100 MG 24 hr tablet Take 100 mg by mouth daily.    Marland Kitchen gabapentin (NEURONTIN) 300 MG capsule Take 300 mg by mouth at bedtime.    Marland Kitchen LORazepam (ATIVAN) 1 MG tablet Take 1 tablet (1 mg total) by mouth at bedtime. (Patient taking differently: Take 1 mg by mouth daily as needed for anxiety.) 10 tablet 0  . Multiple Vitamins-Minerals (MULTIVITAMIN GUMMIES MENS) CHEW Chew 1 tablet by mouth daily.    Marland Kitchen omeprazole (PRILOSEC) 40 MG capsule Take 1 capsule (40 mg total) by mouth 2 (two) times daily before a meal. 60 capsule 0  . polyethylene glycol powder (GLYCOLAX/MIRALAX) 17 GM/SCOOP powder Take 255 g by mouth daily. (Patient taking differently: Take 1 Container by mouth daily as needed for severe constipation.) 255 g 3  . sucralfate (CARAFATE) 1 GM/10ML suspension Take 10 mLs by  mouth 4 (four) times daily as needed (stomach).    . ziprasidone (GEODON) 80 MG capsule Take 80 mg by mouth at bedtime.      Psychiatric Specialty Exam: Physical Exam  Review of Systems  Blood pressure (!) 143/100, pulse (!) 121, temperature 98.7 F (37.1 C), temperature source Oral, resp. rate 18, height 6\' 3"  (1.905 m), weight 123.8 kg, SpO2 95 %.Body mass index is 34.11 kg/m.  General Appearance: Casual  Eye Contact:  Good  Speech:  Clear and Coherent and Normal Rate  Volume:  Normal  Mood:  Anxious  Affect:  Constricted  Thought Process:  Coherent and Goal Directed  Orientation:  Full (Time, Place, and Person)  Thought Content:  Delusions (somatic preoccupation)  Suicidal Thoughts:  No  Homicidal Thoughts:  No  Memory:  Immediate;   Good Recent;   Good Remote;   Good  Judgement:  Poor  Insight:  Lacking  Psychomotor Activity:  Normal  Concentration:  Concentration: Fair and Attention Span: Fair  Recall:  Good  Fund of Knowledge:  Fair  Language:  Good  Akathisia:  No  Handed:  Right  AIMS (if indicated):     Assets:  Communication Skills Desire for Improvement Financial Resources/Insurance Housing Leisure Time Social Support  ADL's:  Intact  Cognition:  WNL  Sleep:       Disposition: Patient with continued somatic preoccupations but shows no evidence of risk of harm to self or others and is psych cleared for discharge. He is agreeable to continued outpatient follow up with his psychiatrist, continuing home psychiatric medications and following up outpatient for medical concerns. ED staff updated.  This service was provided via telemedicine using a 2-way, interactive audio and video technology with the identified patient and this Clinical research associate.  Aldean Baker, NP 08/30/2020 2:36 PM

## 2020-08-30 NOTE — ED Notes (Signed)
Pt wanting to speak with provider, told pt provider will see him once he is available.   Pt keeps on standing in the hallway. Redirected pt back to bed at this time.

## 2020-08-30 NOTE — ED Notes (Signed)
Pt states he has been exposed to radiation and that he is about to scream his head off

## 2020-08-30 NOTE — ED Provider Notes (Signed)
Nursing staff reports that the mother arrived to take the patient home.  Mother the patient apparently felt uncomfortable taking her son home.  Son was returned to his room.  TTS will be reconsulted.   Wynetta Fines, MD 08/30/20 [phone]

## 2020-08-30 NOTE — ED Notes (Signed)
RN spoke with SW regarding need to have APS consult if mother still refuses to pick patient up after she receive information that patient is still psy cleared; TOC consult will need to be placed at that time if needed; RN advised EDP of Psy's decision of dispo-Monique,RN

## 2020-08-30 NOTE — ED Notes (Signed)
Tele psych machine to bedside  

## 2020-09-01 ENCOUNTER — Emergency Department (HOSPITAL_COMMUNITY)
Admission: EM | Admit: 2020-09-01 | Discharge: 2020-09-01 | Disposition: A | Discharge status: Home / Self Care | Payer: No Typology Code available for payment source | Attending: Emergency Medicine | Admitting: Emergency Medicine

## 2020-09-01 ENCOUNTER — Encounter (HOSPITAL_COMMUNITY): Payer: Self-pay

## 2020-09-01 DIAGNOSIS — F840 Autistic disorder: Secondary | ICD-10-CM | POA: Insufficient documentation

## 2020-09-01 DIAGNOSIS — F22 Delusional disorders: Secondary | ICD-10-CM | POA: Diagnosis not present

## 2020-09-01 DIAGNOSIS — Z79899 Other long term (current) drug therapy: Secondary | ICD-10-CM | POA: Insufficient documentation

## 2020-09-01 DIAGNOSIS — Z87891 Personal history of nicotine dependence: Secondary | ICD-10-CM | POA: Diagnosis not present

## 2020-09-01 LAB — COMPREHENSIVE METABOLIC PANEL
ALT: 16 U/L (ref 0–44)
AST: 19 U/L (ref 15–41)
Albumin: 3.2 g/dL — ABNORMAL LOW (ref 3.5–5.0)
Alkaline Phosphatase: 40 U/L (ref 38–126)
Anion gap: 10 (ref 5–15)
BUN: 11 mg/dL (ref 6–20)
CO2: 20 mmol/L — ABNORMAL LOW (ref 22–32)
Calcium: 6.5 mg/dL — ABNORMAL LOW (ref 8.9–10.3)
Chloride: 110 mmol/L (ref 98–111)
Creatinine, Ser: 0.65 mg/dL (ref 0.61–1.24)
GFR, Estimated: >60 mL/min (ref >60)
Glucose, Bld: 58 mg/dL — ABNORMAL LOW (ref 70–99)
Potassium: 3.9 mmol/L (ref 3.5–5.1)
Sodium: 140 mmol/L (ref 135–145)
Total Bilirubin: 1 mg/dL (ref 0.3–1.2)
Total Protein: 5.3 g/dL — ABNORMAL LOW (ref 6.5–8.1)

## 2020-09-01 LAB — URINALYSIS, ROUTINE W REFLEX MICROSCOPIC
Bacteria, UA: NONE SEEN
Bilirubin Urine: NEGATIVE
Glucose, UA: NEGATIVE mg/dL
Hgb urine dipstick: NEGATIVE
Ketones, ur: 80 mg/dL — AB
Leukocytes,Ua: NEGATIVE
Nitrite: NEGATIVE
Protein, ur: 30 mg/dL — AB
Specific Gravity, Urine: 1.029 (ref 1.005–1.030)
pH: 5 (ref 5.0–8.0)

## 2020-09-01 LAB — CBC WITH DIFFERENTIAL/PLATELET
Abs Immature Granulocytes: 0.03 10*3/uL (ref 0.00–0.07)
Basophils Absolute: 0 10*3/uL (ref 0.0–0.1)
Basophils Relative: 0 %
Eosinophils Absolute: 0.1 10*3/uL (ref 0.0–0.5)
Eosinophils Relative: 1 %
HCT: 50.9 % (ref 39.0–52.0)
Hemoglobin: 16.6 g/dL (ref 13.0–17.0)
Immature Granulocytes: 0 %
Lymphocytes Relative: 6 %
Lymphs Abs: 0.5 10*3/uL — ABNORMAL LOW (ref 0.7–4.0)
MCH: 29.7 pg (ref 26.0–34.0)
MCHC: 32.6 g/dL (ref 30.0–36.0)
MCV: 91.1 fL (ref 80.0–100.0)
Monocytes Absolute: 0.4 10*3/uL (ref 0.1–1.0)
Monocytes Relative: 4 %
Neutro Abs: 7.9 10*3/uL — ABNORMAL HIGH (ref 1.7–7.7)
Neutrophils Relative %: 89 %
Platelets: 287 10*3/uL (ref 150–400)
RBC: 5.59 MIL/uL (ref 4.22–5.81)
RDW: 13.2 % (ref 11.5–15.5)
WBC: 8.9 10*3/uL (ref 4.0–10.5)
nRBC: 0 % (ref 0.0–0.2)

## 2020-09-01 LAB — SALICYLATE LEVEL: Salicylate Lvl: <7 mg/dL — ABNORMAL LOW (ref 7.0–30.0)

## 2020-09-01 LAB — ETHANOL: Alcohol, Ethyl (B): <10 mg/dL (ref <10)

## 2020-09-01 LAB — RAPID URINE DRUG SCREEN, HOSP PERFORMED
Amphetamines: NOT DETECTED
Barbiturates: NOT DETECTED
Benzodiazepines: POSITIVE — AB
Cocaine: NOT DETECTED
Opiates: NOT DETECTED
Tetrahydrocannabinol: NOT DETECTED

## 2020-09-01 LAB — CBG MONITORING, ED
Glucose-Capillary: 63 mg/dL — ABNORMAL LOW (ref 70–99)
Glucose-Capillary: 176 mg/dL — ABNORMAL HIGH (ref 70–99)

## 2020-09-01 LAB — ACETAMINOPHEN LEVEL: Acetaminophen (Tylenol), Serum: <10 ug/mL — ABNORMAL LOW (ref 10–30)

## 2020-09-01 MED ORDER — SODIUM CHLORIDE 0.9 % IV BOLUS
500.0000 mL | Freq: Once | INTRAVENOUS | Status: AC
  Administered 2020-09-01: 500 mL via INTRAVENOUS

## 2020-09-01 MED ORDER — DEXTROSE 50 % IV SOLN
1.0000 | Freq: Once | INTRAVENOUS | Status: DC
  Filled 2020-09-01: qty 50

## 2020-09-01 MED ORDER — GLUCOSE 40 % PO GEL
1.0000 | Freq: Once | ORAL | Status: AC
  Administered 2020-09-01: 37.5 g via ORAL
  Filled 2020-09-01: qty 1

## 2020-09-01 NOTE — ED Notes (Signed)
Pt removed IV from left hand and all monitoring equipment. Pt states "It's getting on my nerves so I took it off"

## 2020-09-01 NOTE — ED Provider Notes (Signed)
[facility]-EMERGENCY DEPT Provider Note   CSN: [id number] Arrival date & time: 09/01/20  1203     History Chief Complaint  Patient presents with  . Hallucinations  . Psychiatric Evaluation  . Delusional    Connor [name] is a 20 y.o. male past history of ADHD, autism, depression who presents for evaluation of concerns of "radiation exposure."  He reports that about 55 days ago, he had a CT scan.  He states that this caused him to be exposed to radiation he now believes he is being poisoned by radiation exposure and that is it is affecting his whole body.  He states that he feels like his whole body is burning and that he is being "dried out from the inside out."  He is concerned that he may be getting sick from this and thinks he may need therapy for this.  He states that he feels weak and tired all the time.  He has been seen by several ED's for evaluation of the symptoms.  He states that "nothing is happening and it keeps getting missed."  He denies any SI, HI, auditory or visual hallucinations.  The history is provided by the patient.       Past Medical History:  Diagnosis Date  . ADHD (attention deficit hyperactivity disorder)   . Autism   . Depressed   . Neurofibromatosis (HCC)    Type 1  . Obesity   . Pertussis    as a infant    Patient Active Problem List   Diagnosis Date Noted  . Panic disorder 08/25/2020  . 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 PTSD (post-traumatic stress disorder) 07/11/2020  . Major depressive disorder, recurrent episode (HCC) 07/11/2020  . 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 ADHD (attention deficit hyperactivity disorder) 07/08/2017  . Personality disorder (HCC) 07/08/2017  . Insomnia 06/09/2017  . Aggression 01/31/2016  . 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 Autism spectrum disorder associated with known medical or  genetic condition or environmental factor, requiring substantial support (level 2) 11/06/2015  . 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 MRI    . RADIOLOGY WITH ANESTHESIA N/A 08/13/2012   Procedure: RADIOLOGY WITH ANESTHESIA;  Surgeon: Medication Radiologist, MD;  Location: [facility] OR;  Service: Radiology;  Laterality: N/A;  MRI        Family History  Problem Relation Age of Onset  . Anxiety disorder Mother   . Depression Mother   . OCD Mother   . Hypertension Mother   . Cancer Maternal Aunt   . Cancer Maternal Grandmother   . Hypertension Father   . Schizophrenia Maternal Uncle     Social History   Tobacco Use  . Smoking status: Former Smoker    Packs/day: 1.00    Years: 1.00    Pack years: 1.00    Types: Cigarettes    Quit date: 12/31/2014    Years since quitting: 5.6  . Smokeless tobacco: Never Used  Substance Use Topics  . Alcohol use: No    Alcohol/week: 0.0 standard drinks  . Drug use: No    Home Medications Prior to Admission medications   Medication Sig Start Date End Date Taking? Authorizing Provider  carbamazepine (TEGRETOL) 200 MG tablet Take 400 mg by mouth at bedtime.   Yes  [provider]  carbamazepine (TEGRETOL) 200 MG tablet Take 200 mg by mouth daily.   Yes [provider]  desvenlafaxine (PRISTIQ) 100 MG 24 hr tablet Take 100 mg by mouth daily.   Yes [provider]  gabapentin (NEURONTIN) 300 MG capsule Take 300 mg by mouth at bedtime. 08/02/20  Yes [provider]  LORazepam (ATIVAN) 1 MG tablet Take 1 tablet (1 mg total) by mouth at bedtime. Patient taking differently: Take 1 mg by mouth daily as needed for anxiety. 08/22/20 08/22/21 Yes Joni Reining, PA-C  Multiple Vitamins-Minerals (MULTIVITAMIN GUMMIES MENS) CHEW Chew 1 tablet by mouth daily.   Yes [provider]  polyethylene glycol powder (GLYCOLAX/MIRALAX) 17 GM/SCOOP powder Take 255 g by mouth daily. Patient taking differently: Take 1 Container by mouth daily as needed for severe constipation. 07/26/20  Yes Vanga, Loel Dubonnet, MD  sucralfate (CARAFATE) 1 GM/10ML suspension Take 10 mLs by mouth 4 (four) times daily as needed (stomach). 08/25/20  Yes [provider]  triamcinolone (KENALOG) 0.1 % Apply 1 application topically 3 (three) times daily as needed (skin irritation or burns). 08/31/20  Yes [provider]  ziprasidone (GEODON) 80 MG capsule Take 80 mg by mouth at bedtime.   Yes [provider]  omeprazole (PRILOSEC) 40 MG capsule Take 1 capsule (40 mg total) by mouth 2 (two) times daily before a meal. 07/26/20 08/25/20  Toney Reil, MD    Allergies    Patient has no known allergies.  Review of Systems   Review of Systems  Constitutional: Positive for fatigue. Negative for fever.  Respiratory: Negative for cough and shortness of breath.   Cardiovascular: Negative for chest pain.  Gastrointestinal: Negative for abdominal pain, nausea and vomiting.  Genitourinary: Negative for dysuria and hematuria.  Neurological: Positive for weakness (generalized). Negative for headaches.  Psychiatric/Behavioral: Negative for suicidal ideas.  All other systems reviewed and are negative.   Physical Exam Updated Vital Signs BP 119/85   Pulse (!) 110   Temp 98.8 F (37.1 C)   Resp 18   Ht 6\' 3"  (1.905 m)   Wt 123.8 kg   SpO2 100%   BMI 34.11 kg/m   Physical Exam Vitals and nursing note reviewed.  Constitutional:      Appearance: Normal appearance. He is well-developed.  HENT:     Head: Normocephalic and atraumatic.  Eyes:     General: Lids are normal.     Conjunctiva/sclera: Conjunctivae normal.     Pupils: Pupils are equal, round, and reactive to light.  Neck:     Comments: Neck is supple and without rigidity Cardiovascular:     Rate and Rhythm: Normal  rate and regular rhythm.     Pulses: Normal pulses.     Heart sounds: Normal heart sounds. No murmur heard. No friction rub. No gallop.   Pulmonary:     Effort: Pulmonary effort is normal.     Breath sounds: Normal breath sounds.     Comments: Lungs clear to auscultation bilaterally.  Symmetric chest rise.  No wheezing, rales, rhonchi. Abdominal:     Palpations: Abdomen is soft. Abdomen is not rigid.     Tenderness: There is no abdominal tenderness. There is no guarding.     Comments: Abdomen is soft, non-distended, non-tender. No rigidity, No guarding. No peritoneal signs.  Musculoskeletal:        General: Normal range of motion.     Cervical back: Full passive range of motion without  pain.  Skin:    General: Skin is warm and dry.     Capillary Refill: Capillary refill takes less than 2 seconds.     Comments: No rash  Neurological:     Mental Status: He is alert and oriented to person, place, and time.     Comments: Alert and oriented x 3 Follows commands, Moves all extremities  5/5 strength to BUE and BLE  Sensation intact throughout all major nerve distributions  Psychiatric:        Speech: Speech normal.        Thought Content: Thought content is delusional. Thought content does not include homicidal or suicidal ideation. Thought content does not include homicidal or suicidal plan.     ED Results / Procedures / Treatments   Labs (all labs ordered are listed, but only abnormal results are displayed) Labs Reviewed  CBC WITH DIFFERENTIAL/PLATELET - Abnormal; Notable for the following components:      Result Value   Neutro Abs 7.9 (*)    Lymphs Abs 0.5 (*)    All other components within normal limits  RAPID URINE DRUG SCREEN, HOSP PERFORMED - Abnormal; Notable for the following components:   Benzodiazepines POSITIVE (*)    All other components within normal limits  COMPREHENSIVE METABOLIC PANEL - Abnormal; Notable for the following components:   CO2 20 (*)    Glucose, Bld  58 (*)    Calcium 6.5 (*)    Total Protein 5.3 (*)    Albumin 3.2 (*)    All other components within normal limits  ACETAMINOPHEN LEVEL - Abnormal; Notable for the following components:   Acetaminophen (Tylenol), Serum <10 (*)    All other components within normal limits  SALICYLATE LEVEL - Abnormal; Notable for the following components:   Salicylate Lvl <7.0 (*)    All other components within normal limits  URINALYSIS, ROUTINE W REFLEX MICROSCOPIC - Abnormal; Notable for the following components:   Color, Urine YELLOW (*)    APPearance TURBID (*)    Ketones, ur 80 (*)    Protein, ur 30 (*)    All other components within normal limits  CBG MONITORING, ED - Abnormal; Notable for the following components:   Glucose-Capillary 63 (*)    All other components within normal limits  CBG MONITORING, ED - Abnormal; Notable for the following components:   Glucose-Capillary 176 (*)    All other components within normal limits  ETHANOL    EKG None  Radiology No results found.  Procedures Procedures   Medications Ordered in ED Medications  sodium chloride 0.9 % bolus 500 mL (0 mLs Intravenous Stopped 09/01/20 1525)  dextrose (GLUTOSE) 40 % oral gel 37.5 g (37.5 g Oral Given 09/01/20 1614)    ED Course  I have reviewed the triage vital signs and the nursing notes.  Pertinent labs & imaging results that were available during my care of the patient were reviewed by me and considered in my medical decision making (see chart for details).    MDM Rules/Calculators/A&P                          20 y.o. M with PMH/o ADHD, autism who presents for concerns for possible "radiation disease" that he believes occurred after a CT scan. He feels like he is continued to be sick from this. He denies SI,HI. No auditory hallucination, no visual hallucinations. Patient is afebrile, non-toxic appearing, sitting comfortably on examination table. Vital  signs reviewed and stable. Plan to check medical  clearance labs and TTS consultation.   Salicylate level, ethanol, acetaminophen level are within normal limits. CMP shows glucose of 58. Will give him food and oral glucose and recheck. UDS is positive for benzo. CBC shows no leukocytosis or anemia.   I discussed results with patient, including his hypoglycemia.  He unfortunately pulled out his IV so we could not give him any amp of D50.  Will give oral glucose instead.  I discussed with him the results of today's work-up.  I had reviewed his chart extensively.  He does have history of von recklinghausen disease.  He has not had any imaging of his head since his concern for radiation exposure started.  I did discuss with him that we could obtain a CT head to ensure that there is no intracranial abnormality that would be contributing to his thoughts.  After discussing with patient, he states he did not want that. I discussed with him what we would be looking for and the risk first benefits of obtaining CT scan.  He states he does not want any more imaging.  Additionally, I discussed with patient regarding talking to his mom.  He did not give me consent to talk to his mom regarding his work-up here today.  At this time, he is alert and oriented x3 and denies any SI, HI.  Repeat CBG is 176.  Patient asked to talk to me.  I discussed with patient.  He states that he would like to be discharged home.  I discussed extensively with patient and had a very long conversation about further work-up here in the ED.  I did offer him evaluation by TTS and stated that this was already ordered.  He states he does not want to wait and talk to TTS.  He states that this is what has happened before and there is never an answer.  He states he is just concerned about continued radiation exposure/poisoning and that it may be potentially getting worse.  I discussed with him at length regarding his reassuring work-up here in the ED today.  I discussed with him that he we give him  outpatient resources to follow-up with.  I did ask patient again if he had any SI, HI and he denied both.  Additionally, he is alert and oriented x3 and is able to answer all my questions.  Besides a concern of radiation poisoning, he is able to clearly discuss with me and does verbalize understanding that his lab work is reassuring.  He is not slurring his speech, appears clinically sober. At this time, he is not agitated, threat to himself.  He is able to discuss recently regarding some things but does have some evidence of paranoia as evidenced by his concern that he has been having radiation poisoning.  At this time, I do not feel that this is enough for an IVC as he is otherwise clear and coherent and able to answer my questions.  I again asked him if I can discuss with his mom regarding his visit here today but he tells me I do not permission to discuss with her.  He does not have any thoughts of wanting to hurt or kill himself or anybody else.  Patient is asking to be discharged. At this time, patient exhibits no emergent life-threatening condition that require further evaluation in ED. Discussed patient with Dr. Rhunette Croft who is agreeable to plan.  Patient had ample opportunity for questions and  discussion. All patient's questions were answered with full understanding. Strict return precautions discussed. Patient expresses understanding and agreement to plan.   Portions of this note were generated with Scientist, clinical (histocompatibility and immunogenetics). Dictation errors may occur despite best attempts at proofreading.  Final Clinical Impression(s) / ED Diagnoses Final diagnoses:  Paranoia [facility])    Rx / DC Orders ED Discharge Orders    None       Rosana Hoes 09/01/20 2218    Derwood Kaplan, MD 09/03/20 1122

## 2020-09-01 NOTE — ED Triage Notes (Signed)
Pt arrived via EMS, from home, states he was "exposed to radiation" . When asked to elaborate, pt states he has recent CT scan that later caused his entire body to burn x3 wks. Denies any SI/HI has been seen recently for psych issues and cleared. Calm and cooperative in triage.

## 2020-09-01 NOTE — ED Notes (Signed)
Pt given juice with sugar at this time and encouraged to drink.

## 2020-09-01 NOTE — Discharge Instructions (Signed)
I have provided you some resources.  Please return to the Emergency Dept for any chest pain, difficulty breathing, vomiting, or any other worsening concerns.

## 2020-09-05 ENCOUNTER — Encounter: Payer: Self-pay | Admitting: Emergency Medicine

## 2020-09-05 ENCOUNTER — Emergency Department
Admission: EM | Admit: 2020-09-05 | Discharge: 2020-09-05 | Disposition: A | Discharge status: Home / Self Care | Payer: No Typology Code available for payment source | Attending: Emergency Medicine | Admitting: Emergency Medicine

## 2020-09-05 DIAGNOSIS — Z79899 Other long term (current) drug therapy: Secondary | ICD-10-CM | POA: Diagnosis not present

## 2020-09-05 DIAGNOSIS — F429 Obsessive-compulsive disorder, unspecified: Secondary | ICD-10-CM | POA: Diagnosis not present

## 2020-09-05 DIAGNOSIS — F840 Autistic disorder: Secondary | ICD-10-CM | POA: Insufficient documentation

## 2020-09-05 DIAGNOSIS — B356 Tinea cruris: Secondary | ICD-10-CM | POA: Insufficient documentation

## 2020-09-05 DIAGNOSIS — Z87891 Personal history of nicotine dependence: Secondary | ICD-10-CM | POA: Diagnosis not present

## 2020-09-05 DIAGNOSIS — R4182 Altered mental status, unspecified: Secondary | ICD-10-CM | POA: Diagnosis present

## 2020-09-05 LAB — COMPREHENSIVE METABOLIC PANEL
ALT: 16 U/L (ref 0–44)
AST: 16 U/L (ref 15–41)
Albumin: 4.5 g/dL (ref 3.5–5.0)
Alkaline Phosphatase: 56 U/L (ref 38–126)
Anion gap: 10 (ref 5–15)
BUN: 7 mg/dL (ref 6–20)
CO2: 22 mmol/L (ref 22–32)
Calcium: 9.2 mg/dL (ref 8.9–10.3)
Chloride: 103 mmol/L (ref 98–111)
Creatinine, Ser: 0.72 mg/dL (ref 0.61–1.24)
GFR, Estimated: >60 mL/min (ref >60)
Glucose, Bld: 86 mg/dL (ref 70–99)
Potassium: 3.3 mmol/L — ABNORMAL LOW (ref 3.5–5.1)
Sodium: 135 mmol/L (ref 135–145)
Total Bilirubin: 0.7 mg/dL (ref 0.3–1.2)
Total Protein: 7.6 g/dL (ref 6.5–8.1)

## 2020-09-05 LAB — URINE DRUG SCREEN, QUALITATIVE (ARMC ONLY)
Amphetamines, Ur Screen: NOT DETECTED
Barbiturates, Ur Screen: NOT DETECTED
Benzodiazepine, Ur Scrn: POSITIVE — AB
Cannabinoid 50 Ng, Ur ~~LOC~~: NOT DETECTED
Cocaine Metabolite,Ur ~~LOC~~: NOT DETECTED
MDMA (Ecstasy)Ur Screen: NOT DETECTED
Methadone Scn, Ur: NOT DETECTED
Opiate, Ur Screen: NOT DETECTED
Phencyclidine (PCP) Ur S: NOT DETECTED
Tricyclic, Ur Screen: NOT DETECTED

## 2020-09-05 LAB — CBC
HCT: 44.6 % (ref 39.0–52.0)
Hemoglobin: 15.7 g/dL (ref 13.0–17.0)
MCH: 29.9 pg (ref 26.0–34.0)
MCHC: 35.2 g/dL (ref 30.0–36.0)
MCV: 85 fL (ref 80.0–100.0)
Platelets: 292 10*3/uL (ref 150–400)
RBC: 5.25 MIL/uL (ref 4.22–5.81)
RDW: 13.1 % (ref 11.5–15.5)
WBC: 5.3 10*3/uL (ref 4.0–10.5)
nRBC: 0 % (ref 0.0–0.2)

## 2020-09-05 LAB — ETHANOL: Alcohol, Ethyl (B): <10 mg/dL (ref <10)

## 2020-09-05 LAB — SALICYLATE LEVEL: Salicylate Lvl: <7 mg/dL — ABNORMAL LOW (ref 7.0–30.0)

## 2020-09-05 LAB — ACETAMINOPHEN LEVEL: Acetaminophen (Tylenol), Serum: <10 ug/mL — ABNORMAL LOW (ref 10–30)

## 2020-09-05 MED ORDER — ECONAZOLE NITRATE 1 % EX CREA: TOPICAL_CREAM | Freq: Two times a day (BID) | CUTANEOUS | 1 refills | Status: DC

## 2020-09-05 NOTE — ED Triage Notes (Signed)
EMS brings pt in from home; seen multiple times last wk for "radiation exposure" but has not f/u with psych; pt to ED lobby via w/c with no distress noted

## 2020-09-05 NOTE — ED Notes (Signed)
Pt changed out into hospital scrubs from personal clothing.  Personal Belongings include: 1 black shirt 1 gray short 2 brown shoes 1 black brief  All in bag and given to [place], Charity fundraiser in quad.

## 2020-09-05 NOTE — Discharge Instructions (Signed)
Take all of your medicines and continue to see your doctor for your anxiety symptoms.

## 2020-09-05 NOTE — ED Notes (Signed)
Pt here for repeated complaint. Pt denies following up with primary care doctors. Pt appears to be in NAD at this time. E-signature not working at this time. Pt verbalized understanding of D/C instructions, prescriptions and follow up care with no further questions at this time. Pt in NAD and ambulatory at time of D/C. Pt in lobby to wait for ride.

## 2020-09-05 NOTE — ED Triage Notes (Addendum)
Pt arrived via EMS from home where he resides with his mother. Pt to ED due to radiation exposure that he received from here while in behavioral unit and emergency department over last 2 weeks. Pt denies hallucinations, both visual and auditory. Pt denies SI and HI. Pt is calm and cooperative in triage. Pt sts, "I don't want to be committed again. Im not wanting to hurt myself and Im not crazy. Im just in pain and my body is hurting all over from the radiation exposure over the last 61 days."

## 2020-09-05 NOTE — ED Provider Notes (Signed)
[facility] Emergency Department Provider Note  ____________________________________________  Time seen: Approximately 9:06 PM  I have reviewed the triage vital signs and the nursing notes.   HISTORY  Chief Complaint Mental Health Problem  Level 5 Caveat: Portions of the History and Physical including HPI and review of systems are unable to be completely obtained due to patient being a poor historian    HPI Connor [name] is a 20 y.o. male with a history of ADHD, neurofibromatosis who comes to the ED complaining of burning whole body pain for the past 61 days ever since having a CT scan.  He has been seen in this ED and other healthcare facilities multiple times with persistent symptoms ranging from complaints of radiation poisoning to feeling like his intestines are dying.  He requests a DTPA injection for his radiation exposure.      Past Medical History:  Diagnosis Date  . ADHD (attention deficit hyperactivity disorder)   . Autism   . Depressed   . Neurofibromatosis (HCC)    Type 1  . Obesity   . Pertussis    as a infant     Patient Active Problem List   Diagnosis Date Noted  . Panic disorder 08/25/2020  . 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 PTSD (post-traumatic stress disorder) 07/11/2020  . Major depressive disorder, recurrent episode (HCC) 07/11/2020  . 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 ADHD (attention deficit hyperactivity disorder) 07/08/2017  . Personality disorder (HCC) 07/08/2017  . Insomnia 06/09/2017  . Aggression 01/31/2016  . 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 Autism spectrum disorder associated with known medical or genetic condition or environmental factor, requiring substantial support (level 2) 11/06/2015  . 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 Clinical von Recklinghausen's  disease (HCC) 11/06/2015     Past Surgical History:  Procedure Laterality Date  . MRI    . RADIOLOGY WITH ANESTHESIA N/A 08/13/2012   Procedure: RADIOLOGY WITH ANESTHESIA;  Surgeon: Medication Radiologist, MD;  Location: [facility] OR;  Service: Radiology;  Laterality: N/A;  MRI      Prior to Admission medications   Medication Sig Start Date End Date Taking? Authorizing Provider  econazole nitrate 1 % cream Apply topically 2 (two) times daily for 14 days. Apply to red rash in groin 09/05/20 09/19/20 Yes Sharman Cheek, MD  carbamazepine (TEGRETOL) 200 MG tablet Take 400 mg by mouth at bedtime.    [provider]  carbamazepine (TEGRETOL) 200 MG tablet Take 200 mg by mouth daily.    [provider]  desvenlafaxine (PRISTIQ) 100 MG 24 hr tablet Take 100 mg by mouth daily.    [provider]  gabapentin (NEURONTIN) 300 MG capsule Take 300 mg by mouth at bedtime. 08/02/20   [provider]  LORazepam (ATIVAN) 1 MG tablet Take 1 tablet (1 mg total) by mouth at bedtime. Patient taking differently: Take 1 mg by mouth daily as needed for anxiety. 08/22/20 08/22/21  Joni Reining, PA-C  Multiple Vitamins-Minerals (MULTIVITAMIN GUMMIES MENS) CHEW Chew 1 tablet by mouth daily.    [provider]  omeprazole (PRILOSEC) 40 MG capsule Take 1 capsule (40 mg total) by mouth 2 (two) times daily before a meal. 07/26/20 08/25/20  Vanga, Loel Dubonnet, MD  polyethylene glycol powder (GLYCOLAX/MIRALAX) 17 GM/SCOOP powder Take 255 g by mouth daily. Patient taking differently: Take 1 Container by mouth daily as needed  for severe constipation. 07/26/20   Toney Reil, MD  sucralfate (CARAFATE) 1 GM/10ML suspension Take 10 mLs by mouth 4 (four) times daily as needed (stomach). 08/25/20   [provider]  triamcinolone (KENALOG) 0.1 % Apply 1 application topically 3 (three) times daily as needed (skin irritation or burns). 08/31/20   [provider]  ziprasidone  (GEODON) 80 MG capsule Take 80 mg by mouth at bedtime.    [provider]     Allergies Patient has no known allergies.   Family History  Problem Relation Age of Onset  . Anxiety disorder Mother   . Depression Mother   . OCD Mother   . Hypertension Mother   . Cancer Maternal Aunt   . Cancer Maternal Grandmother   . Hypertension Father   . Schizophrenia Maternal Uncle     Social History Social History   Tobacco Use  . Smoking status: Former Smoker    Packs/day: 1.00    Years: 1.00    Pack years: 1.00    Types: Cigarettes    Quit date: 12/31/2014    Years since quitting: 5.6  . Smokeless tobacco: Never Used  Substance Use Topics  . Alcohol use: No    Alcohol/week: 0.0 standard drinks  . Drug use: No    Review of Systems  Constitutional:   No fever or chills.  ENT:   No sore throat. No rhinorrhea. Cardiovascular:   No chest pain or syncope. Respiratory:   No dyspnea or cough. Gastrointestinal:   Negative for abdominal pain, vomiting and diarrhea.  Musculoskeletal:   Negative for focal pain or swelling All other systems reviewed and are negative except as documented above in ROS and HPI.  ____________________________________________   PHYSICAL EXAM:  VITAL SIGNS: ED Triage Vitals  Enc Vitals Group     BP 09/05/20 1944 (!) 125/92     Pulse Rate 09/05/20 1944 92     Resp 09/05/20 1944 18     Temp 09/05/20 1944 98.7 F (37.1 C)     Temp Source 09/05/20 1944 Oral     SpO2 09/05/20 1942 98 %     Weight 09/05/20 1944 271 lb (122.9 kg)     Height 09/05/20 1944 6\' 3"  (1.905 m)     Head Circumference --      Peak Flow --      Pain Score --      Pain Loc --      Pain Edu? --      Excl. in GC? --     Vital signs reviewed, nursing assessments reviewed.   Constitutional:   Alert and oriented. Non-toxic appearance.  Anxious Eyes:   Conjunctivae are normal. EOMI.  ENT      Head:   Normocephalic and atraumatic.        Neck:   No meningismus. Full  ROM. Hematological/Lymphatic/Immunilogical:   No cervical lymphadenopathy. Cardiovascular:   RRR. Symmetric bilateral radial and DP pulses.  No murmurs. Cap refill less than 2 seconds. Respiratory:   Normal respiratory effort without tachypnea/retractions. Breath sounds are clear and equal bilaterally. No wheezes/rales/rhonchi. Gastrointestinal:   Soft and nontender. Non distended. There is no CVA tenderness.  No rebound, rigidity, or guarding. Genitourinary:   Tinea cruris.  Anatomically normal-appearing genitalia Musculoskeletal:   Normal range of motion in all extremities. No joint effusions.  No lower extremity tenderness.  No edema. Neurologic:   Normal speech and language.  Delusional thought content Motor grossly intact. No acute focal  neurologic deficits are appreciated.  Skin:    Skin is warm, dry with tinea rash in the groin as above.   No petechiae, purpura, or bullae.  ____________________________________________    LABS (pertinent positives/negatives) (all labs ordered are listed, but only abnormal results are displayed) Labs Reviewed  COMPREHENSIVE METABOLIC PANEL - Abnormal; Notable for the following components:      Result Value   Potassium 3.3 (*)    All other components within normal limits  SALICYLATE LEVEL - Abnormal; Notable for the following components:   Salicylate Lvl <7.0 (*)    All other components within normal limits  ACETAMINOPHEN LEVEL - Abnormal; Notable for the following components:   Acetaminophen (Tylenol), Serum <10 (*)    All other components within normal limits  URINE DRUG SCREEN, QUALITATIVE ([facility] ONLY) - Abnormal; Notable for the following components:   Benzodiazepine, Ur Scrn POSITIVE (*)    All other components within normal limits  ETHANOL  CBC   ____________________________________________   EKG    ____________________________________________    RADIOLOGY  No results  found.  ____________________________________________   PROCEDURES Procedures  ____________________________________________    CLINICAL IMPRESSION / ASSESSMENT AND PLAN / ED COURSE  Medications ordered in the ED: Medications - No data to display  Pertinent labs & imaging results that were available during my care of the patient were reviewed by me and considered in my medical decision making (see chart for details).  [name] was evaluated in Emergency Department on 09/05/2020 for the symptoms described in the history of present illness. He was evaluated in the context of the global COVID-19 pandemic, which necessitated consideration that the patient might be at risk for infection with the SARS-CoV-2 virus that causes COVID-19. Institutional protocols and algorithms that pertain to the evaluation of patients at risk for COVID-19 are in a state of rapid change based on information released by regulatory bodies including the CDC and federal and state organizations. These policies and algorithms were followed during the patient's care in the ED.   Patient presents with persistent delusional symptoms that have been identified as most likely OCD/panic related by his previous psychiatry evaluations for the same symptoms.  He is seeing an outpatient mental health professional, reports being compliant with medication.  I have encouraged him to continue following up and continue taking his medication.  For his tinea cruris will give econazole cream.  Vital signs are normal, no acute medical complaints.  Stable for discharge.      ____________________________________________   FINAL CLINICAL IMPRESSION(S) / ED DIAGNOSES    Final diagnoses:  Obsessive-compulsive disorder, unspecified type  Tinea cruris     ED Discharge Orders         Ordered    econazole nitrate 1 % cream  2 times daily        09/05/20 2104          Portions of this note were generated with  dragon dictation software. Dictation errors may occur despite best attempts at proofreading.   Sharman Cheek, MD 09/05/20 2109

## 2020-09-07 ENCOUNTER — Other Ambulatory Visit: Payer: Self-pay

## 2020-09-07 ENCOUNTER — Telehealth: Payer: Self-pay | Admitting: Gastroenterology

## 2020-09-07 DIAGNOSIS — R194 Change in bowel habit: Secondary | ICD-10-CM

## 2020-09-07 DIAGNOSIS — R1013 Epigastric pain: Secondary | ICD-10-CM

## 2020-09-07 DIAGNOSIS — R634 Abnormal weight loss: Secondary | ICD-10-CM

## 2020-09-07 NOTE — Telephone Encounter (Signed)
Patient's Mom called LVM to add them to cancellation list if possible

## 2020-09-07 NOTE — Telephone Encounter (Signed)
Will add if something comes up

## 2020-09-07 NOTE — Telephone Encounter (Signed)
Called patient mom and got patient scheduled for 10/03/2020. Sent new instructions to mychart and went over them with her. States they have prep at home. Updated the referral

## 2020-09-07 NOTE — Telephone Encounter (Signed)
Please call Mom to reschedule procedures.

## 2020-09-10 ENCOUNTER — Other Ambulatory Visit: Payer: Self-pay

## 2020-09-10 ENCOUNTER — Emergency Department
Admission: EM | Admit: 2020-09-10 | Discharge: 2020-09-10 | Disposition: A | Discharge status: Home / Self Care | Payer: Medicare Other | Attending: Student in an Organized Health Care Education/Training Program | Admitting: Student in an Organized Health Care Education/Training Program

## 2020-09-10 DIAGNOSIS — Z87891 Personal history of nicotine dependence: Secondary | ICD-10-CM | POA: Diagnosis not present

## 2020-09-10 DIAGNOSIS — R208 Other disturbances of skin sensation: Secondary | ICD-10-CM | POA: Insufficient documentation

## 2020-09-10 DIAGNOSIS — R202 Paresthesia of skin: Secondary | ICD-10-CM | POA: Diagnosis present

## 2020-09-10 NOTE — Discharge Instructions (Addendum)
Follow up with PCP and psychiatrist.  Return for any additional questions or concerns.

## 2020-09-10 NOTE — ED Triage Notes (Addendum)
Pt comes EMS from home. No report from EMS. Per pt, pt is here for radiation poisoning that he had "67 days ago". Pt ambulatory from ambulance. Pt states burning of his skin. Pt was admitted at [facility] recently but states "I am unable to absorb the medications because of the radiation". Pt denies that anything was better when he was on his medications. Pt cooperative at this time. Pt denies SI/HI/AVH at this time.

## 2020-09-10 NOTE — ED Provider Notes (Signed)
[facility] Emergency Department Provider Note    Event Date/Time   First MD Initiated Contact with Patient 09/10/20 1431     (approximate)  I have reviewed the triage vital signs and the nursing notes.   HISTORY  Chief Complaint radiation poison    HPI Connor [name] is a 20 y.o. male the below listed past medical history presents to the ER for evaluation of "radiation exposure ".  Patient states that he has been feeling tingling and burning sensation in his feet and hands for the past several weeks and months.  Has been seen multiple times for the same.  He relates this back to when CT imaging of his chest was ordered for chest pain.  Has not had any new medication changes.  He denies any fevers.  No nausea or vomiting.  States that they have checked his blood work multiple times and have not found anything wrong.  He is got follow-up with the psychiatrist this week but wanted to come in to see if there is anything that could be done in the meantime.    Past Medical History:  Diagnosis Date  . ADHD (attention deficit hyperactivity disorder)   . Autism   . Depressed   . Neurofibromatosis (HCC)    Type 1  . Obesity   . Pertussis    as a infant   Family History  Problem Relation Age of Onset  . Anxiety disorder Mother   . Depression Mother   . OCD Mother   . Hypertension Mother   . Cancer Maternal Aunt   . Cancer Maternal Grandmother   . Hypertension Father   . Schizophrenia Maternal Uncle    Past Surgical History:  Procedure Laterality Date  . MRI    . RADIOLOGY WITH ANESTHESIA N/A 08/13/2012   Procedure: RADIOLOGY WITH ANESTHESIA;  Surgeon: Medication Radiologist, MD;  Location: [facility] OR;  Service: Radiology;  Laterality: N/A;  MRI    Patient Active Problem List   Diagnosis Date Noted  . Panic disorder 08/25/2020  . 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 PTSD (post-traumatic stress  disorder) 07/11/2020  . Major depressive disorder, recurrent episode (HCC) 07/11/2020  . 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 ADHD (attention deficit hyperactivity disorder) 07/08/2017  . Personality disorder (HCC) 07/08/2017  . Insomnia 06/09/2017  . Aggression 01/31/2016  . 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 Autism spectrum disorder associated with known medical or genetic condition or environmental factor, requiring substantial support (level 2) 11/06/2015  . 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 Authorizing Provider  carbamazepine (TEGRETOL) 200 MG tablet Take 400 mg by mouth at bedtime.    [provider]  carbamazepine (TEGRETOL) 200 MG tablet Take 200 mg by mouth daily.    [provider]  desvenlafaxine (PRISTIQ) 100 MG 24 hr tablet Take 100 mg by mouth daily.    [provider]  econazole nitrate 1 % cream Apply topically 2 (two) times daily for 14 days. Apply to red rash in groin 09/05/20 09/19/20  Sharman Cheek, MD  gabapentin (NEURONTIN) 300 MG capsule Take 300 mg by mouth at bedtime. 08/02/20   [provider]  LORazepam (ATIVAN) 1 MG tablet Take 1 tablet (1 mg total) by mouth at  bedtime. Patient taking differently: Take 1 mg by mouth daily as needed for anxiety. 08/22/20 08/22/21  Joni Reining, PA-C  Multiple Vitamins-Minerals (MULTIVITAMIN GUMMIES MENS) CHEW Chew 1 tablet by mouth daily.    [provider]  omeprazole (PRILOSEC) 40 MG capsule Take 1 capsule (40 mg total) by mouth 2 (two) times daily before a meal. 07/26/20 08/25/20  Vanga, Loel Dubonnet, MD  polyethylene glycol powder (GLYCOLAX/MIRALAX) 17 GM/SCOOP powder Take 255 g by mouth daily. Patient taking differently: Take 1 Container by mouth daily as  needed for severe constipation. 07/26/20   Toney Reil, MD  sucralfate (CARAFATE) 1 GM/10ML suspension Take 10 mLs by mouth 4 (four) times daily as needed (stomach). 08/25/20   [provider]  triamcinolone (KENALOG) 0.1 % Apply 1 application topically 3 (three) times daily as needed (skin irritation or burns). 08/31/20   [provider]  ziprasidone (GEODON) 80 MG capsule Take 80 mg by mouth at bedtime.    [provider]    Allergies Patient has no known allergies.    Social History Social History   Tobacco Use  . Smoking status: Former Smoker    Packs/day: 1.00    Years: 1.00    Pack years: 1.00    Types: Cigarettes    Quit date: 12/31/2014    Years since quitting: 5.6  . Smokeless tobacco: Never Used  Substance Use Topics  . Alcohol use: No    Alcohol/week: 0.0 standard drinks  . Drug use: No    Review of Systems Patient denies headaches, rhinorrhea, blurry vision, numbness, shortness of breath, chest pain, edema, cough, abdominal pain, nausea, vomiting, diarrhea, dysuria, fevers, rashes or hallucinations unless otherwise stated above in HPI. ____________________________________________   PHYSICAL EXAM:  VITAL SIGNS: Vitals:   09/10/20 1343  BP: 126/85  Pulse: 85  Resp: 18  Temp: 98.1 F (36.7 C)  SpO2: 96%    Constitutional: Alert and oriented.  Eyes: Conjunctivae are normal.  Head: Atraumatic. Nose: No congestion/rhinnorhea. Mouth/Throat: Mucous membranes are moist.   Neck: No stridor. Painless ROM.  Cardiovascular: Normal rate, regular rhythm. Grossly normal heart sounds.  Good peripheral circulation. Respiratory: Normal respiratory effort.  No retractions. Lungs CTAB. Gastrointestinal: Soft and nontender. No distention. No abdominal bruits. No CVA tenderness. Genitourinary:  Musculoskeletal: No lower extremity tenderness nor edema.  No joint effusions. Neurologic:  Normal speech and language. No gross focal neurologic  deficits are appreciated. No facial droop Skin:  Skin is warm, dry and intact. No rash noted. Psychiatric: Mood and affect are anxious. Speech and behavior are normal.  ____________________________________________   LABS (all labs ordered are listed, but only abnormal results are displayed)  No results found for this or any previous visit (from the past 24 hour(s)). ____________________________________________  EKG____________________________________________   PROCEDURES  Procedure(s) performed:  Procedures    Critical Care performed: no ____________________________________________   INITIAL IMPRESSION / ASSESSMENT AND PLAN / ED COURSE  Pertinent labs & imaging results that were available during my care of the patient were reviewed by me and considered in my medical decision making (see chart for details).   DDX: Anxiety, conversion disorder, delusional parasitosis, depression, electrolyte abnormality, medication effect  Toren Tucholski [name] is a 20 y.o. who presents to the ED with presentation as described above.  Patient denies any SI or HI.  Symptoms seem to have been ongoing for many months.  I did recommend and offer the patient that we could check basic blood work and reassess  but he has not been exposed to significant amount of radiation certainly not enough to cause any symptoms especially this far out.  Patient declining any blood work.  He does appear mildly anxious but he is calm and cooperative at this time as he is denying any SI or HI I do not see indication for IVC.  I did recommend the patient be seen by psychiatry but patient does not want to wait to be seen here and states he would prefer to follow-up with his personal psychiatrist.  His exam is otherwise reassuring he is hemodynamically stable.  Patient requesting discharge paperwork.     The patient was evaluated in Emergency Department today for the symptoms described in the history of present illness.  He/she was evaluated in the context of the global COVID-19 pandemic, which necessitated consideration that the patient might be at risk for infection with the SARS-CoV-2 virus that causes COVID-19. Institutional protocols and algorithms that pertain to the evaluation of patients at risk for COVID-19 are in a state of rapid change based on information released by regulatory bodies including the CDC and federal and state organizations. These policies and algorithms were followed during the patient's care in the ED.  As part of my medical decision making, I reviewed the following data within the electronic MEDICAL RECORD NUMBER Nursing notes reviewed and incorporated, Labs reviewed, notes from prior ED visits and [place] Controlled Substance Database   ____________________________________________   FINAL CLINICAL IMPRESSION(S) / ED DIAGNOSES  Final diagnoses:  Burning sensation      NEW MEDICATIONS STARTED DURING THIS VISIT:  Discharge Medication List as of 09/10/2020  3:38 PM       Note:  This document was prepared using Dragon voice recognition software and may include unintentional dictation errors.    Willy Eddy, MD 09/10/20 [phone]

## 2020-09-12 ENCOUNTER — Other Ambulatory Visit: Payer: Self-pay

## 2020-09-12 ENCOUNTER — Inpatient Hospital Stay
Admission: EM | Admit: 2020-09-12 | Discharge: 2020-09-18 | DRG: 092 | Disposition: A | Discharge status: Other Acute Inpatient Hospital | Payer: Medicare Other | Attending: Internal Medicine | Admitting: Internal Medicine

## 2020-09-12 DIAGNOSIS — F459 Somatoform disorder, unspecified: Secondary | ICD-10-CM | POA: Diagnosis present

## 2020-09-12 DIAGNOSIS — G2402 Drug induced acute dystonia: Principal | ICD-10-CM | POA: Diagnosis present

## 2020-09-12 DIAGNOSIS — F840 Autistic disorder: Secondary | ICD-10-CM | POA: Diagnosis present

## 2020-09-12 DIAGNOSIS — M436 Torticollis: Secondary | ICD-10-CM | POA: Diagnosis present

## 2020-09-12 DIAGNOSIS — N179 Acute kidney failure, unspecified: Secondary | ICD-10-CM | POA: Diagnosis present

## 2020-09-12 DIAGNOSIS — F22 Delusional disorders: Secondary | ICD-10-CM | POA: Diagnosis present

## 2020-09-12 DIAGNOSIS — Q8501 Neurofibromatosis, type 1: Secondary | ICD-10-CM | POA: Diagnosis present

## 2020-09-12 DIAGNOSIS — E669 Obesity, unspecified: Secondary | ICD-10-CM | POA: Diagnosis present

## 2020-09-12 DIAGNOSIS — Z818 Family history of other mental and behavioral disorders: Secondary | ICD-10-CM

## 2020-09-12 DIAGNOSIS — F909 Attention-deficit hyperactivity disorder, unspecified type: Secondary | ICD-10-CM | POA: Diagnosis present

## 2020-09-12 DIAGNOSIS — E860 Dehydration: Secondary | ICD-10-CM | POA: Diagnosis present

## 2020-09-12 DIAGNOSIS — M6282 Rhabdomyolysis: Secondary | ICD-10-CM

## 2020-09-12 DIAGNOSIS — F419 Anxiety disorder, unspecified: Secondary | ICD-10-CM | POA: Diagnosis present

## 2020-09-12 DIAGNOSIS — I471 Supraventricular tachycardia: Secondary | ICD-10-CM | POA: Diagnosis present

## 2020-09-12 DIAGNOSIS — D72828 Other elevated white blood cell count: Secondary | ICD-10-CM | POA: Diagnosis present

## 2020-09-12 DIAGNOSIS — R45851 Suicidal ideations: Secondary | ICD-10-CM | POA: Diagnosis present

## 2020-09-12 DIAGNOSIS — Z6832 Body mass index (BMI) 32.0-32.9, adult: Secondary | ICD-10-CM

## 2020-09-12 DIAGNOSIS — Z87891 Personal history of nicotine dependence: Secondary | ICD-10-CM

## 2020-09-12 DIAGNOSIS — F451 Undifferentiated somatoform disorder: Secondary | ICD-10-CM | POA: Diagnosis present

## 2020-09-12 DIAGNOSIS — Z20822 Contact with and (suspected) exposure to covid-19: Secondary | ICD-10-CM | POA: Diagnosis present

## 2020-09-12 DIAGNOSIS — Z79899 Other long term (current) drug therapy: Secondary | ICD-10-CM

## 2020-09-12 DIAGNOSIS — Z046 Encounter for general psychiatric examination, requested by authority: Secondary | ICD-10-CM

## 2020-09-12 LAB — RESP PANEL BY RT-PCR (FLU A&B, COVID) ARPGX2
Influenza A by PCR: NEGATIVE
Influenza B by PCR: NEGATIVE
SARS Coronavirus 2 by RT PCR: NEGATIVE

## 2020-09-12 MED ORDER — HALOPERIDOL LACTATE 5 MG/ML IJ SOLN
INTRAMUSCULAR | Status: AC
  Administered 2020-09-12: 10 mg via INTRAMUSCULAR
  Filled 2020-09-12: qty 2

## 2020-09-12 MED ORDER — DIPHENHYDRAMINE HCL 50 MG/ML IJ SOLN
50.0000 mg | Freq: Four times a day (QID) | INTRAMUSCULAR | Status: DC | PRN
  Administered 2020-09-13: 50 mg via INTRAMUSCULAR
  Filled 2020-09-12: qty 1

## 2020-09-12 MED ORDER — DIPHENHYDRAMINE HCL 50 MG/ML IJ SOLN
INTRAMUSCULAR | Status: AC
  Administered 2020-09-12: 50 mg via INTRAMUSCULAR
  Filled 2020-09-12: qty 1

## 2020-09-12 MED ORDER — LORAZEPAM 2 MG/ML IJ SOLN
2.0000 mg | Freq: Four times a day (QID) | INTRAMUSCULAR | Status: DC | PRN
  Administered 2020-09-13 – 2020-09-14 (×3): 2 mg via INTRAMUSCULAR
  Filled 2020-09-12 (×4): qty 1

## 2020-09-12 MED ORDER — HALOPERIDOL LACTATE 5 MG/ML IJ SOLN
10.0000 mg | Freq: Four times a day (QID) | INTRAMUSCULAR | Status: DC | PRN
  Administered 2020-09-13: 10 mg via INTRAMUSCULAR
  Filled 2020-09-12: qty 2

## 2020-09-12 MED ORDER — LORAZEPAM 2 MG/ML IJ SOLN
INTRAMUSCULAR | Status: AC
  Administered 2020-09-12: 2 mg via INTRAMUSCULAR
  Filled 2020-09-12: qty 1

## 2020-09-12 NOTE — Consult Note (Signed)
[facility]   Reason for Consult: Consult for this 20 year old man brought in under IVC filed by his mother because of his ongoing anger and anxiety and resultant behavior problems related to his delusions Referring Physician: Derrill KayGoodman Patient Identification: Eula Flaxdward Allen Richardson III MRN:  [id number] Principal Diagnosis: Delusional disorder [facility](HCC) Diagnosis:  Principal Problem:   Delusional disorder (HCC) Active Problems:   Neurofibromatosis, type I (von Recklinghausen's disease) (HCC)   Somatic symptom disorder   Total Time spent with patient: 1 hour  Subjective:   Lowella Dandydward Allen Kaus III is a 20 y.o. male patient admitted with "cannot you see that I am burning?".  HPI: Patient seen chart reviewed.  Patient well-known from many prior encounters.  This 20 year old man who had a past diagnosis of autistic spectrum disorder as well as anxiety symptoms is brought in under IVC papers filed by his mother.  His mother details how the patient continues to be obsessed with the belief that he has multiple physical ailments and that they are causing him profound distress.  This causes him to express anger and rage at home.  She reports that he has threatened to do her physical harm and has made suicidal statements.  She feels frightened to the point that she has had 2 hide knives and pills at home.  Patient has been refusing to comply with prescribed mental health medications.  His obsession with his believes in his illness has caused him to call 911 at least 40 times and to have at least 35 visits to the emergency room in the past 2 months.  Patient refuses to listen to or believe the information he has been given by doctors who tried to take his complaints seriously.  His behavior has become intolerable to his mother at home.  On interview I find a disheveled but generally healthy-appearing young man who appears in mental but not necessarily physical distress.  He paces a lot.   Talks a great deal about how his skin is burning and his body is being destroyed "on a cellular level".  Claims that his intestines have been falling out when he goes to the bathroom.  Continues to complain that all lights and sensation are intensely bright to him although he does not behave as if this were true.  Patient is convinced that this is all the result of "ionizing radiation" that perhaps started when he had a CT scan.  Ironically the CT scan itself was being performed to work-up abdominal complaints which were almost certainly psychosomatic as well.  Patient admits that he has refused to take the prescribed SSRI and other medications that his psychiatrist has prescribed for him for anxiety.  He insists that he does this because he believes they would not be absorbed and would not work.  Patient appears angry but was not physically threatening or intimidating to me.  Appears extremely frustrated but continues to talk in circles.  No amount of attempts at empathy understanding explanation or redirection seems to get through to him.  Past Psychiatric History: Patient had been diagnosed with autism as a youth.  His mother lists multiple diagnoses on the commitment paperwork including ADD, autism, bipolar disorder panic disorder.  Over these past 2 months I have had time to think about what to call his current condition.  For now I will settle on delusional disorder although it has also seemed to have some characteristics of severe OCD. We have, on other occasions, begun to admit him to psychiatric  hospitals but for various reasons I do not believe it has been done yet.  I do not believe he has ever been shown to have actually tried to kill himself.  Mother reports he has made threatening statements to her but I do not believe that we have evidence that he has ever followed through on physical violence. Risk to Self:   Risk to Others:   Prior Inpatient Therapy:   Prior Outpatient Therapy:    Past  Medical History:  Past Medical History:  Diagnosis Date  . ADHD (attention deficit hyperactivity disorder)   . Autism   . Depressed   . Neurofibromatosis (HCC)    Type 1  . Obesity   . Pertussis    as a infant    Past Surgical History:  Procedure Laterality Date  . MRI    . RADIOLOGY WITH ANESTHESIA N/A 08/13/2012   Procedure: RADIOLOGY WITH ANESTHESIA;  Surgeon: Medication Radiologist, MD;  Location: [facility] OR;  Service: Radiology;  Laterality: N/A;  MRI    Family History:  Family History  Problem Relation Age of Onset  . Anxiety disorder Mother   . Depression Mother   . OCD Mother   . Hypertension Mother   . Cancer Maternal Aunt   . Cancer Maternal Grandmother   . Hypertension Father   . Schizophrenia Maternal Uncle    Family Psychiatric  History: None reported Social History:  Social History   Substance and Sexual Activity  Alcohol Use No  . Alcohol/week: 0.0 standard drinks     Social History   Substance and Sexual Activity  Drug Use No    Social History   Socioeconomic History  . Marital status: Single    Spouse name: Not on file  . Number of children: Not on file  . Years of education: Not on file  . Highest education level: Not on file  Occupational History  . Not on file  Tobacco Use  . Smoking status: Former Smoker    Packs/day: 1.00    Years: 1.00    Pack years: 1.00    Types: Cigarettes    Quit date: 12/31/2014    Years since quitting: 5.7  . Smokeless tobacco: Never Used  Substance and Sexual Activity  . Alcohol use: No    Alcohol/week: 0.0 standard drinks  . Drug use: No  . Sexual activity: Not Currently    Birth control/protection: None  Other Topics Concern  . Not on file  Social History Narrative   Lorenzo is a high school drop out.   He lives with his mom only. He has one sister.   He enjoys eating, sleeping, and watching tv.   Social Determinants of Health   Financial Resource Strain: Not on file  Food Insecurity: Not on file   Transportation Needs: Not on file  Physical Activity: Not on file  Stress: Not on file  Social Connections: Not on file   Additional Social History:    Allergies:  No Known Allergies  Labs:  Results for orders placed or performed during the hospital encounter of 09/12/20 (from the past 48 hour(s))  Resp Panel by RT-PCR (Flu A&B, Covid) Nasopharyngeal Swab     Status: None   Collection Time: 09/12/20  2:24 PM   Specimen: Nasopharyngeal Swab; Nasopharyngeal(NP) swabs in vial transport medium  Result Value Ref Range   SARS Coronavirus 2 by RT PCR NEGATIVE NEGATIVE    Comment: (NOTE) SARS-CoV-2 target nucleic acids are NOT DETECTED.  The SARS-CoV-2 RNA  is generally detectable in upper respiratory specimens during the acute phase of infection. The lowest concentration of SARS-CoV-2 viral copies this assay can detect is 138 copies/mL. A negative result does not preclude SARS-Cov-2 infection and should not be used as the sole basis for treatment or other patient management decisions. A negative result may occur with  improper specimen collection/handling, submission of specimen other than nasopharyngeal swab, presence of viral mutation(s) within the areas targeted by this assay, and inadequate number of viral copies(<138 copies/mL). A negative result must be combined with clinical observations, patient history, and epidemiological information. The expected result is Negative.  Fact Sheet for Patients:  BloggerCourse.com  Fact Sheet for Healthcare Providers:  SeriousBroker.it  This test is no t yet approved or cleared by the [place] FDA and  has been authorized for detection and/or diagnosis of SARS-CoV-2 by FDA under an Emergency Use Authorization (EUA). This EUA will remain  in effect (meaning this test can be used) for the duration of the COVID-19 declaration under Section 564(b)(1) of the Act, 21 U.S.C.section  360bbb-3(b)(1), unless the authorization is terminated  or revoked sooner.       Influenza A by PCR NEGATIVE NEGATIVE   Influenza B by PCR NEGATIVE NEGATIVE    Comment: (NOTE) The Xpert Xpress SARS-CoV-2/FLU/RSV plus assay is intended as an aid in the diagnosis of influenza from Nasopharyngeal swab specimens and should not be used as a sole basis for treatment. Nasal washings and aspirates are unacceptable for Xpert Xpress SARS-CoV-2/FLU/RSV testing.  Fact Sheet for Patients: BloggerCourse.com  Fact Sheet for Healthcare Providers: SeriousBroker.it  This test is not yet approved or cleared by the [place] FDA and has been authorized for detection and/or diagnosis of SARS-CoV-2 by FDA under an Emergency Use Authorization (EUA). This EUA will remain in effect (meaning this test can be used) for the duration of the COVID-19 declaration under Section 564(b)(1) of the Act, 21 U.S.C. section 360bbb-3(b)(1), unless the authorization is terminated or revoked.  Performed at [facility], [address] [place], [place] [address]     No current facility-administered medications for this encounter.   Current Outpatient Medications  Medication Sig Dispense Refill  . carbamazepine (TEGRETOL) 200 MG tablet Take 400 mg by mouth at bedtime.    . carbamazepine (TEGRETOL) 200 MG tablet Take 200 mg by mouth daily.    Marland Kitchen desvenlafaxine (PRISTIQ) 100 MG 24 hr tablet Take 100 mg by mouth daily.    Marland Kitchen econazole nitrate 1 % cream Apply topically 2 (two) times daily for 14 days. Apply to red rash in groin 30 g 1  . gabapentin (NEURONTIN) 300 MG capsule Take 300 mg by mouth at bedtime.    Marland Kitchen LORazepam (ATIVAN) 1 MG tablet Take 1 tablet (1 mg total) by mouth at bedtime. (Patient taking differently: Take 1 mg by mouth daily as needed for anxiety.) 10 tablet 0  . Multiple Vitamins-Minerals (MULTIVITAMIN GUMMIES MENS) CHEW Chew 1 tablet by  mouth daily.    Marland Kitchen omeprazole (PRILOSEC) 40 MG capsule Take 1 capsule (40 mg total) by mouth 2 (two) times daily before a meal. 60 capsule 0  . polyethylene glycol powder (GLYCOLAX/MIRALAX) 17 GM/SCOOP powder Take 255 g by mouth daily. (Patient taking differently: Take 1 Container by mouth daily as needed for severe constipation.) 255 g 3  . sucralfate (CARAFATE) 1 GM/10ML suspension Take 10 mLs by mouth 4 (four) times daily as needed (stomach).    . triamcinolone (KENALOG) 0.1 % Apply 1  application topically 3 (three) times daily as needed (skin irritation or burns).    . ziprasidone (GEODON) 80 MG capsule Take 80 mg by mouth at bedtime.      Musculoskeletal: Strength & Muscle Tone: within normal limits Gait & Station: normal Patient leans: N/A            Psychiatric Specialty Exam:  Presentation  General Appearance: No data recorded Eye Contact:No data recorded Speech:No data recorded Speech Volume:No data recorded Handedness:No data recorded  Mood and Affect  Mood:No data recorded Affect:No data recorded  Thought Process  Thought Processes:No data recorded Descriptions of Associations:No data recorded Orientation:No data recorded Thought Content:No data recorded History of Schizophrenia/Schizoaffective disorder:No  Duration of Psychotic Symptoms:Less than six months  Hallucinations:No data recorded Ideas of Reference:No data recorded Suicidal Thoughts:No data recorded Homicidal Thoughts:No data recorded  Sensorium  Memory:No data recorded Judgment:No data recorded Insight:No data recorded  Executive Functions  Concentration:No data recorded Attention Span:No data recorded Recall:No data recorded Fund of Knowledge:No data recorded Language:No data recorded  Psychomotor Activity  Psychomotor Activity:No data recorded  Assets  Assets:No data recorded  Sleep  Sleep:No data recorded  Physical Exam: Physical Exam Vitals and nursing note reviewed.   Constitutional:      Appearance: Normal appearance.  HENT:     Head: Normocephalic and atraumatic.     Mouth/Throat:     Pharynx: Oropharynx is clear.  Eyes:     Pupils: Pupils are equal, round, and reactive to light.  Cardiovascular:     Rate and Rhythm: Normal rate and regular rhythm.  Pulmonary:     Effort: Pulmonary effort is normal.     Breath sounds: Normal breath sounds.  Abdominal:     General: Abdomen is flat.     Palpations: Abdomen is soft.  Musculoskeletal:        General: Normal range of motion.  Skin:    General: Skin is warm and dry.  Neurological:     General: No focal deficit present.     Mental Status: He is alert. Mental status is at baseline.  Psychiatric:        Attention and Perception: He is inattentive.        Mood and Affect: Mood is anxious. Affect is angry.        Speech: Speech is tangential.        Behavior: Behavior is agitated. Behavior is not aggressive.        Thought Content: Thought content is paranoid and delusional.        Cognition and Memory: Cognition normal.        Judgment: Judgment is inappropriate.    Review of Systems  Constitutional: Negative.   HENT: Negative.   Eyes: Negative.   Respiratory: Negative.   Cardiovascular: Negative.   Gastrointestinal: Negative.   Musculoskeletal: Negative.   Skin: Negative.        Patient insists that he feels as though all of his skin is "burning".  He does not behave as though he were in any pain and there is no sign of anything abnormal about his skin.  Neurological: Negative.   Psychiatric/Behavioral: Positive for suicidal ideas. The patient is nervous/anxious and has insomnia.    Blood pressure (!) 144/103, pulse (!) 103, temperature 98.6 F (37 C), temperature source Oral, resp. rate 18, height  (1.905 m), weight 122 kg, SpO2 95 %. Body mass index is 33.62 kg/m.  Treatment Plan Summary: Plan This is a 20 year old man who  is clearly suffering great distress related to his  belief that he has multiple terrible medical problems.  No amount of evidence fax medical intervention etc. has made a dent in his level of distress.  I have tried several times to convince him to take antianxiety medicines but he steadfastly refuses.  Meanwhile his behavior at home is making his mother's life extremely difficult and causing her to feel threatened and causing her to feel afraid for his own safety.  At this point although we have no beds available at our hospital I will uphold the IVC and have asked TTS to please refer the patient out to other inpatient psychiatric wards for admission.  Patient will be offered medication here although based on previous encounters I expect he will refuse it.  If he becomes difficult to manage physically we may need to use forced injections.  Disposition: Recommend psychiatric Inpatient admission when medically cleared. Supportive therapy provided about ongoing stressors.  Mordecai Rasmussen, MD 09/12/2020 4:41 PM

## 2020-09-12 NOTE — ED Notes (Signed)
Pt changed out by this RN, Amy [employer] officer and Vernona Rieger NT. Belongings include: white shirt, 2 brown shoes, 1 grey shorts.

## 2020-09-12 NOTE — ED Triage Notes (Signed)
Pt arrives IVC by his mother for not taking his medication and believing that he has radiation poisoning from a CT scan a few months ago. Has been for the same every few days.

## 2020-09-12 NOTE — Consult Note (Signed)
  Addendum: Patient has become agitated shouting pacing insisting that people "turn off the electricity".  No evidence yet of physical harm to self or others but extremely disruptive.  Orders have been placed for IM medication for acute agitation and psychosis.  Team aware.

## 2020-09-12 NOTE — BH Assessment (Addendum)
Per Dr. Toni Amend pt meet criteria for INPT   Pt is to be reviewed with [facility] during night shift.

## 2020-09-12 NOTE — ED Provider Notes (Signed)
[facility] Emergency Department Provider Note   ____________________________________________   Event Date/Time   First MD Initiated Contact with Patient 09/12/20 1400     (approximate)  I have reviewed the triage vital signs and the nursing notes.   HISTORY  Chief Complaint No chief complaint on file.    HPI Connor [name] is a 20 y.o. male with a past medical history of ADHD, autism, neurofibromatosis type I, and delusional disorder who presents under IVC by his mother stating that patient is not taking his medications as well as voicing homicidal and suicidal ideation.  Mother states that he has made multiple statements claiming "I wish I could kill you".  Does not specify any statements of suicidal ideation.  Patient only concerned about the continuing delusion that he has radiation poisoning after a CT scan.  Patient has been worked up extensively for this fictitious disorder and this work-up has not shown any abnormalities.  Patient currently denies any suicidal ideation, homicidal ideation, auditory/visual hallucinations.  Patient is very difficult to redirect into any other history of review of systems questions at this time.         Past Medical History:  Diagnosis Date  . ADHD (attention deficit hyperactivity disorder)   . Autism   . Depressed   . Neurofibromatosis (HCC)    Type 1  . Obesity   . Pertussis    as a infant    Patient Active Problem List   Diagnosis Date Noted  . Panic disorder 08/25/2020  . 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 PTSD (post-traumatic stress disorder) 07/11/2020  . Major depressive disorder, recurrent episode (HCC) 07/11/2020  . 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 ADHD (attention deficit hyperactivity disorder) 07/08/2017  . Personality disorder (HCC) 07/08/2017  . Insomnia 06/09/2017  . Aggression  01/31/2016  . 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 Autism spectrum disorder associated with known medical or genetic condition or environmental factor, requiring substantial support (level 2) 11/06/2015  . 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 MRI    . RADIOLOGY WITH ANESTHESIA N/A 08/13/2012   Procedure: RADIOLOGY WITH ANESTHESIA;  Surgeon: Medication Radiologist, MD;  Location: [facility] OR;  Service: Radiology;  Laterality: N/A;  MRI     Prior to Admission medications   Medication Sig Start Date End Date Taking? Authorizing Provider  carbamazepine (TEGRETOL) 200 MG tablet Take 400 mg by mouth at bedtime.    [provider]  carbamazepine (TEGRETOL) 200 MG tablet Take 200 mg by mouth daily.    [provider]  desvenlafaxine (PRISTIQ) 100 MG 24 hr tablet Take 100 mg by mouth daily.    [provider]  econazole nitrate 1 % cream Apply topically 2 (two) times daily for 14 days. Apply to red rash in groin 09/05/20 09/19/20  Sharman Cheek, MD  gabapentin (NEURONTIN) 300 MG capsule Take 300 mg by mouth at bedtime. 08/02/20   [provider]  LORazepam (ATIVAN) 1 MG tablet Take 1 tablet (1 mg total) by mouth at bedtime. Patient taking differently: Take 1 mg by mouth daily as needed for anxiety. 08/22/20 08/22/21  Joni Reining, PA-C  Multiple Vitamins-Minerals (MULTIVITAMIN GUMMIES MENS) CHEW Chew 1 tablet by mouth daily.    [provider]  omeprazole (PRILOSEC) 40 MG  capsule Take 1 capsule (40 mg total) by mouth 2 (two) times daily before a meal. 07/26/20 08/25/20  Vanga, Loel Dubonnet, MD  polyethylene glycol powder (GLYCOLAX/MIRALAX) 17 GM/SCOOP powder Take 255 g by mouth daily. Patient taking differently: Take 1 Container by mouth daily as needed for severe constipation. 07/26/20   Toney Reil, MD   sucralfate (CARAFATE) 1 GM/10ML suspension Take 10 mLs by mouth 4 (four) times daily as needed (stomach). 08/25/20   [provider]  triamcinolone (KENALOG) 0.1 % Apply 1 application topically 3 (three) times daily as needed (skin irritation or burns). 08/31/20   [provider]  ziprasidone (GEODON) 80 MG capsule Take 80 mg by mouth at bedtime.    [provider]    Allergies Patient has no known allergies.  Family History  Problem Relation Age of Onset  . Anxiety disorder Mother   . Depression Mother   . OCD Mother   . Hypertension Mother   . Cancer Maternal Aunt   . Cancer Maternal Grandmother   . Hypertension Father   . Schizophrenia Maternal Uncle     Social History Social History   Tobacco Use  . Smoking status: Former Smoker    Packs/day: 1.00    Years: 1.00    Pack years: 1.00    Types: Cigarettes    Quit date: 12/31/2014    Years since quitting: 5.7  . Smokeless tobacco: Never Used  Substance Use Topics  . Alcohol use: No    Alcohol/week: 0.0 standard drinks  . Drug use: No    Review of Systems Unable to assess ____________________________________________   PHYSICAL EXAM:  VITAL SIGNS: ED Triage Vitals  Enc Vitals Group     BP 09/12/20 1345 (!) 144/103     Pulse Rate 09/12/20 1345 (!) 103     Resp 09/12/20 1345 18     Temp 09/12/20 1345 98.6 F (37 C)     Temp Source 09/12/20 1345 Oral     SpO2 09/12/20 1345 95 %     Weight 09/12/20 1344 268 lb 15.4 oz (122 kg)     Height 09/12/20 1344 6\' 3"  (1.905 m)     Head Circumference --      Peak Flow --      Pain Score 09/12/20 1344 10     Pain Loc --      Pain Edu? --      Excl. in GC? --    Constitutional: Alert and oriented. Well appearing young Caucasian male in no acute distress. Eyes: Conjunctivae are normal. PERRL. Head: Atraumatic. Nose: No congestion/rhinnorhea. Mouth/Throat: Mucous membranes are moist. Neck: No stridor Cardiovascular: Grossly normal heart sounds.   Good peripheral circulation. Respiratory: Normal respiratory effort.  No retractions. Gastrointestinal: Soft and nontender. No distention. Musculoskeletal: No obvious deformities Neurologic:  Normal speech and language. No gross focal neurologic deficits are appreciated. Skin:  Skin is warm and dry. No rash noted. Psychiatric: Mood is calm and cooperative.  Affect is somewhat flat.  Speech is pressured and behavior is normal  ____________________________________________   LABS (all labs ordered are listed, but only abnormal results are displayed)  Labs Reviewed  RESP PANEL BY RT-PCR (FLU A&B, COVID) [id number]    PROCEDURES  Procedure(s) performed (including Critical Care):  Procedures   ____________________________________________   INITIAL IMPRESSION / ASSESSMENT AND PLAN / ED COURSE  As part of my medical decision making, I reviewed the following data within the electronic MEDICAL RECORD NUMBER Nursing notes reviewed and  incorporated, Labs reviewed, Old chart reviewed, and Notes from prior ED visits reviewed and incorporated     Patient presents under IVC for hallucinations/delusions. Thoughts are disorganized. No history of prior suicide attempt, and no SI or HI at this time. Clinically w/ no overt toxidrome, low suspicion for ingestion given hx and exam Thoughts unlikely 2/2 anemia, hypothyroidism, infection, or ICH. Patients decision making capacity is compromised and they are unable to perform all ADLs (additionally they are without appropriate caretakers to assist through this deficit).  Consult: Psychiatry to evaluate patient for grave disability Disposition: Pending psychiatric evaluation  Care of this patient will be signed out the oncoming physician.  All pertinent patient formation is conveyed and all questions answered.  All further care and disposition decisions will be made by the oncoming physician.      ____________________________________________   FINAL  CLINICAL IMPRESSION(S) / ED DIAGNOSES  Final diagnoses:  Involuntary commitment  Delusional disorder [facility])     ED Discharge Orders    None       Note:  This document was prepared using Dragon voice recognition software and may include unintentional dictation errors.   Merwyn Katos, MD 09/12/20 [phone]

## 2020-09-12 NOTE — BH Assessment (Signed)
Comprehensive Clinical Assessment (CCA) Screening, Triage and Referral Note  09/12/2020 Connor [name] [id number]   Connor [name] is an y.o male who presents to [facility] ED involuntarily for treatment. Per triage note, Pt arrives IVC by his mother for not taking his medication and believing that he has radiation poisoning from a CT scan a few months ago. Has been for the same every few days.  During TTS assessment pt presents alert, anxious, manic, tangential, disorganized and oriented x 4, restless with some irritable but cooperative, and mood-congruent with affect. The pt does not appear to be responding to internal or external stimuli but presents with some delusional thinking related to his somatic complaints. Pt was able to verify the information provided to triage RN. Pt identified his main compliant to be his recent exposure to radiation and electricity in the [name]. Pt identified the following symptoms burning skin, darkening of skin, skin burns (not visible to TTS), electrical shocks, lack of sleep/appetite and hard time chewing and swallowing things stating, "I'm telling nothing will absorb because of the radiation".  Pt reports a MH of autism and anxiety but denies compliance with medications due to them "not absorbing".  Pt grew tangential around his intestines falling out, intense sensations, ionizing radiation and the need for a CT scan. Pt grew increasingly irritable and anxious in attempts to soothe his anxiety or express empathy and understanding. Pt angrily states, "you people think I am making this stuff up or crazy but its all true". Pt denies any SA. 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 Pt reports a family hx of MH/SA but is currently unable to provide specifics. Pt denies any current SI/HI/AH/VH and expresses to need medical attention only not INPT.   Per providers collateral gathered from mom [name] [phone]):  His mother details how the patient continues to be obsessed with the belief that he has multiple physical ailments and that they are causing him profound distress.  This causes him to express anger and rage at home.  She reports that he has threatened to do her physical harm and has made suicidal statements.  She feels frightened to the point that she has had 2 hide knives and pills at home.  Patient has been refusing to comply with prescribed mental health medications.  His obsession with his believes in his illness has caused him to call 911 at least 40 times and to have at least 35 visits to the emergency room in the past 2 months.    Per Dr. Toni Amend pt meets criteria for INPT  Chief Complaint:  Chief Complaint  Patient presents with  . Delusional   Visit Diagnosis: Delusional disorder   Patient Reported Information How did you hear about [place]? Self   Referral name: self   Referral phone number: No data recorded Whom do you see for routine medical problems? I don't have a doctor   Practice/Facility Name: No data recorded  Practice/Facility Phone Number: No data recorded  Name of Contact: Connor [name] Number: [phone]   Contact Fax Number: No data recorded  Prescriber Name: No data recorded  Prescriber Address (if known): No data recorded What Is the Reason for Your Visit/Call Today? Pt reports exposure to radiation causing his body to be in constant pain  How Long Has This Been Causing You Problems? 1-6 months  Have You Recently Been in Any Inpatient Treatment ([name]/Detox/Crisis Center/28-Day Program)? No   Name/Location of  Program/[name]:No data recorded  How Long Were You There? No data recorded  When Were You Discharged? No data recorded Have You Ever Received Services From [name] Before? Yes   Who Do You See at [facility]? ED visits/Medical/Psychiatric  Have You Recently Had Any Thoughts About Hurting Yourself? No   Are You Planning to Commit  Suicide/Harm Yourself At This time?  No  Have you Recently Had Thoughts About Hurting Someone Connor [name]? No   Explanation: No data recorded Have You Used Any Alcohol or Drugs in the Past 24 Hours? No   How Long Ago Did You Use Drugs or Alcohol?  No data recorded  What Did You Use and How Much? No data recorded What Do You Feel Would Help You the Most Today? Treatment for Depression or other mood problem; Medication(s); Stress Management  Do You Currently Have a Therapist/Psychiatrist? Yes   Name of Therapist/Psychiatrist: [facility]   Have You Been Recently Discharged From Any Office Practice or Programs? No   Explanation of Discharge From Practice/Program:  No data recorded    CCA Screening Triage Referral Assessment Type of Contact: Face-to-Face   Is this Initial or Reassessment? Initial Assessment   Date Telepsych consult ordered in [facility]:  09/12/2020   Time Telepsych consult ordered in [facility]:  1335  Patient Reported Information Reviewed? Yes   Patient Left Without Being Seen? No data recorded  Reason for Not Completing Assessment: No data recorded Collateral Involvement: Pt consented for his mother to be updated only as needed- Joie Reamer [phone]  Does Patient Have a Court Appointed Legal Guardian? No data recorded  Name and Contact of Legal Guardian:  No data recorded If Minor and Not Living with Parent(s), Who has Custody? n/a  Is CPS involved or ever been involved? Never  Is APS involved or ever been involved? Never  Patient Determined To Be At Risk for Harm To Self or Others Based on Review of Patient Reported Information or Presenting Complaint? No   Method: No data recorded  Availability of Means: No data recorded  Intent: No data recorded  Notification Required: No data recorded  Additional Information for Danger to Others Potential:  No data recorded  Additional Comments for Danger to Others Potential:  No data recorded  Are There Guns  or Other Weapons in Your Home?  No data recorded   Types of Guns/Weapons: No data recorded   Are These Weapons Safely Secured?                              No data recorded   Who Could Verify You Are Able To Have These Secured:    No data recorded Do You Have any Outstanding Charges, Pending Court Dates, Parole/Probation? No data recorded Contacted To Inform of Risk of Harm To Self or Others: No data recorded Location of Assessment: [name]  Does Patient Present under Involuntary Commitment? Yes   IVC Papers Initial File Date: 09/12/2020   [place] of Residence: [place]  Patient Currently Receiving the Following Services: Individual Therapy   Determination of Need: Emergent (2 hours)   Options For Referral: Inpatient Hospitalization; Medication Management; Outpatient Therapy   Opal Sidles, LCSWA

## 2020-09-12 NOTE — ED Notes (Signed)
This RN attempted to dress patient out with assistance of Vernona Rieger, NT & Caitlyn, NT. Pt became extremely aggressive and screaming "I need medical help not psychiatric help, I dont want to stay". Pt clenching fist towards this RN. Pt unable to be verbally deescalated. Medications ordered by MD Clapacs and administered as ordered. Pt willingly took shots by this RN and Lawyer without needing restraints. Pt now remains in room.

## 2020-09-13 ENCOUNTER — Observation Stay: Payer: Medicare Other

## 2020-09-13 DIAGNOSIS — F22 Delusional disorders: Secondary | ICD-10-CM | POA: Diagnosis not present

## 2020-09-13 DIAGNOSIS — G2402 Drug induced acute dystonia: Secondary | ICD-10-CM | POA: Diagnosis present

## 2020-09-13 LAB — COMPREHENSIVE METABOLIC PANEL
ALT: 31 U/L (ref 0–44)
AST: 59 U/L — ABNORMAL HIGH (ref 15–41)
Albumin: 5.5 g/dL — ABNORMAL HIGH (ref 3.5–5.0)
Alkaline Phosphatase: 74 U/L (ref 38–126)
Anion gap: 19 — ABNORMAL HIGH (ref 5–15)
BUN: 9 mg/dL (ref 6–20)
CO2: 16 mmol/L — ABNORMAL LOW (ref 22–32)
Calcium: 9.8 mg/dL (ref 8.9–10.3)
Chloride: 103 mmol/L (ref 98–111)
Creatinine, Ser: 1.22 mg/dL (ref 0.61–1.24)
GFR, Estimated: >60 mL/min (ref >60)
Glucose, Bld: 150 mg/dL — ABNORMAL HIGH (ref 70–99)
Potassium: 3.8 mmol/L (ref 3.5–5.1)
Sodium: 138 mmol/L (ref 135–145)
Total Bilirubin: 0.7 mg/dL (ref 0.3–1.2)
Total Protein: 8.8 g/dL — ABNORMAL HIGH (ref 6.5–8.1)

## 2020-09-13 LAB — CBC WITH DIFFERENTIAL/PLATELET
Abs Immature Granulocytes: 0.1 10*3/uL — ABNORMAL HIGH (ref 0.00–0.07)
Basophils Absolute: 0.1 10*3/uL (ref 0.0–0.1)
Basophils Relative: 0 %
Eosinophils Absolute: 0 10*3/uL (ref 0.0–0.5)
Eosinophils Relative: 0 %
HCT: 51.7 % (ref 39.0–52.0)
Hemoglobin: 17.2 g/dL — ABNORMAL HIGH (ref 13.0–17.0)
Immature Granulocytes: 1 %
Lymphocytes Relative: 17 %
Lymphs Abs: 2.8 10*3/uL (ref 0.7–4.0)
MCH: 29.2 pg (ref 26.0–34.0)
MCHC: 33.3 g/dL (ref 30.0–36.0)
MCV: 87.8 fL (ref 80.0–100.0)
Monocytes Absolute: 1.3 10*3/uL — ABNORMAL HIGH (ref 0.1–1.0)
Monocytes Relative: 8 %
Neutro Abs: 12.6 10*3/uL — ABNORMAL HIGH (ref 1.7–7.7)
Neutrophils Relative %: 74 %
Platelets: 428 10*3/uL — ABNORMAL HIGH (ref 150–400)
RBC: 5.89 MIL/uL — ABNORMAL HIGH (ref 4.22–5.81)
RDW: 13.8 % (ref 11.5–15.5)
WBC: 16.8 10*3/uL — ABNORMAL HIGH (ref 4.0–10.5)
nRBC: 0 % (ref 0.0–0.2)

## 2020-09-13 LAB — CK: Total CK: 1259 U/L — ABNORMAL HIGH (ref 49–397)

## 2020-09-13 LAB — MAGNESIUM: Magnesium: 2.1 mg/dL (ref 1.7–2.4)

## 2020-09-13 MED ORDER — DIPHENHYDRAMINE HCL 50 MG/ML IJ SOLN
50.0000 mg | INTRAMUSCULAR | Status: DC | PRN
  Administered 2020-09-13 – 2020-09-14 (×2): 50 mg via INTRAMUSCULAR
  Filled 2020-09-13 (×2): qty 1

## 2020-09-13 MED ORDER — ENOXAPARIN SODIUM 60 MG/0.6ML ~~LOC~~ SOLN
0.5000 mg/kg | SUBCUTANEOUS | Status: DC
  Administered 2020-09-13: 60 mg via SUBCUTANEOUS
  Filled 2020-09-13 (×2): qty 0.6

## 2020-09-13 MED ORDER — SODIUM CHLORIDE 0.9% FLUSH
3.0000 mL | Freq: Two times a day (BID) | INTRAVENOUS | Status: DC
  Administered 2020-09-13 – 2020-09-17 (×7): 3 mL via INTRAVENOUS

## 2020-09-13 MED ORDER — ENOXAPARIN SODIUM 40 MG/0.4ML ~~LOC~~ SOLN: 40.0000 mg | SUBCUTANEOUS | Status: DC

## 2020-09-13 MED ORDER — LACTATED RINGERS IV BOLUS
1000.0000 mL | Freq: Once | INTRAVENOUS | Status: AC
  Administered 2020-09-13: 1000 mL via INTRAVENOUS

## 2020-09-13 MED ORDER — ACETAMINOPHEN 650 MG RE SUPP: 650.0000 mg | Freq: Four times a day (QID) | RECTAL | Status: DC | PRN

## 2020-09-13 MED ORDER — LORAZEPAM 2 MG/ML IJ SOLN: 2.0000 mg | Freq: Once | INTRAMUSCULAR | Status: AC

## 2020-09-13 MED ORDER — ZIPRASIDONE MESYLATE 20 MG IM SOLR
20.0000 mg | Freq: Once | INTRAMUSCULAR | Status: AC
  Administered 2020-09-13: 20 mg via INTRAMUSCULAR

## 2020-09-13 MED ORDER — DIPHENHYDRAMINE HCL 50 MG/ML IJ SOLN: 50.0000 mg | Freq: Once | INTRAMUSCULAR | Status: AC

## 2020-09-13 MED ORDER — LORAZEPAM 2 MG/ML IJ SOLN
INTRAMUSCULAR | Status: AC
  Administered 2020-09-13: 2 mg via INTRAMUSCULAR
  Filled 2020-09-13: qty 1

## 2020-09-13 MED ORDER — DIPHENHYDRAMINE HCL 50 MG/ML IJ SOLN
INTRAMUSCULAR | Status: AC
  Administered 2020-09-13: 50 mg via INTRAMUSCULAR
  Filled 2020-09-13: qty 1

## 2020-09-13 MED ORDER — ACETAMINOPHEN 325 MG PO TABS
650.0000 mg | ORAL_TABLET | Freq: Four times a day (QID) | ORAL | Status: DC | PRN
  Filled 2020-09-13: qty 2

## 2020-09-13 NOTE — ED Notes (Signed)
Pt eating dinner tray °

## 2020-09-13 NOTE — ED Notes (Signed)
Pt yelling loudly, states that he is burning. Explained that pt may not walk around unit yelling. Pt offered PRN IM injections to help relax him or given option to stop yelling. Pt states he would like the injections.

## 2020-09-13 NOTE — ED Notes (Addendum)
Pt to BHU 4. Oriented to unit. Pt requesting all power to building be cut off because of his "radiation problems". Pt with breakfast in hand, states he is unable to eat "because they told me not to". Pt offered to sit in room or dayroom. Pt states dayroom. Pt in bathroom currently.  Pt is alert and oriented, warm and dry in no distress. Pt. Denies SI, HI, and AVH. Pt. Calm and cooperative. Pt. Made aware of security cameras and Q15 minute rounds. Pt. Encouraged to let Nursing staff know of any concerns or needs.

## 2020-09-13 NOTE — ED Notes (Signed)
Pt awoke to see if he would accept moving to BHU. Pt became agitated stated he needed toget some "actual help." Pt stated he has been "exposed for 49 days and needs hardcore medication like antivirals because I'm burning all over." This RN tried to reassure pt that we are trying to get him the help he needs. RN suggested if pt would like to speak with doctor, pt stated, "I just need some actual help."   Pt remains cooperative at this time and has not left his room.

## 2020-09-13 NOTE — ED Notes (Signed)
IVC  MOVED  FROM  BHU  TO  RM 21  PENDING  PLACEMENT

## 2020-09-13 NOTE — ED Notes (Signed)
Pt given water. Back into room.

## 2020-09-13 NOTE — ED Notes (Signed)
Pt in dayroom yelling, states that his skin in burning from the power. Requesting power be cut off. Explained that I will not cut off lights in dayroom due to safety of other patients. Offered to go back into his own room with lights off as solution, pt walked into room. occasional yelling from room.

## 2020-09-13 NOTE — Consult Note (Signed)
[facility]   Reason for Consult: Follow-up consult 20 year old man with delusions and obsessions focused on somatic symptoms combined with profound distress poor insight disruptive behavior Referring Physician: Siadecki Patient Identification: Connor [name] MRN:  [id number] Principal Diagnosis: Delusional disorder [facility](HCC) Diagnosis:  Principal Problem:   Delusional disorder (HCC) Active Problems:   Neurofibromatosis, type I (von Recklinghausen's disease) (HCC)   Somatic symptom disorder   Total Time spent with patient: 30 minutes  Subjective:   Connor [name] is a 20 y.o. male patient admitted with "I need help".  HPI: Presentation continues to be what it was yesterday.  Intermittently agitated.  Focused on his belief that radiation in the air is causing multiple medical problems.  Occasionally will get so agitated he starts screaming and shouting but has not actually been aggressive towards anyone.  Unresponsive to attempts to form any rapport or engage in conversation.  Past Psychiatric History: This is been going on for a couple of months.  Prior to that past history of autistic symptoms  Risk to Self:   Risk to Others:   Prior Inpatient Therapy:   Prior Outpatient Therapy:    Past Medical History:  Past Medical History:  Diagnosis Date  . ADHD (attention deficit hyperactivity disorder)   . Autism   . Depressed   . Neurofibromatosis (HCC)    Type 1  . Obesity   . Pertussis    as a infant    Past Surgical History:  Procedure Laterality Date  . MRI    . RADIOLOGY WITH ANESTHESIA N/A 08/13/2012   Procedure: RADIOLOGY WITH ANESTHESIA;  Surgeon: Medication Radiologist, MD;  Location: [facility] OR;  Service: Radiology;  Laterality: N/A;  MRI    Family History:  Family History  Problem Relation Age of Onset  . Anxiety disorder Mother   . Depression Mother   . OCD Mother   . Hypertension Mother   . Cancer Maternal Aunt   .  Cancer Maternal Grandmother   . Hypertension Father   . Schizophrenia Maternal Uncle    Family Psychiatric  History: See previous Social History:  Social History   Substance and Sexual Activity  Alcohol Use No  . Alcohol/week: 0.0 standard drinks     Social History   Substance and Sexual Activity  Drug Use No    Social History   Socioeconomic History  . Marital status: Single    Spouse name: Not on file  . Number of children: Not on file  . Years of education: Not on file  . Highest education level: Not on file  Occupational History  . Not on file  Tobacco Use  . Smoking status: Former Smoker    Packs/day: 1.00    Years: 1.00    Pack years: 1.00    Types: Cigarettes    Quit date: 12/31/2014    Years since quitting: 5.7  . Smokeless tobacco: Never Used  Substance and Sexual Activity  . Alcohol use: No    Alcohol/week: 0.0 standard drinks  . Drug use: No  . Sexual activity: Not Currently    Birth control/protection: None  Other Topics Concern  . Not on file  Social History Narrative   [name] is a high school drop out.   He lives with his mom only. He has one sister.   He enjoys eating, sleeping, and watching tv.   Social Determinants of Health   Financial Resource Strain: Not on file  Food Insecurity: Not on file  Transportation Needs: Not on file  Physical Activity: Not on file  Stress: Not on file  Social Connections: Not on file   Additional Social History:    Allergies:  No Known Allergies  Labs:  Results for orders placed or performed during the hospital encounter of 09/12/20 (from the past 48 hour(s))  Resp Panel by RT-PCR (Flu A&B, Covid) Nasopharyngeal Swab     Status: None   Collection Time: 09/12/20  2:24 PM   Specimen: Nasopharyngeal Swab; Nasopharyngeal(NP) swabs in vial transport medium  Result Value Ref Range   SARS Coronavirus 2 by RT PCR NEGATIVE NEGATIVE    Comment: (NOTE) SARS-CoV-2 target nucleic acids are NOT DETECTED.  The  SARS-CoV-2 RNA is generally detectable in upper respiratory specimens during the acute phase of infection. The lowest concentration of SARS-CoV-2 viral copies this assay can detect is 138 copies/mL. A negative result does not preclude SARS-Cov-2 infection and should not be used as the sole basis for treatment or other patient management decisions. A negative result may occur with  improper specimen collection/handling, submission of specimen other than nasopharyngeal swab, presence of viral mutation(s) within the areas targeted by this assay, and inadequate number of viral copies(<138 copies/mL). A negative result must be combined with clinical observations, patient history, and epidemiological information. The expected result is Negative.  Fact Sheet for Patients:  BloggerCourse.com  Fact Sheet for Healthcare Providers:  SeriousBroker.it  This test is no t yet approved or cleared by the [place] FDA and  has been authorized for detection and/or diagnosis of SARS-CoV-2 by FDA under an Emergency Use Authorization (EUA). This EUA will remain  in effect (meaning this test can be used) for the duration of the COVID-19 declaration under Section 564(b)(1) of the Act, 21 U.S.C.section 360bbb-3(b)(1), unless the authorization is terminated  or revoked sooner.       Influenza A by PCR NEGATIVE NEGATIVE   Influenza B by PCR NEGATIVE NEGATIVE    Comment: (NOTE) The Xpert Xpress SARS-CoV-2/FLU/RSV plus assay is intended as an aid in the diagnosis of influenza from Nasopharyngeal swab specimens and should not be used as a sole basis for treatment. Nasal washings and aspirates are unacceptable for Xpert Xpress SARS-CoV-2/FLU/RSV testing.  Fact Sheet for Patients: BloggerCourse.com  Fact Sheet for Healthcare Providers: SeriousBroker.it  This test is not yet approved or cleared by the  [place] FDA and has been authorized for detection and/or diagnosis of SARS-CoV-2 by FDA under an Emergency Use Authorization (EUA). This EUA will remain in effect (meaning this test can be used) for the duration of the COVID-19 declaration under Section 564(b)(1) of the Act, 21 U.S.C. section 360bbb-3(b)(1), unless the authorization is terminated or revoked.  Performed at [facility], [address] [place], [place] [address]     Current Facility-Administered Medications  Medication Dose Route Frequency Provider Last Rate Last Admin  . diphenhydrAMINE (BENADRYL) injection 50 mg  50 mg Intramuscular Q6H PRN Clapacs, John T, MD   50 mg at 09/13/20 1108  . haloperidol lactate (HALDOL) injection 10 mg  10 mg Intramuscular Q6H PRN Clapacs, Jackquline Denmark, MD   10 mg at 09/13/20 1108  . LORazepam (ATIVAN) injection 2 mg  2 mg Intramuscular Q6H PRN Clapacs, Jackquline Denmark, MD   2 mg at 09/13/20 1108   Current Outpatient Medications  Medication Sig Dispense Refill  . carbamazepine (TEGRETOL) 200 MG tablet Take 400 mg by mouth at bedtime.    . carbamazepine (TEGRETOL) 200 MG tablet Take  200 mg by mouth daily.    Marland Kitchen desvenlafaxine (PRISTIQ) 100 MG 24 hr tablet Take 100 mg by mouth daily.    Marland Kitchen econazole nitrate 1 % cream Apply topically 2 (two) times daily for 14 days. Apply to red rash in groin 30 g 1  . gabapentin (NEURONTIN) 300 MG capsule Take 300 mg by mouth at bedtime.    Marland Kitchen LORazepam (ATIVAN) 1 MG tablet Take 1 tablet (1 mg total) by mouth at bedtime. (Patient taking differently: Take 1 mg by mouth daily as needed for anxiety.) 10 tablet 0  . Multiple Vitamins-Minerals (MULTIVITAMIN GUMMIES MENS) CHEW Chew 1 tablet by mouth daily.    Marland Kitchen omeprazole (PRILOSEC) 40 MG capsule Take 1 capsule (40 mg total) by mouth 2 (two) times daily before a meal. 60 capsule 0  . polyethylene glycol powder (GLYCOLAX/MIRALAX) 17 GM/SCOOP powder Take 255 g by mouth daily. (Patient taking differently: Take 1  Container by mouth daily as needed for severe constipation.) 255 g 3  . sucralfate (CARAFATE) 1 GM/10ML suspension Take 10 mLs by mouth 4 (four) times daily as needed (stomach).    . triamcinolone (KENALOG) 0.1 % Apply 1 application topically 3 (three) times daily as needed (skin irritation or burns).    . ziprasidone (GEODON) 80 MG capsule Take 80 mg by mouth at bedtime.      Musculoskeletal: Strength & Muscle Tone: within normal limits Gait & Station: normal Patient leans: N/A            Psychiatric Specialty Exam:  Presentation  General Appearance: No data recorded Eye Contact:No data recorded Speech:No data recorded Speech Volume:No data recorded Handedness:No data recorded  Mood and Affect  Mood:No data recorded Affect:No data recorded  Thought Process  Thought Processes:No data recorded Descriptions of Associations:No data recorded Orientation:No data recorded Thought Content:No data recorded History of Schizophrenia/Schizoaffective disorder:No  Duration of Psychotic Symptoms:Less than six months  Hallucinations:No data recorded Ideas of Reference:No data recorded Suicidal Thoughts:No data recorded Homicidal Thoughts:No data recorded  Sensorium  Memory:No data recorded Judgment:No data recorded Insight:No data recorded  Executive Functions  Concentration:No data recorded Attention Span:No data recorded Recall:No data recorded Fund of Knowledge:No data recorded Language:No data recorded  Psychomotor Activity  Psychomotor Activity:No data recorded  Assets  Assets:No data recorded  Sleep  Sleep:No data recorded  Physical Exam: Physical Exam Vitals and nursing note reviewed.  Constitutional:      Appearance: Normal appearance.  HENT:     Head: Normocephalic and atraumatic.     Mouth/Throat:     Pharynx: Oropharynx is clear.  Eyes:     Pupils: Pupils are equal, round, and reactive to light.  Cardiovascular:     Rate and Rhythm: Normal  rate and regular rhythm.  Pulmonary:     Effort: Pulmonary effort is normal.     Breath sounds: Normal breath sounds.  Abdominal:     General: Abdomen is flat.     Palpations: Abdomen is soft.  Musculoskeletal:        General: Normal range of motion.  Skin:    General: Skin is warm and dry.  Neurological:     General: No focal deficit present.     Mental Status: He is alert. Mental status is at baseline.  Psychiatric:        Attention and Perception: He is inattentive.        Mood and Affect: Mood is anxious. Affect is inappropriate.        Speech: He  is noncommunicative.        Behavior: Behavior is agitated.        Thought Content: Thought content is paranoid and delusional. Thought content does not include homicidal or suicidal ideation.        Cognition and Memory: Cognition is impaired. Memory is impaired.        Judgment: Judgment is inappropriate.    Review of Systems  Constitutional: Negative.   HENT: Negative.   Eyes: Negative.   Respiratory: Negative.   Cardiovascular: Negative.   Gastrointestinal: Negative.   Musculoskeletal: Negative.   Skin: Negative.   Neurological: Negative.   Psychiatric/Behavioral: The patient is nervous/anxious and has insomnia.    Blood pressure (!) 154/97, pulse (!) 104, temperature 98.6 F (37 C), temperature source Oral, resp. rate 18, height 6\' 3"  (1.905 m), weight 122 kg, SpO2 100 %. Body mass index is 33.62 kg/m.  Treatment Plan Summary: Medication management and Plan Continue plan to refer him to inpatient psychiatric treatment.  No beds available on our unit at this time.  As needed medicine available for when he becomes too agitated.  Trying to form rapport and generate some insight with little success.  Disposition: Recommend psychiatric Inpatient admission when medically cleared. Supportive therapy provided about ongoing stressors.  , MD 09/13/2020 3:45 PM

## 2020-09-13 NOTE — ED Notes (Signed)
Pt given lunch

## 2020-09-13 NOTE — ED Notes (Signed)
Back to door requesting doctor, reiterated that he will see one today and is not in any critical condition at this time. Reiterated that I cannot turn off dayroom lights as requested.

## 2020-09-13 NOTE — ED Notes (Signed)
Pt up to the door wanting to know when he can see the doctor; updated on plan of care and apologized for delay.

## 2020-09-13 NOTE — H&P (Signed)
History and Physical    Connor [name] MCN:[id number] DOB: 03-14-2001 DOA: 09/12/2020  PCP: Abram Sander, MD  Patient coming from: Home via EMS  I have personally briefly reviewed patient's old medical records in [facility]  Chief Complaint: Delusion  HPI: Connor [name] is a 20 y.o. male with medical history significant for Autism spectrum disorder, delusional disorder, neurofibromatosis who was brought to the ED on 09/12/2020 with delusions, anxiety, and aggressive behavior.  He was reported to have expressed intention to cause physical harm to his mother as well as suicidal statements.  He was seen by psychiatry and placed under IVC with plan to admit to inpatient psychiatric ward pending bed availability.  He was placed on IM Ativan 2 mg every 6 hours as needed for anxiety, IM Haldol 10 mg every 6 hours as needed for agitation, and IM Benadryl every 6 hours as needed to be given with IM Haldol.  He received doses of these at 1735 on 3/22 and again earlier today at 1108.  He did also receive a dose of IM Geodon 20 mg at 0128.  Around 1700 today, nursing found him standing in the bathroom drooling.  Patient stated that he cannot breathe.  He was noted to be diaphoretic and pale.  He had torticolic twisting of his neck towards the left.  Vitals showed tachycardia with rates >160.  EKG was obtained and showed SVT with a rate 184 bpm.  Patient was given 2 mg IM Ativan and 50 mg IM Benadryl.  He was given 1 L LR fluid bolus.  Repeat EKG showed sinus tachycardia with rate down to 142.  He was reevaluated by psychiatry who feel this is a severe dystonic reaction with torticollis and less likely to be neuroleptic malignant syndrome.  Labs were obtained and notable for leukocytosis with WBC 16.8 and CK 1259.  Creatinine 1.22 compared to 0.72 09/05/2020.  Potassium 3.8, bicarb 16.  Given elevated CK and SVT, the hospitalist service was consulted to admit for further  evaluation and management.  MRI brain without contrast has been ordered and pending.  At time of my evaluation, patient is resting supine in bed.  He is awake and alert.  He states he was exposed to radiation via CT scan 71 days ago.  He reports feeling like he has some muscle cramping.  He denies any nausea, vomiting, abdominal pain.  He is moving all extremities equally without rigidity.  Affect is flat.  Review of Systems: All systems reviewed and are negative except as documented in history of present illness above.   Past Medical History:  Diagnosis Date  . ADHD (attention deficit hyperactivity disorder)   . Autism   . Depressed   . Neurofibromatosis (HCC)    Type 1  . Obesity   . Pertussis    as a infant    Past Surgical History:  Procedure Laterality Date  . MRI    . RADIOLOGY WITH ANESTHESIA N/A 08/13/2012   Procedure: RADIOLOGY WITH ANESTHESIA;  Surgeon: Medication Radiologist, MD;  Location: [facility] OR;  Service: Radiology;  Laterality: N/A;  MRI     Social History:  reports that he quit smoking about 5 years ago. His smoking use included cigarettes. He has a 1.00 pack-year smoking history. He has never used smokeless tobacco. He reports that he does not drink alcohol and does not use drugs.  No Known Allergies  Family History  Problem Relation Age of Onset  . Anxiety  disorder Mother   . Depression Mother   . OCD Mother   . Hypertension Mother   . Cancer Maternal Aunt   . Cancer Maternal Grandmother   . Hypertension Father   . Schizophrenia Maternal Uncle      Prior to Admission medications   Medication Sig Start Date End Date Taking? Authorizing Provider  carbamazepine (TEGRETOL) 200 MG tablet Take 400 mg by mouth at bedtime.    [provider]  carbamazepine (TEGRETOL) 200 MG tablet Take 200 mg by mouth daily.    [provider]  desvenlafaxine (PRISTIQ) 100 MG 24 hr tablet Take 100 mg by mouth daily.    [provider]  econazole  nitrate 1 % cream Apply topically 2 (two) times daily for 14 days. Apply to red rash in groin 09/05/20 09/19/20  Sharman Cheek, MD  gabapentin (NEURONTIN) 300 MG capsule Take 300 mg by mouth at bedtime. 08/02/20   [provider]  LORazepam (ATIVAN) 1 MG tablet Take 1 tablet (1 mg total) by mouth at bedtime. Patient taking differently: Take 1 mg by mouth daily as needed for anxiety. 08/22/20 08/22/21  Joni Reining, PA-C  Multiple Vitamins-Minerals (MULTIVITAMIN GUMMIES MENS) CHEW Chew 1 tablet by mouth daily.    [provider]  omeprazole (PRILOSEC) 40 MG capsule Take 1 capsule (40 mg total) by mouth 2 (two) times daily before a meal. 07/26/20 08/25/20  Vanga, Loel Dubonnet, MD  polyethylene glycol powder (GLYCOLAX/MIRALAX) 17 GM/SCOOP powder Take 255 g by mouth daily. Patient taking differently: Take 1 Container by mouth daily as needed for severe constipation. 07/26/20   Toney Reil, MD  sucralfate (CARAFATE) 1 GM/10ML suspension Take 10 mLs by mouth 4 (four) times daily as needed (stomach). 08/25/20   [provider]  triamcinolone (KENALOG) 0.1 % Apply 1 application topically 3 (three) times daily as needed (skin irritation or burns). 08/31/20   [provider]  ziprasidone (GEODON) 80 MG capsule Take 80 mg by mouth at bedtime.    [provider]    Physical Exam: Vitals:   09/13/20 0928 09/13/20 1750 09/13/20 1809 09/13/20 1831  BP: (!) 154/97 140/78 (!) 135/94 119/80  Pulse: (!) 104 (!) 171 (!) 138 (!) 122  Resp: 18 (!) 28 (!) 36 (!) 22  Temp:   100 F (37.8 C)   TempSrc:   Rectal   SpO2: 100% 95% 94% 97%  Weight:      Height:       Constitutional: Resting supine in bed, calm Eyes: PERRL, lids and conjunctivae normal ENMT: Mucous membranes are moist. Posterior pharynx clear of any exudate or lesions. Neck: normal, supple, no masses. Respiratory: clear to auscultation anteriorly.  Normal respiratory effort. No accessory muscle use.   Cardiovascular: Tachycardic,, no murmurs / rubs / gallops. No extremity edema. 2+ pedal pulses. Abdomen: no tenderness, no masses palpated. Musculoskeletal: no clubbing / cyanosis. No joint deformity upper and lower extremities. Good ROM, no contractures. Normal muscle tone.  Skin: no rashes, lesions, ulcers. No induration Neurologic: CN 2-12 grossly intact. Sensation intact. Strength 5/5 in all 4.  Psychiatric: Alert and oriented x 3.  Affect is flat.  Currently calm and answering questions.  Labs on Admission: I have personally reviewed following labs and imaging studies  CBC: Recent Labs  Lab 09/13/20 1800  WBC 16.8*  NEUTROABS 12.6*  HGB 17.2*  HCT 51.7  MCV 87.8  PLT 428*   Basic Metabolic Panel: Recent Labs  Lab 09/13/20 1800  NA 138  K 3.8  CL 103  CO2 16*  GLUCOSE 150*  BUN 9  CREATININE 1.22  CALCIUM 9.8   GFR: Estimated Creatinine Clearance: 133.7 mL/min (by C-G formula based on SCr of 1.22 mg/dL). Liver Function Tests: Recent Labs  Lab 09/13/20 1800  AST 59*  ALT 31  ALKPHOS 74  BILITOT 0.7  PROT 8.8*  ALBUMIN 5.5*   No results for input(s): LIPASE, AMYLASE in the last 168 hours. No results for input(s): AMMONIA in the last 168 hours. Coagulation Profile: No results for input(s): INR, PROTIME in the last 168 hours. Cardiac Enzymes: Recent Labs  Lab 09/13/20 1800  CKTOTAL 1,259*   BNP (last 3 results) No results for input(s): PROBNP in the last 8760 hours. HbA1C: No results for input(s): HGBA1C in the last 72 hours. CBG: No results for input(s): GLUCAP in the last 168 hours. Lipid Profile: No results for input(s): CHOL, HDL, LDLCALC, TRIG, CHOLHDL, LDLDIRECT in the last 72 hours. Thyroid Function Tests: No results for input(s): TSH, T4TOTAL, FREET4, T3FREE, THYROIDAB in the last 72 hours. Anemia Panel: No results for input(s): VITAMINB12, FOLATE, FERRITIN, TIBC, IRON, RETICCTPCT in the last 72 hours. Urine analysis:    Component Value  Date/Time   COLORURINE YELLOW (A) 09/01/2020 1345   APPEARANCEUR TURBID (A) 09/01/2020 1345   APPEARANCEUR Clear 08/03/2012 0930   LABSPEC 1.029 09/01/2020 1345   LABSPEC 1.035 08/03/2012 0930   PHURINE 5.0 09/01/2020 1345   GLUCOSEU NEGATIVE 09/01/2020 1345   GLUCOSEU Negative 08/03/2012 0930   HGBUR NEGATIVE 09/01/2020 1345   BILIRUBINUR NEGATIVE 09/01/2020 1345   BILIRUBINUR Negative 08/03/2012 0930   KETONESUR 80 (A) 09/01/2020 1345   PROTEINUR 30 (A) 09/01/2020 1345   NITRITE NEGATIVE 09/01/2020 1345   LEUKOCYTESUR NEGATIVE 09/01/2020 1345   LEUKOCYTESUR Negative 08/03/2012 0930    Radiological Exams on Admission: No results found.  EKG: Personally reviewed.  Initial EKG showed SVT with rate 191 bpm.  Repeat EKG showed sinus tachycardia, rate 142.  Previous EKG from 09/02/2020 showed sinus tachycardia, rate 109.  Assessment/Plan Principal Problem:   Delusional disorder (HCC) Active Problems:   Neurofibromatosis, type I (von Recklinghausen's disease) (HCC)   Somatic symptom disorder   Acute dystonic reaction due to drugs   Connor Dandydward Allen Mccubbins [name] is a 20 y.o. male with medical history significant for Autism spectrum disorder, delusional disorder, neurofibromatosis who is admitted with suspected severe acute dystonic reaction with elevated CK and SVT  Acute dystonic reaction with elevated CK: Suspected severe acute dystonic reaction secondary to antipsychotic medication.  He did receive IM Haldol and Geodon within the last 24 hours.  CK >1200.  Psychiatry feel this is less likely to be neuroleptic malignant syndrome. -S/p 1 L LR bolus, will give additional bolus this evening -Check urinalysis, monitor urine output -Creatinine increased from recent baseline but renal function still intact continue to monitor -Repeat CK in a.m. -No further neuroleptic medications, Haldol has already been discontinued -Continue IM Benadryl 50 mg q2h prn per psychiatry  SVT: Transient  SVT in setting of acute dystonic reaction.  Rate is improving but still in sinus tachycardia after receiving Ativan and 1 L LR. -Monitor on telemetry -Give additional 1 L LR tonight -Check magnesium level  Leukocytosis: Suspect this is reactive and less likely infectious.  Will obtain urinalysis as above.  Repeat labs in a.m., and if persistent will consider further infectious work-up at that time.  Delusional disorder/psychosis with agitation/aggressive behavior: Psychiatry following, appreciate assistance.  Under  IVC with plans for inpatient psychiatry admission once medically stable and bed available. -Continue IV Ativan 2 mg IM q6h as needed anxiety per psychiatry -Avoid further neuroleptics due to severe dystonic reaction  DVT prophylaxis: Lovenox Code Status: Full code Family Communication: Discussed with patient Disposition Plan: From home, dispo likely to inpatient psychiatry when medically stable Consults called: Psychiatry Level of care: Progressive Cardiac Admission status:  Status is: Observation  The patient remains OBS appropriate and will d/c before 2 midnights.  Dispo: The patient is from: Home              Anticipated d/c is to: Inpatient psychiatry              Patient currently is not medically stable to d/c.   Difficult to place patient Yes   Connor Mclean MD [facility]  If 7PM-7AM, please contact night-coverage [email or web address]  09/13/2020, 7:14 PM

## 2020-09-13 NOTE — ED Notes (Signed)
Resumed care from ally rn.  Pt in dayroom, knocking on door asking for phone, doctor.

## 2020-09-13 NOTE — ED Notes (Signed)
Received pt to ED room 20 from [facility] where pt noted to present with severe torticollis to the left; pt remained responsive with slight head nods yes/no. PIV placed emergently as benadryl was given IM; EDP and BHP at bedside managing care. Pt placed on cardiac monitor, EKG completed, and blood sent for labs. Pt tolerated all this well. Pt began to exhibit some relaxation of neck prior to RN leaving the room

## 2020-09-13 NOTE — ED Notes (Addendum)
Pt heard to be screaming from his room. MD placed orders. Pt noted to be pacing around room back and forth.   This Hydrologist, went into room to administer medication. Pt yelled out that he was in pain and needed a bone marrow transplant. Pt educated that this medicine will help his pain. Pt willingly took medication.   Once RN left room, pt continued screaming from his room.

## 2020-09-13 NOTE — Progress Notes (Signed)
PHARMACIST - PHYSICIAN COMMUNICATION  CONCERNING:  Enoxaparin (Lovenox) for DVT Prophylaxis    RECOMMENDATION: Patient was prescribed enoxaprin 40mg  q24 hours for VTE prophylaxis.   Filed Weights   09/12/20 1344  Weight: 122 kg (268 lb 15.4 oz)    Body mass index is 33.62 kg/m.  Estimated Creatinine Clearance: 133.7 mL/min (by C-G formula based on SCr of 1.22 mg/dL).   Based on [facility] policy patient is candidate for enoxaparin 0.5mg /kg TBW SQ every 24 hours based on BMI being >30.  DESCRIPTION: Pharmacy has adjusted enoxaparin dose per [facility] policy.  Patient is now receiving enoxaparin 60 mg every 24 hours    [facility], PharmD Clinical Pharmacist  09/13/2020 7:54 PM

## 2020-09-13 NOTE — ED Notes (Signed)
Pt up to bathroom and back to room at this time.

## 2020-09-13 NOTE — ED Notes (Signed)
Pt up to door every few minutes saying that he needs to see a doctor, "I'm in critical condition, I have been breathing in alpha particles, do you know what that means? I've been exposed, my hemoglobin in extremely low". reassured that VSS and pt is stable. Awaiting doctor, pt aware.

## 2020-09-13 NOTE — Consult Note (Signed)
  We will follow-up note on this patient.  I went back to check on him.  Since getting IM Benadryl and Ativan his clinical situation has improved dramatically.  He is no longer diaphoretic and he is able to move his neck easily.  He still shows no rigidity.  He does not feel hot to the touch.  He is able to talk and answer questions and is alert and oriented.  Labs came back.  Notable for elevated white count at 16 and elevated CK over 1200.  Kidney function for the time being still okay.  Based on everything we have seen so far I think it is most likely that this is a dystonic reaction and not neuroleptic malignant syndrome.  It is possible that he could have or develop into neuroleptic malignant syndrome but I think it is important not to put this diagnosis down and less we are sure of it because it makes a huge difference for future treatment.  For now he should not receive any further neuroleptic medication.  Agitation if needed can be managed with benzodiazepines and Benadryl or Cogentin.  I will continue to follow-up as he is being admitted to the medical service.

## 2020-09-13 NOTE — ED Notes (Signed)
IVC pending placement 

## 2020-09-13 NOTE — ED Notes (Signed)
Pt was in the bathroom for approx .  Pt said he was ok.  Pt  informed this rn was unlocking the door to check on him.  Pt was found standing by the sink holding onto it with drool from his mouth.  Pt said he could not breathe.  Security also with pt.  This rn got vital signs and found heart rate to be 175.  Dr clapacs also with pt.  ekg obtained with heart rate at 191.  Pt diaphoretic and pale, with muscle tightening and head turned to one side.   Pt drooling, ativan given per dr clapacs verbal order    Dr Larinda Buttery and clapacs with pt.  Pt moved to room 21 via wheelchair with doctors and this rn. 50mg  im benadryl given in room 21 per dr clapacs verbal order.  Dr in with pt.

## 2020-09-13 NOTE — ED Notes (Signed)
IVC moved to [facility] pending placement

## 2020-09-13 NOTE — ED Notes (Addendum)
Pt yelling loudly, heard from nurses station. Pt reports he is in pain and has been exposed to radiation for seventy days. Denies needs other than he wants medical help for radiation poisoning. Pt requests that the power to the hospital be turned off. Pt educated on need for power in an emergency department. Lights to patient room remain dimmed.

## 2020-09-13 NOTE — ED Notes (Signed)
Pt sat in dayroom and received injections voluntarily without any problems. Pt back into room now.

## 2020-09-13 NOTE — Consult Note (Signed)
Psychiatry note update: A little while ago, probably just over a half an hour, I was notified by nursing and security staff here in the [facility] that the patient had a change of his appearance.  Patient was in the bathroom standing and staring and drooling not moving.  By the time I got there he was seated in a chair.  Initially he was able to speak but said that he felt he could not breathe.  Oxygen saturation was staying above 95 on room air.  Heart monitor showed a tachycardia around 165-170.  Patient was becoming increasingly diaphoretic.  Did not complain of specific chest pain.  Over the course of several minutes his neck became more tortacolic twisted to the left.  Diaphoresis worsened.  Stat EKG was obtained and was read as likely atrial fibrillation.  Emergency room doctor was notified and immediately came on site to evaluate patient.  Patient's limbs were not stiff and rigid.  He retained the ability to stand up and move such that he could assist in transferring to and from a wheelchair.  Although he was diaphoretic he did not feel hot to the touch.  Patient was given 2 mg of Ativan stat.  He has now been given 50 mg of Benadryl both of these given IM.  Lab tests have been obtained and are pending.  ER doctor is in attendance and aware of the situation.  Repeat EKG has been performed and while he is still tachycardic around 145 he is now in sinus rhythm not in atrial fibrillation.  Based on current examination I think the most likely diagnosis is severe dystonic reaction with torticollis.  Neuroleptic malignant syndrome appears to be less likely at this point given that he currently seems to remain alert and oriented and is able to follow directions, there is no rigidity in his limbs and he does not appear to be hyperthermic.  He will continue to be monitored at this time and is receiving IV fluids and will be treated as necessary.  I have telephoned his mother and informed her of the current situation and  that she will be kept up-to-date as it develops.  Patient has also been advised of the likely diagnosis and current treatment plan.

## 2020-09-13 NOTE — ED Provider Notes (Signed)
-----------------------------------------   6:08 PM on 09/13/2020 -----------------------------------------  Patient found to be in the bathroom, standing at the sink with his head cocked to the left side with drooling out of the left side of his mouth.  He was noted to be tachycardic and initial EKG concerning for sinus tach versus SVT.  He was treated with 2 mg IM Ativan as well as 50 mg IM Benadryl due to concern for dystonic reaction, brought to the main ED in room 21.  Heart rate gradually improving following IM medications, repeat EKG consistent with sinus tachycardia, we will additionally start IV fluid.  We will recheck labs including CBC, CMP, and CK.  Low suspicion for NMS at this time as patient afebrile without extremity muscle stiffness.  Symptoms likely due to 10 mg IM Haldol that patient received earlier in the day.  We will continue to observe pending lab results, although patient appears to be gradually improving at this time with decreased neck stiffness.  ED ECG REPORT I, Chesley Noon, the attending physician, personally viewed and interpreted this ECG.   Date: 09/13/2020  EKG Time: 17:42  Rate: 191  Rhythm: sinus tachycardia vs SVT  Axis: Normal  Intervals:none  ST&T Change: None  ED ECG REPORT I, Chesley Noon, the attending physician, personally viewed and interpreted this ECG.   Date: 09/13/2020  EKG Time: 18:04  Rate: 142  Rhythm: sinus tachycardia  Axis: Normal  Intervals:none  ST&T Change: None  ----------------------------------------- 7:11 PM on 09/13/2020 -----------------------------------------  Patient significantly improved on reassessment, now looking straight forward without drooling, communicating normally.  He states his neck feels much better and his heart rate is improving gradually as well, now in the 110s to 120s.  Labs remarkable for mild rhabdomyolysis with CK of greater than 1000.  Given rhabdomyolysis associated with dystonic reaction,  case discussed with hospitalist for admission.     Chesley Noon, MD 09/13/20 1911

## 2020-09-13 NOTE — ED Notes (Signed)
Pt sitting in dayroom muttering to himself.

## 2020-09-13 NOTE — BH Assessment (Signed)
Referral information for Psychiatric Hospitalization faxed to;    [phone])   [facility]([phone])   [facility] ([phone])    [facility]- Per Charge nurse Matt, pt denied; no appropriate beds.

## 2020-09-13 NOTE — ED Provider Notes (Signed)
Emergency Medicine Observation Re-evaluation Note  Connor [name] is a 20 y.o. male, seen on rounds today.  Pt initially presented to the ED for complaints of Delusional Currently, the patient is resting, voices no medical complaint.  Physical Exam  BP (!) 144/103 (BP Location: Left Arm)   Pulse (!) 103   Temp 98.6 F (37 C) (Oral)   Resp 18   Ht 6\' 3"  (1.905 m)   Wt 122 kg   SpO2 95%   BMI 33.62 kg/m  Physical Exam General: Resting in no acute distress Cardiac: No cyanosis Lungs: Equal rise and fall Psych: Agitated  ED Course / MDM  EKG:   I have reviewed the labs performed to date as well as medications administered while in observation.  Recent changes in the last 24 hours include patient required IM calming agent for agitation and screaming which was not able to be verbally redirected.  IM calming agent given with good success and patient has been resting since.  Plan  Current plan is for psychiatric disposition. Patient is under full IVC at this time.   , MD 09/13/20 [phone]

## 2020-09-14 DIAGNOSIS — D72828 Other elevated white blood cell count: Secondary | ICD-10-CM | POA: Diagnosis present

## 2020-09-14 DIAGNOSIS — F909 Attention-deficit hyperactivity disorder, unspecified type: Secondary | ICD-10-CM | POA: Diagnosis present

## 2020-09-14 DIAGNOSIS — F459 Somatoform disorder, unspecified: Secondary | ICD-10-CM | POA: Diagnosis present

## 2020-09-14 DIAGNOSIS — Z818 Family history of other mental and behavioral disorders: Secondary | ICD-10-CM | POA: Diagnosis not present

## 2020-09-14 DIAGNOSIS — Z87891 Personal history of nicotine dependence: Secondary | ICD-10-CM | POA: Diagnosis not present

## 2020-09-14 DIAGNOSIS — F22 Delusional disorders: Secondary | ICD-10-CM | POA: Diagnosis present

## 2020-09-14 DIAGNOSIS — E669 Obesity, unspecified: Secondary | ICD-10-CM | POA: Diagnosis present

## 2020-09-14 DIAGNOSIS — Z6832 Body mass index (BMI) 32.0-32.9, adult: Secondary | ICD-10-CM | POA: Diagnosis not present

## 2020-09-14 DIAGNOSIS — Q8501 Neurofibromatosis, type 1: Secondary | ICD-10-CM | POA: Diagnosis not present

## 2020-09-14 DIAGNOSIS — F840 Autistic disorder: Secondary | ICD-10-CM | POA: Diagnosis present

## 2020-09-14 DIAGNOSIS — Z79899 Other long term (current) drug therapy: Secondary | ICD-10-CM | POA: Diagnosis not present

## 2020-09-14 DIAGNOSIS — I471 Supraventricular tachycardia: Secondary | ICD-10-CM | POA: Diagnosis present

## 2020-09-14 DIAGNOSIS — G2402 Drug induced acute dystonia: Secondary | ICD-10-CM | POA: Diagnosis present

## 2020-09-14 DIAGNOSIS — F419 Anxiety disorder, unspecified: Secondary | ICD-10-CM | POA: Diagnosis present

## 2020-09-14 DIAGNOSIS — N179 Acute kidney failure, unspecified: Secondary | ICD-10-CM | POA: Diagnosis present

## 2020-09-14 DIAGNOSIS — E860 Dehydration: Secondary | ICD-10-CM | POA: Diagnosis present

## 2020-09-14 DIAGNOSIS — Z20822 Contact with and (suspected) exposure to covid-19: Secondary | ICD-10-CM | POA: Diagnosis present

## 2020-09-14 DIAGNOSIS — M436 Torticollis: Secondary | ICD-10-CM | POA: Diagnosis present

## 2020-09-14 DIAGNOSIS — R45851 Suicidal ideations: Secondary | ICD-10-CM | POA: Diagnosis present

## 2020-09-14 LAB — CBC
HCT: 44.9 % (ref 39.0–52.0)
Hemoglobin: 15.5 g/dL (ref 13.0–17.0)
MCH: 29.8 pg (ref 26.0–34.0)
MCHC: 34.5 g/dL (ref 30.0–36.0)
MCV: 86.2 fL (ref 80.0–100.0)
Platelets: 311 10*3/uL (ref 150–400)
RBC: 5.21 MIL/uL (ref 4.22–5.81)
RDW: 13.9 % (ref 11.5–15.5)
WBC: 10.6 10*3/uL — ABNORMAL HIGH (ref 4.0–10.5)
nRBC: 0 % (ref 0.0–0.2)

## 2020-09-14 LAB — BASIC METABOLIC PANEL
Anion gap: 11 (ref 5–15)
BUN: 7 mg/dL (ref 6–20)
CO2: 24 mmol/L (ref 22–32)
Calcium: 9 mg/dL (ref 8.9–10.3)
Chloride: 104 mmol/L (ref 98–111)
Creatinine, Ser: 0.73 mg/dL (ref 0.61–1.24)
GFR, Estimated: >60 mL/min (ref >60)
Glucose, Bld: 106 mg/dL — ABNORMAL HIGH (ref 70–99)
Potassium: 3.1 mmol/L — ABNORMAL LOW (ref 3.5–5.1)
Sodium: 139 mmol/L (ref 135–145)

## 2020-09-14 LAB — HIV ANTIBODY (ROUTINE TESTING W REFLEX): HIV Screen 4th Generation wRfx: NONREACTIVE

## 2020-09-14 LAB — CK: Total CK: 873 U/L — ABNORMAL HIGH (ref 49–397)

## 2020-09-14 MED ORDER — CARBAMAZEPINE 200 MG PO TABS
200.0000 mg | ORAL_TABLET | Freq: Two times a day (BID) | ORAL | Status: DC
  Administered 2020-09-14 – 2020-09-18 (×8): 200 mg via ORAL
  Filled 2020-09-14 (×10): qty 1

## 2020-09-14 MED ORDER — POTASSIUM CHLORIDE CRYS ER 20 MEQ PO TBCR
40.0000 meq | EXTENDED_RELEASE_TABLET | Freq: Two times a day (BID) | ORAL | Status: AC
  Administered 2020-09-14 (×2): 40 meq via ORAL
  Filled 2020-09-14 (×2): qty 2

## 2020-09-14 MED ORDER — ENOXAPARIN SODIUM 60 MG/0.6ML ~~LOC~~ SOLN
0.5000 mg/kg | SUBCUTANEOUS | Status: DC
  Administered 2020-09-14 – 2020-09-17 (×3): 60 mg via SUBCUTANEOUS
  Filled 2020-09-14 (×5): qty 0.6

## 2020-09-14 MED ORDER — SODIUM CHLORIDE 0.9 % IV BOLUS: 1000.0000 mL | Freq: Once | INTRAVENOUS | Status: DC

## 2020-09-14 MED ORDER — DESVENLAFAXINE SUCCINATE ER 100 MG PO TB24
100.0000 mg | ORAL_TABLET | Freq: Every day | ORAL | Status: DC
  Administered 2020-09-15 – 2020-09-18 (×4): 100 mg via ORAL
  Filled 2020-09-14 (×2): qty 2
  Filled 2020-09-14: qty 1
  Filled 2020-09-14 (×4): qty 2

## 2020-09-14 MED ORDER — METOPROLOL TARTRATE 5 MG/5ML IV SOLN
2.5000 mg | INTRAVENOUS | Status: DC | PRN
  Administered 2020-09-14: 2.5 mg via INTRAVENOUS
  Filled 2020-09-14: qty 5

## 2020-09-14 MED ORDER — LORAZEPAM 2 MG/ML IJ SOLN
2.0000 mg | Freq: Once | INTRAMUSCULAR | Status: AC
  Administered 2020-09-14: 2 mg via INTRAMUSCULAR

## 2020-09-14 MED ORDER — SODIUM CHLORIDE 0.9 % IV BOLUS
1000.0000 mL | Freq: Once | INTRAVENOUS | Status: AC
  Administered 2020-09-14: 1000 mL via INTRAVENOUS

## 2020-09-14 MED ORDER — DIPHENHYDRAMINE HCL 50 MG/ML IJ SOLN
50.0000 mg | Freq: Once | INTRAMUSCULAR | Status: AC
  Administered 2020-09-14: 50 mg via INTRAMUSCULAR
  Filled 2020-09-14: qty 1

## 2020-09-14 MED ORDER — LACTATED RINGERS IV SOLN: INTRAVENOUS | Status: AC

## 2020-09-14 NOTE — ED Notes (Signed)
Returned phone call to mother, Alvis Lemmings.

## 2020-09-14 NOTE — BH Assessment (Signed)
[facility] ([id number] called to request pt's updated labs. Task completed at 6:07 AM.

## 2020-09-14 NOTE — ED Notes (Addendum)
Updated Juliet, receiving RN on HR and new fluid orders. Aware of increase in HR upon standing.

## 2020-09-14 NOTE — BH Assessment (Signed)
Referral information for Psychiatric Hospitalization faxed to;    [facility]([phone])Per [place], facility is closed to the region.    [phone])Per Shawna Orleans, there are no male beds at this time.    [facility] ([phone]) Verbal Screening completed at 2:01 AM. Vincenza Hews agreed to provide nursing staff with verbal screening for review.

## 2020-09-14 NOTE — Progress Notes (Signed)
Cross Cover Reported by RN patient with tongue swelling and adminstered IM benadryl previously ordered Bedside patient able to talk with tongue thickened quality. Is controlling own secretions. He is not in any respiratory distress. Does appear to have bitten tongue with lesion noted on right side towards tip. It is not actively bleeding. Review of chart shows this comp;aint of tongue swelling is not new His lung sounds are clear and he has no stridor.  VSS Nurse understands to monitor close

## 2020-09-14 NOTE — ED Notes (Signed)
On standing HR becomes 160-165. Sat down in wheelchair with monitor still in place. Verbal orders received for 1L NS bolus

## 2020-09-14 NOTE — ED Notes (Signed)
Taken to room 237 with sitter, security, on tele monitor, NS running and 1 of 1 labeled belonging bag, all of paperwork sent with patient. Pt in NAD. RR even and unlabored.

## 2020-09-14 NOTE — Progress Notes (Signed)
PROGRESS NOTE  Connor [name] AVW:[id number] DOB: Jan 22, 2001 DOA: 09/12/2020 PCP: Abram SanderAdamo, Elena M, MD  HPI/Recap of past 24 hours: Connor [name] is a 20 y.o. male with medical history significant for Autism spectrum disorder, delusional disorder, neurofibromatosis who was brought to the ED on 09/12/2020 with delusions, anxiety, and aggressive behavior.  He was reported to have expressed intention to cause physical harm to his mother as well as suicidal statements.  He was seen by psychiatry and placed under IVC with plan to admit to inpatient psychiatric ward pending bed availability.  He was placed on IM Ativan 2 mg every 6 hours as needed for anxiety, IM Haldol 10 mg every 6 hours as needed for agitation, and IM Benadryl every 6 hours as needed to be given with IM Haldol.  He received doses of these at 1735 on 3/22 and again earlier today at 1108.  He did also receive a dose of IM Geodon 20 mg at 0128.  Around 1700 on 09/13/20, nursing found him standing in the bathroom drooling.  Patient stated that he cannot breathe.  He was noted to be diaphoretic and pale.  He had torticolic twisting of his neck towards the left.  Vitals showed tachycardia with rates >160.  EKG was obtained and showed SVT with a rate 184 bpm.  Patient was given 2 mg IM Ativan and 50 mg IM Benadryl.  He was given 1 L LR fluid bolus.  Repeat EKG showed sinus tachycardia with rate down to 142.  He was reevaluated by psychiatry who feel this is a severe dystonic reaction with torticollis and less likely to be neuroleptic malignant syndrome.  Labs were obtained and notable for leukocytosis with WBC 16.8K and CK 1259.  Creatinine 1.22 compared to 0.72 09/05/2020.  Potassium 3.8, bicarb 16.  Given elevated CK and SVT, the hospitalist service was consulted to admit for further evaluation and management.  MRI brain without contrast done on 09/13/2020 revealed normal brain imaging  At time of Dr. Eliane DecreePatel's  evaluation, patient is resting supine in bed.  He is awake and alert.  He states he was exposed to radiation via CT scan 71 days ago.  He reports feeling like he has some muscle cramping.  He denies any nausea, vomiting, abdominal pain.  He is moving all extremities equally without rigidity.  Affect is flat.  09/14/20: Seen and examined at his bedside in the ED, he is alert and confused.  Assessment/Plan: Principal Problem:   Delusional disorder (HCC) Active Problems:   Neurofibromatosis, type I (von Recklinghausen's disease) (HCC)   Somatic symptom disorder   Acute dystonic reaction due to drugs  Acute dystonic reaction with elevated CK: Suspected severe acute dystonic reaction secondary to antipsychotic medication.  He did receive IM Haldol and Geodon within the last 24 hours.  CK >1200.  Psychiatry feel this is less likely to be neuroleptic malignant syndrome. -S/p 1 L LR bolus, will give additional bolus this evening -Check urinalysis, nitrite negative, budding yeast present.   -Monitor urine output -No further neuroleptic medications, Haldol has already been discontinued -Continue IM Benadryl 50 mg q2h prn per psychiatry -Rest of management per psychiatry.  Resolved AKI, likely prerenal in the setting of dehydration from poor oral intake At baseline creatinine 0.7 with GFR greater than 60 Currently back to his baseline Presented with creatinine 1.2  Avoid nephrotoxic agents Continue IV fluid hydration Monitor urine output  SVT: Transient SVT in setting of acute dystonic reaction.  Rate is improving but still in sinus tachycardia after receiving Ativan and 1 L LR. Continue IV fluid Continue to monitor on telemetry.  Leukocytosis, likely reactive Leukocytosis is resolving, no antibiotics.  Continue to monitor off antibiotics.  Delusional disorder/psychosis with agitation/aggressive behavior: Psychiatry following, appreciate assistance.   Under IVC with plans for  inpatient psychiatry admission once medically stable and bed available. -Continue IV Ativan 2 mg IM q6h as needed anxiety per psychiatry -Avoid further neuroleptics due to severe dystonic reaction Rest of management per psychiatry.  DVT prophylaxis: Lovenox subcu daily Code Status: Full code Family Communication:  Called the patient's mother on 09/14/2020, no answer will call again. Disposition Plan: From home, dispo likely to inpatient psychiatry when medically stable Consults called: Psychiatry Level of care: Progressive Cardiac  Admission status:  Status is: Observation  The patient remains OBS appropriate and will d/c before 2 midnights.  Dispo: The patient is from: Home  Anticipated d/c is to: Inpatient psychiatry  Patient currently is not medically stable to d/c.              Difficult to place patient Yes      Objective: Vitals:   09/14/20 0807 09/14/20 0842 09/14/20 1042 09/14/20 1242  BP: (!) 154/92 (!) 148/100 (!) 159/91 (!) 137/99  Pulse: (!) 138 (!) 139 (!) 133 (!) 107  Resp: 18 18 18 18   Temp:  99.4 F (37.4 C) 99 F (37.2 C) 99.5 F (37.5 C)  TempSrc:  Oral    SpO2: 98% 98% 99% 99%  Weight:  120.8 kg    Height:  6\' 4"  (1.93 m)      Intake/Output Summary (Last 24 hours) at 09/14/2020 1326 Last data filed at 09/14/2020 0930 Gross per 24 hour  Intake 0 ml  Output --  Net 0 ml   Filed Weights   09/12/20 1344 09/14/20 0842  Weight: 122 kg 120.8 kg    Exam:  . General: 20 y.o. year-old male well developed well nourished in no acute distress.  Alert and confused. . Cardiovascular: Tachycardic with no rubs or gallops.  No thyromegaly or JVD noted.   09/16/20 Respiratory: Clear to auscultation with no wheezes or rales. Good inspiratory effort. . Abdomen: Soft nontender nondistended with normal bowel sounds x4 quadrants. . Musculoskeletal: No lower extremity edema. 2/4 pulses in all 4 extremities. . Skin: No ulcerative lesions noted  or rashes, . Psychiatry: Flat affect.  Data Reviewed: CBC: Recent Labs  Lab 09/13/20 1800 09/14/20 0509  WBC 16.8* 10.6*  NEUTROABS 12.6*  --   HGB 17.2* 15.5  HCT 51.7 44.9  MCV 87.8 86.2  PLT 428* 311   Basic Metabolic Panel: Recent Labs  Lab 09/13/20 1800 09/14/20 0509  NA 138 139  K 3.8 3.1*  CL 103 104  CO2 16* 24  GLUCOSE 150* 106*  BUN 9 7  CREATININE 1.22 0.73  CALCIUM 9.8 9.0  MG 2.1  --    GFR: Estimated Creatinine Clearance: 205.7 mL/min (by C-G formula based on SCr of 0.73 mg/dL). Liver Function Tests: Recent Labs  Lab 09/13/20 1800  AST 59*  ALT 31  ALKPHOS 74  BILITOT 0.7  PROT 8.8*  ALBUMIN 5.5*   No results for input(s): LIPASE, AMYLASE in the last 168 hours. No results for input(s): AMMONIA in the last 168 hours. Coagulation Profile: No results for input(s): INR, PROTIME in the last 168 hours. Cardiac Enzymes: Recent Labs  Lab 09/13/20 1800 09/14/20 0509  CKTOTAL 1,259* 873*  BNP (last 3 results) No results for input(s): PROBNP in the last 8760 hours. HbA1C: No results for input(s): HGBA1C in the last 72 hours. CBG: No results for input(s): GLUCAP in the last 168 hours. Lipid Profile: No results for input(s): CHOL, HDL, LDLCALC, TRIG, CHOLHDL, LDLDIRECT in the last 72 hours. Thyroid Function Tests: No results for input(s): TSH, T4TOTAL, FREET4, T3FREE, THYROIDAB in the last 72 hours. Anemia Panel: No results for input(s): VITAMINB12, FOLATE, FERRITIN, TIBC, IRON, RETICCTPCT in the last 72 hours. Urine analysis:    Component Value Date/Time   COLORURINE YELLOW (A) 09/01/2020 1345   APPEARANCEUR TURBID (A) 09/01/2020 1345   APPEARANCEUR Clear 08/03/2012 0930   LABSPEC 1.029 09/01/2020 1345   LABSPEC 1.035 08/03/2012 0930   PHURINE 5.0 09/01/2020 1345   GLUCOSEU NEGATIVE 09/01/2020 1345   GLUCOSEU Negative 08/03/2012 0930   HGBUR NEGATIVE 09/01/2020 1345   BILIRUBINUR NEGATIVE 09/01/2020 1345   BILIRUBINUR Negative  08/03/2012 0930   KETONESUR 80 (A) 09/01/2020 1345   PROTEINUR 30 (A) 09/01/2020 1345   NITRITE NEGATIVE 09/01/2020 1345   LEUKOCYTESUR NEGATIVE 09/01/2020 1345   LEUKOCYTESUR Negative 08/03/2012 0930   Sepsis Labs: @LABRCNTIP (procalcitonin:4,lacticidven:4)  ) Recent Results (from the past 240 hour(s))  Resp Panel by RT-PCR (Flu A&B, Covid) Nasopharyngeal Swab     Status: None   Collection Time: 09/12/20  2:24 PM   Specimen: Nasopharyngeal Swab; Nasopharyngeal(NP) swabs in vial transport medium  Result Value Ref Range Status   SARS Coronavirus 2 by RT PCR NEGATIVE NEGATIVE Final    Comment: (NOTE) SARS-CoV-2 target nucleic acids are NOT DETECTED.  The SARS-CoV-2 RNA is generally detectable in upper respiratory specimens during the acute phase of infection. The lowest concentration of SARS-CoV-2 viral copies this assay can detect is 138 copies/mL. A negative result does not preclude SARS-Cov-2 infection and should not be used as the sole basis for treatment or other patient management decisions. A negative result may occur with  improper specimen collection/handling, submission of specimen other than nasopharyngeal swab, presence of viral mutation(s) within the areas targeted by this assay, and inadequate number of viral copies(<138 copies/mL). A negative result must be combined with clinical observations, patient history, and epidemiological information. The expected result is Negative.  Fact Sheet for Patients:  09/14/20  Fact Sheet for Healthcare Providers:  BloggerCourse.com  This test is no t yet approved or cleared by the [employer] FDA and  has been authorized for detection and/or diagnosis of SARS-CoV-2 by FDA under an Emergency Use Authorization (EUA). This EUA will remain  in effect (meaning this test can be used) for the duration of the COVID-19 declaration under Section 564(b)(1) of the Act,  21 U.S.C.section 360bbb-3(b)(1), unless the authorization is terminated  or revoked sooner.       Influenza A by PCR NEGATIVE NEGATIVE Final   Influenza B by PCR NEGATIVE NEGATIVE Final    Comment: (NOTE) The Xpert Xpress SARS-CoV-2/FLU/RSV plus assay is intended as an aid in the diagnosis of influenza from Nasopharyngeal swab specimens and should not be used as a sole basis for treatment. Nasal washings and aspirates are unacceptable for Xpert Xpress SARS-CoV-2/FLU/RSV testing.  Fact Sheet for Patients: [place]  Fact Sheet for Healthcare Providers: BloggerCourse.com  This test is not yet approved or cleared by the [employer] FDA and has been authorized for detection and/or diagnosis of SARS-CoV-2 by FDA under an Emergency Use Authorization (EUA). This EUA will remain in effect (meaning this test can be used) for the duration  of the COVID-19 declaration under Section 564(b)(1) of the Act, 21 U.S.C. section 360bbb-3(b)(1), unless the authorization is terminated or revoked.  Performed at [facility], [address], [place], [place] [address]       Studies: MR BRAIN WO CONTRAST  Result Date: 09/13/2020 CLINICAL DATA:  Initial evaluation for acute psychosis. EXAM: MRI HEAD WITHOUT CONTRAST TECHNIQUE: Multiplanar, multiecho pulse sequences of the brain and surrounding structures were obtained without intravenous contrast. COMPARISON:  Previous brain MRI from 08/13/2012. FINDINGS: Brain: Cerebral volume within normal limits for age. A few scattered punctate foci of FLAIR hyperintensity noted within the periventricular white matter of both frontal lobes, nonspecific, but felt to be within normal limits for age and of doubtful significance. Suspected small left frontal DVA noted as well. No abnormal foci of restricted diffusion to suggest acute or subacute ischemia. Gray-white matter differentiation maintained. No  encephalomalacia to suggest chronic cortical infarction. No foci of susceptibility artifact to suggest acute or chronic intracranial hemorrhage. No mass lesion, midline shift or mass effect. Ventricles normal size without hydrocephalus. No extra-axial fluid collection. Pituitary gland suprasellar region within normal limits. Midline structures intact and normal. Vascular: Major intracranial vascular flow voids are well maintained and normal in appearance. Skull and upper cervical spine: Craniocervical junction normal. Bone marrow signal intensity within normal limits. No focal marrow replacing lesion. No scalp soft tissue abnormality. Sinuses/Orbits: Globes and orbital soft tissues within normal limits. Mild scattered mucosal thickening noted within the ethmoidal air cells. Paranasal sinuses are otherwise largely clear. No significant mastoid effusion. Inner ear structures within normal limits. Other: None. IMPRESSION: Normal brain MRI for age. No acute intracranial abnormality identified. Electronically Signed   By: Rise Mu M.D.   On: 09/13/2020 22:30    Scheduled Meds: . enoxaparin (LOVENOX) injection  0.5 mg/kg Subcutaneous Q24H  . potassium chloride  40 mEq Oral BID  . sodium chloride flush  3 mL Intravenous Q12H    Continuous Infusions: . lactated ringers 100 mL/hr at 09/14/20 0858     LOS: 0 days     Darlin Drop, MD [facility] Pager [phone]  If 7PM-7AM, please contact night-coverage [email or web address] Password [email or web address] 09/14/2020, 1:26 PM

## 2020-09-14 NOTE — Consult Note (Signed)
Connor [name] Face-to-Face Psychiatry Consult   Reason for Consult: Follow-up for this 20 year old man who was sent to the medical service from the emergency room last night because of complications related to a dystonic reaction Referring Physician: Margo [name] Patient Identification: Connor [name] MRN:  [id number] Principal Diagnosis: Delusional disorder [facility]) Diagnosis:  Principal Problem:   Delusional disorder (HCC) Active Problems:   Neurofibromatosis, type I (von Recklinghausen's disease) (HCC)   Somatic symptom disorder   Acute dystonic reaction due to drugs   Total Time spent with patient: 45 minutes  Subjective:   Connor [name] is a 20 y.o. male patient admitted with "my tongue is swollen".  HPI: Patient seen chart reviewed.  Patient yesterday evening had an abrupt dystonic reaction with torticollis that ultimately resolved after a dose of Benadryl.  He was transferred to the medical service because of elevated creatinine kinase and tachycardia.  On evaluation today the patient is mostly looking back to normal.  Alert and awake.  Interactive.  Still tachycardic but normal temperature.  Not diaphoretic.  Able to move all extremities eyes and face with no sign of any stiffness.  Patient tells me that his tongue feels swollen.  He has the mass of his tongue between his lips and is drooling and it makes it difficult for him to talk although when I told him to go very slowly and carefully he was able to talk articulately.  Continues to believe that his body is burning and that it is being caused by electric fields.  Past Psychiatric History: Patient has a history of autistic spectrum disorder and ADHD and most recently a severe delusional obsession with odd somatic symptoms  Risk to Self:   Risk to Others:   Prior Inpatient Therapy:   Prior Outpatient Therapy:    Past Medical History:  Past Medical History:  Diagnosis Date  . ADHD (attention deficit hyperactivity  disorder)   . Autism   . Depressed   . Neurofibromatosis (HCC)    Type 1  . Obesity   . Pertussis    as a infant    Past Surgical History:  Procedure Laterality Date  . MRI    . RADIOLOGY WITH ANESTHESIA N/A 08/13/2012   Procedure: RADIOLOGY WITH ANESTHESIA;  Surgeon: Medication Radiologist, MD;  Location: [facility] OR;  Service: Radiology;  Laterality: N/A;  MRI    Family History:  Family History  Problem Relation Age of Onset  . Anxiety disorder Mother   . Depression Mother   . OCD Mother   . Hypertension Mother   . Cancer Maternal Aunt   . Cancer Maternal Grandmother   . Hypertension Father   . Schizophrenia Maternal Uncle    Family Psychiatric  History: See previous Social History:  Social History   Substance and Sexual Activity  Alcohol Use No  . Alcohol/week: 0.0 standard drinks     Social History   Substance and Sexual Activity  Drug Use No    Social History   Socioeconomic History  . Marital status: Single    Spouse name: Not on file  . Number of children: Not on file  . Years of education: Not on file  . Highest education level: Not on file  Occupational History  . Not on file  Tobacco Use  . Smoking status: Former Smoker    Packs/day: 1.00    Years: 1.00    Pack years: 1.00    Types: Cigarettes    Quit date: 12/31/2014  Years since quitting: 5.7  . Smokeless tobacco: Never Used  Substance and Sexual Activity  . Alcohol use: No    Alcohol/week: 0.0 standard drinks  . Drug use: No  . Sexual activity: Not Currently    Birth control/protection: None  Other Topics Concern  . Not on file  Social History Narrative   Connor [name] is a high school drop out.   He lives with his mom only. He has one sister.   He enjoys eating, sleeping, and watching tv.   Social Determinants of Health   Financial Resource Strain: Not on file  Food Insecurity: Not on file  Transportation Needs: Not on file  Physical Activity: Not on file  Stress: Not on file  Social  Connections: Not on file   Additional Social History:    Allergies:   Allergies  Allergen Reactions  . Haldol [Haloperidol] Other (See Comments)    Dystonia      Labs:  Results for orders placed or performed during the hospital encounter of 09/12/20 (from the past 48 hour(s))  CBC with Differential     Status: Abnormal   Collection Time: 09/13/20  6:00 PM  Result Value Ref Range   WBC 16.8 (H) 4.0 - 10.5 K/uL   RBC 5.89 (H) 4.22 - 5.81 MIL/uL   Hemoglobin 17.2 (H) 13.0 - 17.0 g/dL   HCT 16.151.7 09.639.0 - 04.552.0 %   MCV 87.8 80.0 - 100.0 fL   MCH 29.2 26.0 - 34.0 pg   MCHC 33.3 30.0 - 36.0 g/dL   RDW 40.913.8 81.111.5 - 91.415.5 %   Platelets 428 (H) 150 - 400 K/uL   nRBC 0.0 0.0 - 0.2 %   Neutrophils Relative % 74 %   Neutro Abs 12.6 (H) 1.7 - 7.7 K/uL   Lymphocytes Relative 17 %   Lymphs Abs 2.8 0.7 - 4.0 K/uL   Monocytes Relative 8 %   Monocytes Absolute 1.3 (H) 0.1 - 1.0 K/uL   Eosinophils Relative 0 %   Eosinophils Absolute 0.0 0.0 - 0.5 K/uL   Basophils Relative 0 %   Basophils Absolute 0.1 0.0 - 0.1 K/uL   Immature Granulocytes 1 %   Abs Immature Granulocytes 0.10 (H) 0.00 - 0.07 K/uL    Comment: Performed at [facility], [address] [place], [place] [address]  Comprehensive metabolic panel     Status: Abnormal   Collection Time: 09/13/20  6:00 PM  Result Value Ref Range   Sodium 138 135 - 145 mmol/L   Potassium 3.8 3.5 - 5.1 mmol/L   Chloride 103 98 - 111 mmol/L   CO2 16 (L) 22 - 32 mmol/L   Glucose, Bld 150 (H) 70 - 99 mg/dL    Comment: Glucose reference range applies only to samples taken after fasting for at least 8 hours.   BUN 9 6 - 20 mg/dL   Creatinine, Ser 6.211.22 0.61 - 1.24 mg/dL   Calcium 9.8 8.9 - 30.810.3 mg/dL   Total Protein 8.8 (H) 6.5 - 8.1 g/dL   Albumin 5.5 (H) 3.5 - 5.0 g/dL   AST 59 (H) 15 - 41 U/L   ALT 31 0 - 44 U/L   Alkaline Phosphatase 74 38 - 126 U/L   Total Bilirubin 0.7 0.3 - 1.2 mg/dL   GFR, Estimated >65>60 >78>60 mL/min    Comment:  (NOTE) Calculated using the CKD-EPI Creatinine Equation (2021)    Anion gap 19 (H) 5 - 15    Comment: Performed at [facility], [address] [place], [place] [address]  CK     Status: Abnormal   Collection Time: 09/13/20  6:00 PM  Result Value Ref Range   Total CK 1,259 (H) 49 - 397 U/L    Comment: Performed at [facility], [address] [place], [place] [address]  Magnesium     Status: None   Collection Time: 09/13/20  6:00 PM  Result Value Ref Range   Magnesium 2.1 1.7 - 2.4 mg/dL    Comment: Performed at [facility], [address] [place], [place] [address]  HIV Antibody (routine testing w rflx)     Status: None   Collection Time: 09/14/20  5:09 AM  Result Value Ref Range   HIV Screen 4th Generation wRfx Non Reactive Non Reactive    Comment: Performed at [facility], [address], [place], [place] [address]  CBC     Status: Abnormal   Collection Time: 09/14/20  5:09 AM  Result Value Ref Range   WBC 10.6 (H) 4.0 - 10.5 K/uL   RBC 5.21 4.22 - 5.81 MIL/uL   Hemoglobin 15.5 13.0 - 17.0 g/dL   HCT 84.1 66.0 - 63.0 %   MCV 86.2 80.0 - 100.0 fL   MCH 29.8 26.0 - 34.0 pg   MCHC 34.5 30.0 - 36.0 g/dL   RDW 16.0 10.9 - 32.3 %   Platelets 311 150 - 400 K/uL   nRBC 0.0 0.0 - 0.2 %    Comment: Performed at [name], [address] [place], [place] [address]  Basic metabolic panel     Status: Abnormal   Collection Time: 09/14/20  5:09 AM  Result Value Ref Range   Sodium 139 135 - 145 mmol/L   Potassium 3.1 (L) 3.5 - 5.1 mmol/L   Chloride 104 98 - 111 mmol/L   CO2 24 22 - 32 mmol/L   Glucose, Bld 106 (H) 70 - 99 mg/dL    Comment: Glucose reference range applies only to samples taken after fasting for at least 8 hours.   BUN 7 6 - 20 mg/dL   Creatinine, Ser 2.02 0.61 - 1.24 mg/dL   Calcium 9.0 8.9 - 54.2 mg/dL   GFR, Estimated >70 >62 mL/min    Comment: (NOTE) Calculated using the CKD-EPI Creatinine Equation (2021)     Anion gap 11 5 - 15    Comment: Performed at [name], [address] [place], [place] [address]  CK     Status: Abnormal   Collection Time: 09/14/20  5:09 AM  Result Value Ref Range   Total CK 873 (H) 49 - 397 U/L    Comment: Performed at [name], [address], [place], [place] [address]    Current Facility-Administered Medications  Medication Dose Route Frequency Provider Last Rate Last Admin  . acetaminophen (TYLENOL) tablet 650 mg  650 mg Oral Q6H PRN Charlsie Quest, MD       Or  . acetaminophen (TYLENOL) suppository 650 mg  650 mg Rectal Q6H PRN Darreld Mclean R, MD      . diphenhydrAMINE (BENADRYL) injection 50 mg  50 mg Intramuscular Q2H PRN Kathrine Rieves, Jackquline Denmark, MD   50 mg at 09/13/20 2022  . diphenhydrAMINE (BENADRYL) injection 50 mg  50 mg Intramuscular Once Marla Pouliot T, MD      . enoxaparin (LOVENOX) injection 60 mg  0.5 mg/kg Subcutaneous Q24H Ronnald Ramp, RPH      .  lactated ringers infusion   Intravenous Continuous Darlin Drop, DO 100 mL/hr at 09/14/20 0858 New Bag at 09/14/20 0858  . LORazepam (ATIVAN) injection 2 mg  2 mg Intramuscular Q6H PRN Jobeth Pangilinan, Jackquline Denmark, MD   2 mg at 09/14/20 0516  . metoprolol tartrate (LOPRESSOR) injection 2.5 mg  2.5 mg Intravenous Q4H PRN Dow Adolph N, DO   2.5 mg at 09/14/20 1031  . potassium chloride SA (KLOR-CON) CR tablet 40 mEq  40 mEq Oral BID Dow Adolph N, DO   40 mEq at 09/14/20 0859  . sodium chloride flush (NS) 0.9 % injection 3 mL  3 mL Intravenous Q12H Charlsie Quest, MD   3 mL at 09/14/20 1031    Musculoskeletal: Strength & Muscle Tone: within normal limits Gait & Station: normal Patient leans: N/A            Psychiatric Specialty Exam:  Presentation  General Appearance: No data recorded Eye Contact:No data recorded Speech:No data recorded Speech Volume:No data recorded Handedness:No data recorded  Mood and Affect  Mood:No data recorded Affect:No data  recorded  Thought Process  Thought Processes:No data recorded Descriptions of Associations:No data recorded Orientation:No data recorded Thought Content:No data recorded History of Schizophrenia/Schizoaffective disorder:No  Duration of Psychotic Symptoms:Less than six months  Hallucinations:No data recorded Ideas of Reference:No data recorded Suicidal Thoughts:No data recorded Homicidal Thoughts:No data recorded  Sensorium  Memory:No data recorded Judgment:No data recorded Insight:No data recorded  Executive Functions  Concentration:No data recorded Attention Span:No data recorded Recall:No data recorded Fund of Knowledge:No data recorded Language:No data recorded  Psychomotor Activity  Psychomotor Activity:No data recorded  Assets  Assets:No data recorded  Sleep  Sleep:No data recorded  Physical Exam: Physical Exam Vitals and nursing note reviewed.  Constitutional:      Appearance: Normal appearance.  HENT:     Head: Normocephalic and atraumatic.     Mouth/Throat:     Pharynx: Oropharynx is clear.     Comments: Patient is complaining of his tongue feeling "swollen".  It is very difficult to examine and know whether tongue is actually swollen, but this is very much the typical complaint made by people having dystonic reactions Eyes:     Pupils: Pupils are equal, round, and reactive to light.  Cardiovascular:     Rate and Rhythm: Normal rate and regular rhythm.  Pulmonary:     Effort: Pulmonary effort is normal.     Breath sounds: Normal breath sounds.  Abdominal:     General: Abdomen is flat.     Palpations: Abdomen is soft.  Musculoskeletal:        General: Normal range of motion.  Skin:    General: Skin is warm and dry.  Neurological:     General: No focal deficit present.     Mental Status: He is alert. Mental status is at baseline.  Psychiatric:        Attention and Perception: Attention normal.        Mood and Affect: Affect is blunt.         Speech: Speech is delayed.        Behavior: Behavior is agitated. Behavior is not aggressive or hyperactive.        Thought Content: Thought content is delusional. Thought content does not include homicidal or suicidal ideation.        Cognition and Memory: Cognition normal.        Judgment: Judgment is impulsive.    Review of Systems  Constitutional: Negative.   HENT: Negative.   Eyes: Negative.   Respiratory: Negative.   Cardiovascular: Negative.   Gastrointestinal: Negative.   Musculoskeletal: Negative.   Skin: Negative.   Neurological: Negative.   Psychiatric/Behavioral: The patient is nervous/anxious.    Blood pressure (!) 137/99, pulse (!) 107, temperature 99.5 F (37.5 C), resp. rate 18, height 6\' 4"  (1.93 m), weight 120.8 kg, SpO2 99 %. Body mass index is 32.42 kg/m.  Treatment Plan Summary: Medication management and Plan 20 year old man with a still not perfectly diagnosed but clearly distressing mental health condition of delusional level obsession with his nonphysiologic somatic symptoms.  When I visited him today he was essentially back to his previous baseline with the exception of his complaint that his tongue was swollen.  His mental condition seems about the same although less agitated than it sometimes was in the last couple days.  I have put in an order for him to get a single dose of 50 mg of Benadryl IM now.  There is a very good chance that this will relieve his sensation of a swollen tongue because that is atypical symptom described by people having dystonic reactions.  He may just be continuing to have this lingering symptom.  I suppose it is also possible he is having an allergic reaction to something in his mouth in which case the Benadryl will help as well.  I think he still needs to go to the psychiatric ward.  We have no beds today unfortunately.  I will contact his mother and update her as he has requested.  I ordered an MRI of his brain last night at the request  of his mother because of his history of neurofibromatosis type I.  Reviewed the result which was read as essentially normal.  I went by radiology today and asked the radiology attending staffing to review it for me in light of the patient's diagnosis of neurofibromatosis.  He felt this did not change the conclusion that it was a normal MRI.  Obviously do not give the patient any antipsychotic medication currently.  Disposition: Recommend psychiatric Inpatient admission when medically cleared. Supportive therapy provided about ongoing stressors.  19, MD 09/14/2020 4:42 PM

## 2020-09-14 NOTE — ED Notes (Signed)
Secure chat Darlin Drop, DO regarding pt ranging tachycardia 120-150's this morning.

## 2020-09-14 NOTE — Progress Notes (Signed)
   09/14/20 0842  Assess: MEWS Score  Temp 99.4 F (37.4 C)  BP (!) 148/100  Pulse Rate (!) 139  ECG Heart Rate (!) 139  Resp 18  Level of Consciousness Alert  SpO2 98 %  O2 Device Room Air  Assess: MEWS Score  MEWS Temp 0  MEWS Systolic 0  MEWS Pulse 3  MEWS RR 0  MEWS LOC 0  MEWS Score 3  MEWS Score Color Yellow  Assess: if the MEWS score is Yellow or Red  Were vital signs taken at a resting state? Yes  Focused Assessment No change from prior assessment  Early Detection of Sepsis Score *See Row Information* Low  MEWS guidelines implemented *See Row Information* Yes  Treat  MEWS Interventions Administered prn meds/treatments  Pain Scale 0-10  Pain Score 10  Take Vital Signs  Increase Vital Sign Frequency  Yellow: Q 2hr X 2 then Q 4hr X 2, if remains yellow, continue Q 4hrs  Escalate  MEWS: Escalate Yellow: discuss with charge nurse/RN and consider discussing with provider and RRT  Notify: Charge Nurse/RN  Name of Charge Nurse/RN Notified Jessica, RN  Date Charge Nurse/RN Notified 09/14/20  Time Charge Nurse/RN Notified 1038  Notify: Provider  Provider Name/Title Dow Adolph  Date Provider Notified 09/14/20  Time Provider Notified [phone]  Notification Type Page  Notification Reason Other (Comment)  Provider response See new orders  Date of Provider Response 09/14/20  Time of Provider Response 0930

## 2020-09-15 DIAGNOSIS — F22 Delusional disorders: Secondary | ICD-10-CM | POA: Diagnosis not present

## 2020-09-15 LAB — CBC
HCT: 45 % (ref 39.0–52.0)
Hemoglobin: 15.2 g/dL (ref 13.0–17.0)
MCH: 29.7 pg (ref 26.0–34.0)
MCHC: 33.8 g/dL (ref 30.0–36.0)
MCV: 87.9 fL (ref 80.0–100.0)
Platelets: 266 10*3/uL (ref 150–400)
RBC: 5.12 MIL/uL (ref 4.22–5.81)
RDW: 13.9 % (ref 11.5–15.5)
WBC: 7.6 10*3/uL (ref 4.0–10.5)
nRBC: 0 % (ref 0.0–0.2)

## 2020-09-15 LAB — BASIC METABOLIC PANEL
Anion gap: 7 (ref 5–15)
BUN: 6 mg/dL (ref 6–20)
CO2: 26 mmol/L (ref 22–32)
Calcium: 9.2 mg/dL (ref 8.9–10.3)
Chloride: 104 mmol/L (ref 98–111)
Creatinine, Ser: 0.73 mg/dL (ref 0.61–1.24)
GFR, Estimated: >60 mL/min (ref >60)
Glucose, Bld: 91 mg/dL (ref 70–99)
Potassium: 4 mmol/L (ref 3.5–5.1)
Sodium: 137 mmol/L (ref 135–145)

## 2020-09-15 LAB — CK: Total CK: 583 U/L — ABNORMAL HIGH (ref 49–397)

## 2020-09-15 MED ORDER — KETOROLAC TROMETHAMINE 30 MG/ML IJ SOLN
30.0000 mg | Freq: Once | INTRAMUSCULAR | Status: AC
  Administered 2020-09-15: 30 mg via INTRAVENOUS
  Filled 2020-09-15: qty 1

## 2020-09-15 MED ORDER — IBUPROFEN 400 MG PO TABS
600.0000 mg | ORAL_TABLET | Freq: Four times a day (QID) | ORAL | Status: DC | PRN
  Administered 2020-09-15 (×2): 600 mg via ORAL
  Filled 2020-09-15 (×3): qty 2

## 2020-09-15 MED ORDER — ADULT MULTIVITAMIN W/MINERALS CH
1.0000 | ORAL_TABLET | Freq: Every day | ORAL | Status: DC
  Administered 2020-09-15 – 2020-09-18 (×4): 1 via ORAL
  Filled 2020-09-15 (×4): qty 1

## 2020-09-15 MED ORDER — PANTOPRAZOLE SODIUM 40 MG PO TBEC
40.0000 mg | DELAYED_RELEASE_TABLET | Freq: Every day | ORAL | Status: DC
  Administered 2020-09-15 – 2020-09-18 (×4): 40 mg via ORAL
  Filled 2020-09-15 (×4): qty 1

## 2020-09-15 MED ORDER — SUCRALFATE 1 GM/10ML PO SUSP
1.0000 g | Freq: Four times a day (QID) | ORAL | Status: DC | PRN
  Administered 2020-09-16 – 2020-09-17 (×3): 1 g via ORAL
  Filled 2020-09-15 (×3): qty 10

## 2020-09-15 MED ORDER — LORAZEPAM 2 MG/ML IJ SOLN
2.0000 mg | INTRAMUSCULAR | Status: DC | PRN
  Administered 2020-09-15 – 2020-09-18 (×8): 2 mg via INTRAVENOUS
  Filled 2020-09-15 (×8): qty 1

## 2020-09-15 MED ORDER — SENNA 8.6 MG PO TABS
2.0000 | ORAL_TABLET | Freq: Two times a day (BID) | ORAL | Status: DC
  Administered 2020-09-15 – 2020-09-18 (×6): 17.2 mg via ORAL
  Filled 2020-09-15 (×7): qty 2

## 2020-09-15 MED ORDER — ALUM & MAG HYDROXIDE-SIMETH 200-200-20 MG/5ML PO SUSP
30.0000 mL | Freq: Four times a day (QID) | ORAL | Status: DC | PRN
  Administered 2020-09-17: 30 mL via ORAL
  Filled 2020-09-15: qty 30

## 2020-09-15 MED ORDER — ENSURE ENLIVE PO LIQD
237.0000 mL | Freq: Three times a day (TID) | ORAL | Status: DC
  Administered 2020-09-15 – 2020-09-18 (×7): 237 mL via ORAL

## 2020-09-15 MED ORDER — POLYETHYLENE GLYCOL 3350 17 G PO PACK
17.0000 g | PACK | Freq: Every day | ORAL | Status: DC
  Administered 2020-09-15 – 2020-09-18 (×3): 17 g via ORAL
  Filled 2020-09-15 (×4): qty 1

## 2020-09-15 NOTE — Progress Notes (Signed)
°   09/14/20 2300  Assess: MEWS Score  ECG Heart Rate 87  Resp 19  Level of Consciousness Alert  Assess: MEWS Score  MEWS Temp 0  MEWS Systolic 0  MEWS Pulse 0  MEWS RR 0  MEWS LOC 0  MEWS Score 0  MEWS Score Color Green  Unable to take full VS due to patient just fall asleep from agitation and anxious. Will obtain another VS in the am when patient awake. Consulting civil engineer notified

## 2020-09-15 NOTE — Progress Notes (Signed)
Initial Nutrition Assessment  DOCUMENTATION CODES:   Obesity unspecified  INTERVENTION:   -Ensure Enlive po TID, each supplement provides 350 kcal and 20 grams of protein -MVI with minerals daily -Magic cup TID with meals, each supplement provides 290 kcal and 9 grams of protein  NUTRITION DIAGNOSIS:   Inadequate oral intake related to decreased appetite as evidenced by meal completion < 25%.  GOAL:   Patient will meet greater than or equal to 90% of their needs  MONITOR:   PO intake,Supplement acceptance,Labs,Weight trends,Skin,I & O's  REASON FOR ASSESSMENT:   Malnutrition Screening Tool    ASSESSMENT:   Connor [name] is a 20 y.o. male with medical history significant for Autism spectrum disorder, delusional disorder, neurofibromatosis who was brought to the ED on 09/12/2020 with delusions, anxiety, and aggressive behavior.  He was reported to have expressed intention to cause physical harm to his mother as well as suicidal statements.  He was seen by psychiatry and placed under IVC with plan to admit to inpatient psychiatric ward pending bed availability.  Pt admitted with acute dystonic reaction with elevated CK.  Reviewed I/O's: +1.1 L x 24 hours  Spoke with pt and mother at bedside. Per pt mother, pt has experienced a general decline in health since November after being followed with GI as an outpatient. Pt shares that he has no appetite and feels constipated- he last had a "good BM" about 2 months ago. Mother reports pt has been tried on numerous laxatives and miralax and lactulose work best for him.   Pt has not eaten anything today other than a few bites of fruit and Ensure. Mom brought chick fil a yesterday, which pt refused. He also refused his lunch tray. Per reports "the radiation has messed up my intestines".    Reviewed wt hx; pt has experienced a 8.1% wt loss over the past month, which is significant for time frame. Per mom, pt has lost about 50  pounds in the past 5 months.   Discussed importance of good meal and supplement intake to promote healing. He is amenable to Ensure Enlive, Magic Cups, and bowel regimen.   Medications reviewed and miralax, senokot, and lactated ringers infusion @ 100 ml/hr.   Labs reviewed.   NUTRITION - FOCUSED PHYSICAL EXAM:  Flowsheet Row Most Recent Value  Orbital Region No depletion  Upper Arm Region No depletion  Thoracic and Lumbar Region No depletion  Buccal Region No depletion  Temple Region No depletion  Clavicle Bone Region No depletion  Clavicle and Acromion Bone Region No depletion  Scapular Bone Region No depletion  Dorsal Hand No depletion  Patellar Region No depletion  Anterior Thigh Region No depletion  Posterior Calf Region No depletion  Edema (RD Assessment) None  Hair Reviewed  Eyes Reviewed  Mouth Reviewed  Skin Reviewed  Nails Reviewed       Diet Order:   Diet Order            Diet regular Room service appropriate? Yes; Fluid consistency: Thin  Diet effective now                 EDUCATION NEEDS:   Education needs have been addressed  Skin:  Skin Assessment: Reviewed RN Assessment  Last BM:  09/13/20  Height:   Ht Readings from Last 1 Encounters:  09/14/20 6\' 4"  (1.93 m)    Weight:   Wt Readings from Last 1 Encounters:  09/14/20 120.8 kg    Ideal Body  Weight:  91.8 kg  BMI:  Body mass index is 32.42 kg/m.  Estimated Nutritional Needs:   Kcal:  2300-2500  Protein:  120-135 grams  Fluid:  > 2.3 L    Levada Schilling, RD, LDN, CDCES Registered Dietitian II Certified Diabetes Care and Education Specialist Please refer to [facility] for RD and/or RD on-call/weekend/after hours pager

## 2020-09-15 NOTE — Consult Note (Signed)
[facility] Face-to-Face Psychiatry Consult   Reason for Consult: Follow-up consult for this 20 year old man who was admitted to the medical service after a dystonic reaction. Referring Physician: Margo Aye Patient Identification: Connor [name] MRN:  [id number] Principal Diagnosis: Delusional disorder [facility]) Diagnosis:  Principal Problem:   Delusional disorder (HCC) Active Problems:   Neurofibromatosis, type I (von Recklinghausen's disease) (HCC)   Somatic symptom disorder   Acute dystonic reaction due to drugs   Total Time spent with patient: 30 minutes  Subjective:   Connor [name] is a 20 y.o. male patient admitted with "I guess I am better".  HPI: Patient seen chart reviewed.  When I came up to see him this afternoon he was calm but awake.  Able to cooperate with nursing.  Not screaming.  He tells me that the burning feeling all over his body is still terrible and exactly the same as it always is but is not acting distressed.  I pointed out to him that it was interesting that sometimes he was terribly distressed and sometimes he was not even though he claims the pain is exactly the same all the time.  Patient was not able to really see the difference I do not think.  Earlier today he got himself ramped up and was shouting about radiation again but got a little Ativan and has calm down.  Still believes that he has radiation poisoning.  As far as the dystonic reaction it appears to be completely resolved.  He is talking normally and does not have a sensation of a swollen tongue and there is no stiffness or movement disorder noted.  Past Psychiatric History: Past history of a couple months of escalating delusional behavior prior to that anxiety problems  Risk to Self:   Risk to Others:   Prior Inpatient Therapy:   Prior Outpatient Therapy:    Past Medical History:  Past Medical History:  Diagnosis Date  . ADHD (attention deficit hyperactivity disorder)   . Autism   .  Depressed   . Neurofibromatosis (HCC)    Type 1  . Obesity   . Pertussis    as a infant    Past Surgical History:  Procedure Laterality Date  . MRI    . RADIOLOGY WITH ANESTHESIA N/A 08/13/2012   Procedure: RADIOLOGY WITH ANESTHESIA;  Surgeon: Medication Radiologist, MD;  Location: [facility] OR;  Service: Radiology;  Laterality: N/A;  MRI    Family History:  Family History  Problem Relation Age of Onset  . Anxiety disorder Mother   . Depression Mother   . OCD Mother   . Hypertension Mother   . Cancer Maternal Aunt   . Cancer Maternal Grandmother   . Hypertension Father   . Schizophrenia Maternal Uncle    Family Psychiatric  History: See previous Social History:  Social History   Substance and Sexual Activity  Alcohol Use No  . Alcohol/week: 0.0 standard drinks     Social History   Substance and Sexual Activity  Drug Use No    Social History   Socioeconomic History  . Marital status: Single    Spouse name: Not on file  . Number of children: Not on file  . Years of education: Not on file  . Highest education level: Not on file  Occupational History  . Not on file  Tobacco Use  . Smoking status: Former Smoker    Packs/day: 1.00    Years: 1.00    Pack years: 1.00  Types: Cigarettes    Quit date: 12/31/2014    Years since quitting: 5.7  . Smokeless tobacco: Never Used  Substance and Sexual Activity  . Alcohol use: No    Alcohol/week: 0.0 standard drinks  . Drug use: No  . Sexual activity: Not Currently    Birth control/protection: None  Other Topics Concern  . Not on file  Social History Narrative   Connor [name] is a high school drop out.   He lives with his mom only. He has one sister.   He enjoys eating, sleeping, and watching tv.   Social Determinants of Health   Financial Resource Strain: Not on file  Food Insecurity: Not on file  Transportation Needs: Not on file  Physical Activity: Not on file  Stress: Not on file  Social Connections: Not on file    Additional Social History:    Allergies:   Allergies  Allergen Reactions  . Haldol [Haloperidol] Other (See Comments)    Dystonia      Labs:  Results for orders placed or performed during the hospital encounter of 09/12/20 (from the past 48 hour(s))  CBC with Differential     Status: Abnormal   Collection Time: 09/13/20  6:00 PM  Result Value Ref Range   WBC 16.8 (H) 4.0 - 10.5 K/uL   RBC 5.89 (H) 4.22 - 5.81 MIL/uL   Hemoglobin 17.2 (H) 13.0 - 17.0 g/dL   HCT 97.6 73.4 - 19.3 %   MCV 87.8 80.0 - 100.0 fL   MCH 29.2 26.0 - 34.0 pg   MCHC 33.3 30.0 - 36.0 g/dL   RDW 79.0 24.0 - 97.3 %   Platelets 428 (H) 150 - 400 K/uL   nRBC 0.0 0.0 - 0.2 %   Neutrophils Relative % 74 %   Neutro Abs 12.6 (H) 1.7 - 7.7 K/uL   Lymphocytes Relative 17 %   Lymphs Abs 2.8 0.7 - 4.0 K/uL   Monocytes Relative 8 %   Monocytes Absolute 1.3 (H) 0.1 - 1.0 K/uL   Eosinophils Relative 0 %   Eosinophils Absolute 0.0 0.0 - 0.5 K/uL   Basophils Relative 0 %   Basophils Absolute 0.1 0.0 - 0.1 K/uL   Immature Granulocytes 1 %   Abs Immature Granulocytes 0.10 (H) 0.00 - 0.07 K/uL    Comment: Performed at [facility], [address] [place], [place] [address]  Comprehensive metabolic panel     Status: Abnormal   Collection Time: 09/13/20  6:00 PM  Result Value Ref Range   Sodium 138 135 - 145 mmol/L   Potassium 3.8 3.5 - 5.1 mmol/L   Chloride 103 98 - 111 mmol/L   CO2 16 (L) 22 - 32 mmol/L   Glucose, Bld 150 (H) 70 - 99 mg/dL    Comment: Glucose reference range applies only to samples taken after fasting for at least 8 hours.   BUN 9 6 - 20 mg/dL   Creatinine, Ser 2.42 0.61 - 1.24 mg/dL   Calcium 9.8 8.9 - 68.3 mg/dL   Total Protein 8.8 (H) 6.5 - 8.1 g/dL   Albumin 5.5 (H) 3.5 - 5.0 g/dL   AST 59 (H) 15 - 41 U/L   ALT 31 0 - 44 U/L   Alkaline Phosphatase 74 38 - 126 U/L   Total Bilirubin 0.7 0.3 - 1.2 mg/dL   GFR, Estimated >41 >96 mL/min    Comment: (NOTE) Calculated using the  CKD-EPI Creatinine Equation (2021)    Anion gap 19 (  H) 5 - 15    Comment: Performed at [facility], [address] [place], [place] [address]  CK     Status: Abnormal   Collection Time: 09/13/20  6:00 PM  Result Value Ref Range   Total CK 1,259 (H) 49 - 397 U/L    Comment: Performed at [facility], [address] [place], [place] [address]  Magnesium     Status: None   Collection Time: 09/13/20  6:00 PM  Result Value Ref Range   Magnesium 2.1 1.7 - 2.4 mg/dL    Comment: Performed at [facility], [address] [place], [place] [address]  HIV Antibody (routine testing w rflx)     Status: None   Collection Time: 09/14/20  5:09 AM  Result Value Ref Range   HIV Screen 4th Generation wRfx Non Reactive Non Reactive    Comment: Performed at [facility], [address] [place], [place] [address]  CBC     Status: Abnormal   Collection Time: 09/14/20  5:09 AM  Result Value Ref Range   WBC 10.6 (H) 4.0 - 10.5 K/uL   RBC 5.21 4.22 - 5.81 MIL/uL   Hemoglobin 15.5 13.0 - 17.0 g/dL   HCT 93.7 90.2 - 40.9 %   MCV 86.2 80.0 - 100.0 fL   MCH 29.8 26.0 - 34.0 pg   MCHC 34.5 30.0 - 36.0 g/dL   RDW 73.5 32.9 - 92.4 %   Platelets 311 150 - 400 K/uL   nRBC 0.0 0.0 - 0.2 %    Comment: Performed at [facility], [address] [place], [place] [address]  Basic metabolic panel     Status: Abnormal   Collection Time: 09/14/20  5:09 AM  Result Value Ref Range   Sodium 139 135 - 145 mmol/L   Potassium 3.1 (L) 3.5 - 5.1 mmol/L   Chloride 104 98 - 111 mmol/L   CO2 24 22 - 32 mmol/L   Glucose, Bld 106 (H) 70 - 99 mg/dL    Comment: Glucose reference range applies only to samples taken after fasting for at least 8 hours.   BUN 7 6 - 20 mg/dL   Creatinine, Ser 1.96 0.61 - 1.24 mg/dL   Calcium 9.0 8.9 - 22.2 mg/dL   GFR, Estimated >97 >98 mL/min    Comment: (NOTE) Calculated using the CKD-EPI Creatinine Equation (2021)    Anion gap 11 5 - 15     Comment: Performed at [facility], [address] [place], [place] [address]  CK     Status: Abnormal   Collection Time: 09/14/20  5:09 AM  Result Value Ref Range   Total CK 873 (H) 49 - 397 U/L    Comment: Performed at [facility], [address] [place], [place] [address]  CBC     Status: None   Collection Time: 09/15/20  6:47 AM  Result Value Ref Range   WBC 7.6 4.0 - 10.5 K/uL   RBC 5.12 4.22 - 5.81 MIL/uL   Hemoglobin 15.2 13.0 - 17.0 g/dL   HCT 81.4 48.1 - 85.6 %   MCV 87.9 80.0 - 100.0 fL   MCH 29.7 26.0 - 34.0 pg   MCHC 33.8 30.0 - 36.0 g/dL   RDW 31.4 97.0 - 26.3 %   Platelets 266 150 - 400 K/uL   nRBC 0.0 0.0 - 0.2 %    Comment: Performed at [facility], [address]  [place], [place] [address]  Basic metabolic panel     Status: None   Collection Time: 09/15/20  6:47 AM  Result Value Ref Range   Sodium 137 135 - 145 mmol/L   Potassium 4.0 3.5 - 5.1 mmol/L   Chloride 104 98 - 111 mmol/L   CO2 26 22 - 32 mmol/L   Glucose, Bld 91 70 - 99 mg/dL    Comment: Glucose reference range applies only to samples taken after fasting for at least 8 hours.   BUN 6 6 - 20 mg/dL   Creatinine, Ser 5.28 0.61 - 1.24 mg/dL   Calcium 9.2 8.9 - 41.3 mg/dL   GFR, Estimated >24 >40 mL/min    Comment: (NOTE) Calculated using the CKD-EPI Creatinine Equation (2021)    Anion gap 7 5 - 15    Comment: Performed at [facility], [address] [place], [place] [address]  CK     Status: Abnormal   Collection Time: 09/15/20  6:47 AM  Result Value Ref Range   Total CK 583 (H) 49 - 397 U/L    Comment: Performed at [facility], [address] [place], [place] [address]    Current Facility-Administered Medications  Medication Dose Route Frequency Provider Last Rate Last Admin  . acetaminophen (TYLENOL) tablet 650 mg  650 mg Oral Q6H PRN Charlsie Quest, MD       Or  . acetaminophen (TYLENOL) suppository 650 mg  650 mg Rectal Q6H PRN  Charlsie Quest, MD      . alum & mag hydroxide-simeth (MAALOX/MYLANTA) 200-200-20 MG/5ML suspension 30 mL  30 mL Oral Q6H PRN Shanekqua Schaper T, MD      . carbamazepine (TEGRETOL) tablet 200 mg  200 mg Oral BID Eliceo Gladu, Jackquline Denmark, MD   200 mg at 09/15/20 0855  . desvenlafaxine (PRISTIQ) 24 hr tablet 100 mg  100 mg Oral Daily Jihaad Bruschi, Jackquline Denmark, MD   100 mg at 09/15/20 0855  . diphenhydrAMINE (BENADRYL) injection 50 mg  50 mg Intramuscular Q2H PRN Kaci Dillie, Jackquline Denmark, MD   50 mg at 09/14/20 1949  . enoxaparin (LOVENOX) injection 60 mg  0.5 mg/kg Subcutaneous Q24H Ronnald Ramp, RPH   60 mg at 09/14/20 2119  . ibuprofen (ADVIL) tablet 600 mg  600 mg Oral Q6H PRN Kairee Isa, Jackquline Denmark, MD   600 mg at 09/15/20 1449  . lactated ringers infusion   Intravenous Continuous Darlin Drop, DO 100 mL/hr at 09/15/20 1300 New Bag at 09/15/20 1300  . LORazepam (ATIVAN) injection 2 mg  2 mg Intravenous Q4H PRN Kristjan Derner, Jackquline Denmark, MD   2 mg at 09/15/20 1450  . metoprolol tartrate (LOPRESSOR) injection 2.5 mg  2.5 mg Intravenous Q4H PRN Dow Adolph N, DO   2.5 mg at 09/14/20 1031  . pantoprazole (PROTONIX) EC tablet 40 mg  40 mg Oral Daily Keishana Klinger, Jackquline Denmark, MD   40 mg at 09/15/20 1451  . polyethylene glycol (MIRALAX / GLYCOLAX) packet 17 g  17 g Oral Daily Dow Adolph N, DO   17 g at 09/15/20 1450  . senna (SENOKOT) tablet 17.2 mg  2 tablet Oral BID Dow Adolph N, DO   17.2 mg at 09/15/20 1450  . sodium chloride flush (NS) 0.9 % injection 3 mL  3 mL Intravenous Q12H Charlsie Quest, MD   3 mL at 09/15/20 0856  . sucralfate (CARAFATE) 1 GM/10ML suspension 1 g  1 g Oral QID PRN Darlin Drop, DO  Musculoskeletal: Strength & Muscle Tone: within normal limits Gait & Station: normal Patient leans: N/A            Psychiatric Specialty Exam:  Presentation  General Appearance: No data recorded Eye Contact:No data recorded Speech:No data recorded Speech Volume:No data recorded Handedness:No data recorded  Mood  and Affect  Mood:No data recorded Affect:No data recorded  Thought Process  Thought Processes:No data recorded Descriptions of Associations:No data recorded Orientation:No data recorded Thought Content:No data recorded History of Schizophrenia/Schizoaffective disorder:No  Duration of Psychotic Symptoms:Less than six months  Hallucinations:No data recorded Ideas of Reference:No data recorded Suicidal Thoughts:No data recorded Homicidal Thoughts:No data recorded  Sensorium  Memory:No data recorded Judgment:No data recorded Insight:No data recorded  Executive Functions  Concentration:No data recorded Attention Span:No data recorded Recall:No data recorded Fund of Knowledge:No data recorded Language:No data recorded  Psychomotor Activity  Psychomotor Activity:No data recorded  Assets  Assets:No data recorded  Sleep  Sleep:No data recorded  Physical Exam: Physical Exam Vitals and nursing note reviewed.  Constitutional:      Appearance: Normal appearance.  HENT:     Head: Normocephalic and atraumatic.     Mouth/Throat:     Pharynx: Oropharynx is clear.  Eyes:     Pupils: Pupils are equal, round, and reactive to light.  Cardiovascular:     Rate and Rhythm: Normal rate and regular rhythm.  Pulmonary:     Effort: Pulmonary effort is normal.     Breath sounds: Normal breath sounds.  Abdominal:     General: Abdomen is flat.     Palpations: Abdomen is soft.  Musculoskeletal:        General: Normal range of motion.  Skin:    General: Skin is warm and dry.  Neurological:     General: No focal deficit present.     Mental Status: He is alert. Mental status is at baseline.  Psychiatric:        Attention and Perception: Attention normal.        Mood and Affect: Affect is blunt.        Speech: Speech is delayed.        Behavior: Behavior is slowed.        Thought Content: Thought content is delusional. Thought content does not include homicidal or suicidal ideation.         Cognition and Memory: Cognition is impaired.        Judgment: Judgment is impulsive and inappropriate.    Review of Systems  Constitutional: Negative.   HENT: Negative.   Eyes: Negative.   Respiratory: Negative.   Cardiovascular: Negative.   Gastrointestinal: Negative.   Musculoskeletal: Positive for myalgias.  Skin: Negative.   Neurological: Negative.   Psychiatric/Behavioral: Negative for depression, hallucinations, memory loss, substance abuse and suicidal ideas. The patient is not nervous/anxious and does not have insomnia.    Blood pressure (!) 141/96, pulse (!) 114, temperature 98.4 F (36.9 C), temperature source Oral, resp. rate 16, height  (1.93 m), weight 120.8 kg, SpO2 97 %. Body mass index is 32.42 kg/m.  Treatment Plan Summary: Medication management and Plan At least at the moment he seems calm down.  We know from having seen him down in the emergency room that he will wax and wane through the day and still is liable to have spells of getting worked up about "radiation".  I have made sure he has as needed IV Ativan ordered and he is now compliant with taking his Pristiq  and Tegretol.  Unfortunately we do not have bed space available on the psychiatric ward otherwise I would have recommended transfer today.  We will keep an eye on it over the weekend but I have put a consult in recommending he be referred out to psychiatric units as well.  Disposition: Recommend psychiatric Inpatient admission when medically cleared. Supportive therapy provided about ongoing stressors.  Mordecai Rasmussen, MD 09/15/2020 4:49 PM

## 2020-09-15 NOTE — Progress Notes (Signed)
PROGRESS NOTE  Connor [name] ZOX:[id number] DOB: 2001-05-10 DOA: 09/12/2020 PCP: Abram Sander, MD  HPI/Recap of past 24 hours: Connor [name] is a 20 y.o. male with medical history significant for Autism spectrum disorder, delusional disorder, neurofibromatosis who was brought to the ED on 09/12/2020 with delusions, anxiety, and aggressive behavior.  He was reported to have expressed intention to cause physical harm to his mother as well as suicidal statements.  He was seen by psychiatry and placed under IVC with plan to admit to inpatient psychiatric ward pending bed availability.  He was placed on IM Ativan 2 mg every 6 hours as needed for anxiety, IM Haldol 10 mg every 6 hours as needed for agitation, and IM Benadryl every 6 hours as needed to be given with IM Haldol.  He received doses of these at 1735 on 3/22 and again earlier today at 1108.  He did also receive a dose of IM Geodon 20 mg at 0128.  Around 1700 on 09/13/20, nursing found him standing in the bathroom drooling.  Patient stated that he cannot breathe.  He was noted to be diaphoretic and pale.  He had torticolic twisting of his neck towards the left.  Vitals showed tachycardia with rates >160.  EKG was obtained and showed SVT with a rate 184 bpm.  Patient was given 2 mg IM Ativan and 50 mg IM Benadryl.  He was given 1 L LR fluid bolus.  Repeat EKG showed sinus tachycardia with rate down to 142.  He was reevaluated by psychiatry who feel this is a severe dystonic reaction with torticollis and less likely to be neuroleptic malignant syndrome.  Labs were obtained and notable for leukocytosis with WBC 16.8K and CK 1259.  Creatinine 1.22 compared to 0.72 09/05/2020.  Potassium 3.8, bicarb 16.  Given elevated CK and SVT, the hospitalist service was consulted to admit for further evaluation and management.  MRI brain without contrast done on 09/13/2020 revealed normal brain imaging  At time of Dr. Eliane Decree  evaluation, patient is resting supine in bed.  He is awake and alert.  He states he was exposed to radiation via CT scan 71 days ago.  He reports feeling like he has some muscle cramping.  He denies any nausea, vomiting, abdominal pain.  He is moving all extremities equally without rigidity.  Affect is flat.  09/15/20: Seen and examined with his mother at his bedside.  He is alert and delusional.  Seen by psychiatry.  Recommends inpatient psych placement.  Vital signs and labs reviewed.  The patient is medically cleared for discharge to inpatient psych.  TOC consulted to assist with placement.  Updated his mother at bedside.  All questions answered to the best of my ability.  Assessment/Plan: Principal Problem:   Delusional disorder (HCC) Active Problems:   Neurofibromatosis, type I (von Recklinghausen's disease) (HCC)   Somatic symptom disorder   Acute dystonic reaction due to drugs  Acute dystonic reaction with elevated CK: Suspected severe acute dystonic reaction secondary to antipsychotic medication.  He did receive IM Haldol and Geodon within the last 24 hours.  CK >1200.  Psychiatry feel this is less likely to be neuroleptic malignant syndrome. -S/p 1 L LR bolus, will give additional bolus this evening -Check urinalysis, nitrite negative, budding yeast present.   -Monitor urine output -No further neuroleptic medications, Haldol has already been discontinued -Continue IM Benadryl 50 mg q2h prn per psychiatry -Rest of management per psychiatry.  Resolved AKI, likely  prerenal in the setting of dehydration from poor oral intake At baseline creatinine 0.7 with GFR greater than 60 Currently back to his baseline Presented with creatinine 1.2  Avoid nephrotoxic agents Continue IV fluid hydration Monitor urine output  Resolved post IV hydration.  SVT: Transient SVT in setting of acute dystonic reaction.   Has received IV fluid hydration, continue. Continue to monitor on  telemetry.  Resolved leukocytosis, likely reactive Not on antibiotics.  Delusional disorder/psychosis with agitation/aggressive behavior: Psychiatry following, appreciate assistance.   Under IVC with plans for inpatient psychiatry admission once medically stable and bed available. -Continue IV Ativan 2 mg IM q6h as needed anxiety per psychiatry -Avoid further neuroleptics due to severe dystonic reaction Rest of management per psychiatry. Seen by psychiatry.  Recommends inpatient psych placement.  Vital signs and labs reviewed.  The patient is medically cleared for discharge to inpatient psych.  TOC consulted to assist with placement.    DVT prophylaxis: Lovenox subcu daily Code Status: Full code Family Communication:  Updated the mother at bedside on 09/15/2020.    Disposition Plan: From home, dispo to inpatient psychiatry when a bed is available. Consults called: Psychiatry Level of care: Progressive Cardiac  Admission status:  Status is: Inpatient.    Dispo: The patient is from: Home  Anticipated d/c is to: Inpatient psychiatry  Patient currently is not medically stable to d/c.              Difficult to place patient Yes      Objective: Vitals:   09/14/20 2300 09/15/20 0541 09/15/20 0822 09/15/20 1223  BP:  126/85 132/89 (!) 140/92  Pulse:  93 92 96  Resp: Temp:  98.8 F (37.1 C) 98.4 F (36.9 C) 99.3 F (37.4 C)  TempSrc:  Oral Oral Oral  SpO2:  99% 97% 95%  Weight:      Height:        Intake/Output Summary (Last 24 hours) at 09/15/2020 1415 Last data filed at 09/14/2020 2300 Gross per 24 hour  Intake 1097.09 ml  Output --  Net 1097.09 ml   Filed Weights   09/12/20 1344 09/14/20 0842  Weight: 122 kg 120.8 kg    Exam:  . General: 20 y.o. year-old male well-developed well-nourished in no acute distress.  He is alert and delusional. . Cardiovascular: Regular rate and rhythm no rubs or gallops. Marland Kitchen Respiratory:  Clear to auscultation with no wheezes or rales.   . Abdomen: Soft nontender normal bowel sounds present. . Musculoskeletal: No lower extremity edema bilaterally. . Skin: No ulcerative lesions noted. Marland Kitchen Psychiatry: Flat affect.  Data Reviewed: CBC: Recent Labs  Lab 09/13/20 1800 09/14/20 0509 09/15/20 0647  WBC 16.8* 10.6* 7.6  NEUTROABS 12.6*  --   --   HGB 17.2* 15.5 15.2  HCT 51.7 44.9 45.0  MCV 87.8 86.2 87.9  PLT 428* 311 266   Basic Metabolic Panel: Recent Labs  Lab 09/13/20 1800 09/14/20 0509 09/15/20 0647  NA 138 139 137  K 3.8 3.1* 4.0  CL 103 104 104  CO2 16* 24 26  GLUCOSE 150* 106* 91  BUN CREATININE 1.22 0.73 0.73  CALCIUM 9.8 9.0 9.2  MG 2.1  --   --    GFR: Estimated Creatinine Clearance: 205.7 mL/min (by C-G formula based on SCr of 0.73 mg/dL). Liver Function Tests: Recent Labs  Lab 09/13/20 1800  AST 59*  ALT 31  ALKPHOS 74  BILITOT 0.7  PROT 8.8*  ALBUMIN 5.5*   No results for input(s): LIPASE, AMYLASE in the last 168 hours. No results for input(s): AMMONIA in the last 168 hours. Coagulation Profile: No results for input(s): INR, PROTIME in the last 168 hours. Cardiac Enzymes: Recent Labs  Lab 09/13/20 1800 09/14/20 0509 09/15/20 0647  CKTOTAL 1,259* 873* 583*   BNP (last 3 results) No results for input(s): PROBNP in the last 8760 hours. HbA1C: No results for input(s): HGBA1C in the last 72 hours. CBG: No results for input(s): GLUCAP in the last 168 hours. Lipid Profile: No results for input(s): CHOL, HDL, LDLCALC, TRIG, CHOLHDL, LDLDIRECT in the last 72 hours. Thyroid Function Tests: No results for input(s): TSH, T4TOTAL, FREET4, T3FREE, THYROIDAB in the last 72 hours. Anemia Panel: No results for input(s): VITAMINB12, FOLATE, FERRITIN, TIBC, IRON, RETICCTPCT in the last 72 hours. Urine analysis:    Component Value Date/Time   COLORURINE YELLOW (A) 09/01/2020 1345   APPEARANCEUR TURBID (A) 09/01/2020 1345    APPEARANCEUR Clear 08/03/2012 0930   LABSPEC 1.029 09/01/2020 1345   LABSPEC 1.035 08/03/2012 0930   PHURINE 5.0 09/01/2020 1345   GLUCOSEU NEGATIVE 09/01/2020 1345   GLUCOSEU Negative 08/03/2012 0930   HGBUR NEGATIVE 09/01/2020 1345   BILIRUBINUR NEGATIVE 09/01/2020 1345   BILIRUBINUR Negative 08/03/2012 0930   KETONESUR 80 (A) 09/01/2020 1345   PROTEINUR 30 (A) 09/01/2020 1345   NITRITE NEGATIVE 09/01/2020 1345   LEUKOCYTESUR NEGATIVE 09/01/2020 1345   LEUKOCYTESUR Negative 08/03/2012 0930   Sepsis Labs: @LABRCNTIP (procalcitonin:4,lacticidven:4)  ) Recent Results (from the past 240 hour(s))  Resp Panel by RT-PCR (Flu A&B, Covid) Nasopharyngeal Swab     Status: None   Collection Time: 09/12/20  2:24 PM   Specimen: Nasopharyngeal Swab; Nasopharyngeal(NP) swabs in vial transport medium  Result Value Ref Range Status   SARS Coronavirus 2 by RT PCR NEGATIVE NEGATIVE Final    Comment: (NOTE) SARS-CoV-2 target nucleic acids are NOT DETECTED.  The SARS-CoV-2 RNA is generally detectable in upper respiratory specimens during the acute phase of infection. The lowest concentration of SARS-CoV-2 viral copies this assay can detect is 138 copies/mL. A negative result does not preclude SARS-Cov-2 infection and should not be used as the sole basis for treatment or other patient management decisions. A negative result may occur with  improper specimen collection/handling, submission of specimen other than nasopharyngeal swab, presence of viral mutation(s) within the areas targeted by this assay, and inadequate number of viral copies(<138 copies/mL). A negative result must be combined with clinical observations, patient history, and epidemiological information. The expected result is Negative.  Fact Sheet for Patients:  09/14/20  Fact Sheet for Healthcare Providers:  BloggerCourse.com  This test is no t yet approved or cleared by  the [employer] FDA and  has been authorized for detection and/or diagnosis of SARS-CoV-2 by FDA under an Emergency Use Authorization (EUA). This EUA will remain  in effect (meaning this test can be used) for the duration of the COVID-19 declaration under Section 564(b)(1) of the Act, 21 U.S.C.section 360bbb-3(b)(1), unless the authorization is terminated  or revoked sooner.       Influenza A by PCR NEGATIVE NEGATIVE Final   Influenza B by PCR NEGATIVE NEGATIVE Final    Comment: (NOTE) The Xpert Xpress SARS-CoV-2/FLU/RSV plus assay is intended as an aid in the diagnosis of influenza from Nasopharyngeal swab specimens and should not be used as a sole basis for treatment. Nasal washings and aspirates are unacceptable for Xpert Xpress SARS-CoV-2/FLU/RSV testing.  Fact Sheet for Patients: BloggerCourse.com  Fact Sheet for Healthcare Providers: SeriousBroker.it  This test is not yet approved or cleared by the [place] FDA and has been authorized for detection and/or diagnosis of SARS-CoV-2 by FDA under an Emergency Use Authorization (EUA). This EUA will remain in effect (meaning this test can be used) for the duration of the COVID-19 declaration under Section 564(b)(1) of the Act, 21 U.S.C. section 360bbb-3(b)(1), unless the authorization is terminated or revoked.  Performed at [facility], [address], [place], [place] [address]       Studies: No results found.  Scheduled Meds: . carbamazepine  200 mg Oral BID  . desvenlafaxine  100 mg Oral Daily  . enoxaparin (LOVENOX) injection  0.5 mg/kg Subcutaneous Q24H  . polyethylene glycol  17 g Oral Daily  . senna  2 tablet Oral BID  . sodium chloride flush  3 mL Intravenous Q12H    Continuous Infusions: . lactated ringers 100 mL/hr at 09/15/20 1300     LOS: 1 day     Darlin Drop, MD [facility] Pager [phone]  If 7PM-7AM, please  contact night-coverage [email or web address] Password [email or web address] 09/15/2020, 2:15 PM    PROGRESS NOTE  Connor [name] OQH:[id number] DOB: 08-16-2000 DOA: 09/12/2020 PCP: Abram Sander, MD  HPI/Recap of past 24 hours: Connor [name] is a 20 y.o. male with medical history significant for Autism spectrum disorder, delusional disorder, neurofibromatosis who was brought to the ED on 09/12/2020 with delusions, anxiety, and aggressive behavior.  He was reported to have expressed intention to cause physical harm to his mother as well as suicidal statements.  He was seen by psychiatry and placed under IVC with plan to admit to inpatient psychiatric ward pending bed availability.  He was placed on IM Ativan 2 mg every 6 hours as needed for anxiety, IM Haldol 10 mg every 6 hours as needed for agitation, and IM Benadryl every 6 hours as needed to be given with IM Haldol.  He received doses of these at 1735 on 3/22 and again earlier today at 1108.  He did also receive a dose of IM Geodon 20 mg at 0128.  Around 1700 on 09/13/20, nursing found him standing in the bathroom drooling.  Patient stated that he cannot breathe.  He was noted to be diaphoretic and pale.  He had torticolic twisting of his neck towards the left.  Vitals showed tachycardia with rates >160.  EKG was obtained and showed SVT with a rate 184 bpm.  Patient was given 2 mg IM Ativan and 50 mg IM Benadryl.  He was given 1 L LR fluid bolus.  Repeat EKG showed sinus tachycardia with rate down to 142.  He was reevaluated by psychiatry who feel this is a severe dystonic reaction with torticollis and less likely to be neuroleptic malignant syndrome.  Labs were obtained and notable for leukocytosis with WBC 16.8K and CK 1259.  Creatinine 1.22 compared to 0.72 09/05/2020.  Potassium 3.8, bicarb 16.  Given elevated CK and SVT, the hospitalist service was consulted to admit for further evaluation and management.  MRI brain without contrast done  on 09/13/2020 revealed normal brain imaging  At time of Dr. Eliane Decree evaluation, patient is resting supine in bed.  He is awake and alert.  He states he was exposed to radiation via CT scan 71 days ago.  He reports feeling like he has some muscle cramping.  He denies any nausea, vomiting, abdominal pain.  He is moving all extremities equally without rigidity.  Affect is flat.  09/14/20: Seen and examined at his bedside in the ED, he is alert and confused.  Assessment/Plan: Principal Problem:   Delusional disorder (HCC) Active Problems:   Neurofibromatosis, type I (von Recklinghausen's disease) (HCC)   Somatic symptom disorder   Acute dystonic reaction due to drugs  Acute dystonic reaction with elevated CK: Suspected severe acute dystonic reaction secondary to antipsychotic medication.  He did receive IM Haldol and Geodon within the last 24 hours.  CK >1200.  Psychiatry feel this is less likely to be neuroleptic malignant syndrome. -S/p 1 L LR bolus, will give additional bolus this evening -Check urinalysis, nitrite negative, budding yeast present.   -Monitor urine output -No further neuroleptic medications, Haldol has already been discontinued -Continue IM Benadryl 50 mg q2h prn per psychiatry -Rest of management per psychiatry.  Resolved AKI, likely prerenal in the setting of dehydration from poor oral intake At baseline creatinine 0.7 with GFR greater than 60 Currently back to his baseline Presented with creatinine 1.2  Avoid nephrotoxic agents Continue IV fluid hydration Monitor urine output  SVT: Transient SVT in setting of acute dystonic reaction.   Rate is improving but still in sinus tachycardia after receiving Ativan and 1 L LR. Continue IV fluid Continue to monitor on telemetry.  Leukocytosis, likely reactive Leukocytosis is resolving, no antibiotics.  Continue to monitor off antibiotics.  Delusional disorder/psychosis with agitation/aggressive behavior: Psychiatry  following, appreciate assistance.   Under IVC with plans for inpatient psychiatry admission once medically stable and bed available. -Continue IV Ativan 2 mg IM q6h as needed anxiety per psychiatry -Avoid further neuroleptics due to severe dystonic reaction Rest of management per psychiatry.  DVT prophylaxis: Lovenox subcu daily Code Status: Full code Family Communication:  Called the patient's mother on 09/14/2020, no answer will call again. Disposition Plan: From home, dispo likely to inpatient psychiatry when medically stable Consults called: Psychiatry Level of care: Progressive Cardiac  Admission status:  Status is: Observation  The patient remains OBS appropriate and will d/c before 2 midnights.  Dispo: The patient is from: Home  Anticipated d/c is to: Inpatient psychiatry  Patient currently is not medically stable to d/c.              Difficult to place patient Yes      Objective: Vitals:   09/14/20 2300 09/15/20 0541 09/15/20 0822 09/15/20 1223  BP:  126/85 132/89 (!) 140/92  Pulse:  93 92 96  Resp: 19 20 16 16   Temp:  98.8 F (37.1 C) 98.4 F (36.9 C) 99.3 F (37.4 C)  TempSrc:  Oral Oral Oral  SpO2:  99% 97% 95%  Weight:      Height:        Intake/Output Summary (Last 24 hours) at 09/15/2020 1415 Last data filed at 09/14/2020 2300 Gross per 24 hour  Intake 1097.09 ml  Output --  Net 1097.09 ml   Filed Weights   09/12/20 1344 09/14/20 0842  Weight: 122 kg 120.8 kg    Exam:  . General: 20 y.o. year-old male well developed well nourished in no acute distress.  Alert and confused. . Cardiovascular: Tachycardic with no rubs or gallops.  No thyromegaly or JVD noted.   21 Respiratory: Clear to auscultation with no wheezes or rales. Good inspiratory effort. . Abdomen: Soft nontender nondistended with normal bowel sounds x4 quadrants. . Musculoskeletal: No lower extremity edema. 2/4 pulses in all 4 extremities. . Skin:  No  ulcerative lesions noted or rashes, . Psychiatry: Flat affect.  Data Reviewed: CBC: Recent Labs  Lab 09/13/20 1800 09/14/20 0509 09/15/20 0647  WBC 16.8* 10.6* 7.6  NEUTROABS 12.6*  --   --   HGB 17.2* 15.5 15.2  HCT 51.7 44.9 45.0  MCV 87.8 86.2 87.9  PLT 428* 311 266   Basic Metabolic Panel: Recent Labs  Lab 09/13/20 1800 09/14/20 0509 09/15/20 0647  NA 138 139 137  K 3.8 3.1* 4.0  CL 103 104 104  CO2 16* 24 26  GLUCOSE 150* 106* 91  BUN 9 7 6   CREATININE 1.22 0.73 0.73  CALCIUM 9.8 9.0 9.2  MG 2.1  --   --    GFR: Estimated Creatinine Clearance: 205.7 mL/min (by C-G formula based on SCr of 0.73 mg/dL). Liver Function Tests: Recent Labs  Lab 09/13/20 1800  AST 59*  ALT 31  ALKPHOS 74  BILITOT 0.7  PROT 8.8*  ALBUMIN 5.5*   No results for input(s): LIPASE, AMYLASE in the last 168 hours. No results for input(s): AMMONIA in the last 168 hours. Coagulation Profile: No results for input(s): INR, PROTIME in the last 168 hours. Cardiac Enzymes: Recent Labs  Lab 09/13/20 1800 09/14/20 0509 09/15/20 0647  CKTOTAL 1,259* 873* 583*   BNP (last 3 results) No results for input(s): PROBNP in the last 8760 hours. HbA1C: No results for input(s): HGBA1C in the last 72 hours. CBG: No results for input(s): GLUCAP in the last 168 hours. Lipid Profile: No results for input(s): CHOL, HDL, LDLCALC, TRIG, CHOLHDL, LDLDIRECT in the last 72 hours. Thyroid Function Tests: No results for input(s): TSH, T4TOTAL, FREET4, T3FREE, THYROIDAB in the last 72 hours. Anemia Panel: No results for input(s): VITAMINB12, FOLATE, FERRITIN, TIBC, IRON, RETICCTPCT in the last 72 hours. Urine analysis:    Component Value Date/Time   COLORURINE YELLOW (A) 09/01/2020 1345   APPEARANCEUR TURBID (A) 09/01/2020 1345   APPEARANCEUR Clear 08/03/2012 0930   LABSPEC 1.029 09/01/2020 1345   LABSPEC 1.035 08/03/2012 0930   PHURINE 5.0 09/01/2020 1345   GLUCOSEU NEGATIVE 09/01/2020 1345    GLUCOSEU Negative 08/03/2012 0930   HGBUR NEGATIVE 09/01/2020 1345   BILIRUBINUR NEGATIVE 09/01/2020 1345   BILIRUBINUR Negative 08/03/2012 0930   KETONESUR 80 (A) 09/01/2020 1345   PROTEINUR 30 (A) 09/01/2020 1345   NITRITE NEGATIVE 09/01/2020 1345   LEUKOCYTESUR NEGATIVE 09/01/2020 1345   LEUKOCYTESUR Negative 08/03/2012 0930   Sepsis Labs: @LABRCNTIP (procalcitonin:4,lacticidven:4)  ) Recent Results (from the past 240 hour(s))  Resp Panel by RT-PCR (Flu A&B, Covid) Nasopharyngeal Swab     Status: None   Collection Time: 09/12/20  2:24 PM   Specimen: Nasopharyngeal Swab; Nasopharyngeal(NP) swabs in vial transport medium  Result Value Ref Range Status   SARS Coronavirus 2 by RT PCR NEGATIVE NEGATIVE Final    Comment: (NOTE) SARS-CoV-2 target nucleic acids are NOT DETECTED.  The SARS-CoV-2 RNA is generally detectable in upper respiratory specimens during the acute phase of infection. The lowest concentration of SARS-CoV-2 viral copies this assay can detect is 138 copies/mL. A negative result does not preclude SARS-Cov-2 infection and should not be used as the sole basis for treatment or other patient management decisions. A negative result may occur with  improper specimen collection/handling, submission of specimen other than nasopharyngeal swab, presence of viral mutation(s) within the areas targeted by this assay, and inadequate number of viral copies(<138 copies/mL). A negative result must be combined with clinical observations, patient history, and epidemiological information.  The expected result is Negative.  Fact Sheet for Patients:  BloggerCourse.com  Fact Sheet for Healthcare Providers:  [employer]ious[employer]ker.it  This test is no t yet approved or cleared by the [place] FDA and  has been authorized for detection and/or diagnosis of SARS-CoV-2 by FDA under an Emergency Use Authorization (EUA). This EUA will remain  in  effect (meaning this test can be used) for the duration of the COVID-19 declaration under Section 564(b)(1) of the Act, 21 U.S.C.section 360bbb-3(b)(1), unless the authorization is terminated  or revoked sooner.       Influenza A by PCR NEGATIVE NEGATIVE Final   Influenza B by PCR NEGATIVE NEGATIVE Final    Comment: (NOTE) The Xpert Xpress SARS-CoV-2/FLU/RSV plus assay is intended as an aid in the diagnosis of influenza from Nasopharyngeal swab specimens and should not be used as a sole basis for treatment. Nasal washings and aspirates are unacceptable for Xpert Xpress SARS-CoV-2/FLU/RSV testing.  Fact Sheet for Patients: BloggerCourse.com  Fact Sheet for Healthcare Providers: SeriousBroker.it  This test is not yet approved or cleared by the [place] FDA and has been authorized for detection and/or diagnosis of SARS-CoV-2 by FDA under an Emergency Use Authorization (EUA). This EUA will remain in effect (meaning this test can be used) for the duration of the COVID-19 declaration under Section 564(b)(1) of the Act, 21 U.S.C. section 360bbb-3(b)(1), unless the authorization is terminated or revoked.  Performed at [facility], [address], [place], [place] [address]       Studies: No results found.  Scheduled Meds: . carbamazepine  200 mg Oral BID  . desvenlafaxine  100 mg Oral Daily  . enoxaparin (LOVENOX) injection  0.5 mg/kg Subcutaneous Q24H  . polyethylene glycol  17 g Oral Daily  . senna  2 tablet Oral BID  . sodium chloride flush  3 mL Intravenous Q12H    Continuous Infusions: . lactated ringers 100 mL/hr at 09/15/20 1300     LOS: 1 day     Darlin Drop, MD [facility] Pager [phone]  If 7PM-7AM, please contact night-coverage [email or web address] Password [email or web address] 09/15/2020, 2:15 PM

## 2020-09-15 NOTE — Progress Notes (Signed)
Mobility Specialist - Progress Note   09/15/20 1600  Mobility  Activity Ambulated in hall  Mobility performed by Mobility specialist    Pt monitored ambulating in hallway at time of attempt. No AD. No LOB. Independent.    Filiberto Pinks Mobility Specialist 09/15/20, 4:25 PM

## 2020-09-16 DIAGNOSIS — F22 Delusional disorders: Secondary | ICD-10-CM | POA: Diagnosis not present

## 2020-09-16 LAB — CK: Total CK: 634 U/L — ABNORMAL HIGH (ref 49–397)

## 2020-09-16 NOTE — Progress Notes (Signed)
PROGRESS NOTE  Connor [name] HEN:[id number] DOB: October 01, 2000 DOA: 09/12/2020 PCP: Abram Sander, MD  HPI/Recap of past 24 hours: Connor [name] is a 20 y.o. male with medical history significant for Autism spectrum disorder, delusional disorder, neurofibromatosis who was brought to the ED on 09/12/2020 with delusions, anxiety, and aggressive behavior.  He was reported to have expressed intention to cause physical harm to his mother as well as suicidal statements.  He was seen by psychiatry and placed under IVC with plan to admit to inpatient psychiatric ward pending bed availability.  He was placed on IM Ativan 2 mg every 6 hours as needed for anxiety, IM Haldol 10 mg every 6 hours as needed for agitation, and IM Benadryl every 6 hours as needed to be given with IM Haldol.  He received doses of these at 1735 on 3/22 and again earlier today at 1108.  He did also receive a dose of IM Geodon 20 mg at 0128.  Around 1700 on 09/13/20, nursing found him standing in the bathroom drooling.  Patient stated that he cannot breathe.  He was noted to be diaphoretic and pale.  He had torticolic twisting of his neck towards the left.  Vitals showed tachycardia with rates >160.  EKG was obtained and showed SVT with a rate 184 bpm.  Patient was given 2 mg IM Ativan and 50 mg IM Benadryl.  He was given 1 L LR fluid bolus.  Repeat EKG showed sinus tachycardia with rate down to 142.  He was reevaluated by psychiatry who feel this is a severe dystonic reaction with torticollis and less likely to be neuroleptic malignant syndrome.  Labs were obtained and notable for leukocytosis with WBC 16.8K and CK 1259.  Creatinine 1.22 compared to 0.72 09/05/2020.  Potassium 3.8, bicarb 16.  Given elevated CK and SVT, the hospitalist service was consulted to admit for further evaluation and management.  MRI brain without contrast done on 09/13/2020 revealed normal brain imaging  At time of Dr. Eliane Decree  evaluation, patient is resting supine in bed.  He is awake and alert.  He states he was exposed to radiation via CT scan 71 days ago.  He reports feeling like he has some muscle cramping.  He denies any nausea, vomiting, abdominal pain.  He is moving all extremities equally without rigidity.  Affect is flat.  Seen by psychiatry.  Recommends inpatient psych placement.  Vital signs and labs reviewed.  The patient is medically cleared for discharge to inpatient psych.  TOC consulted to assist with placement.  Updated his mother at bedside.  All questions answered to the best of my ability.  09/16/20: Patient was seen and examined at his bedside.  He is alert and delusional.  Complains of radiation injuries all over his body.  Patient will benefit from inpatient psych admission.  Patient is medically cleared for discharge to inpatient psych.  Assessment/Plan: Principal Problem:   Delusional disorder (HCC) Active Problems:   Neurofibromatosis, type I (von Recklinghausen's disease) (HCC)   Somatic symptom disorder   Acute dystonic reaction due to drugs  Acute dystonic reaction with elevated CK: Suspected severe acute dystonic reaction on admission secondary to antipsychotic medication.  He did receive IM Haldol and Geodon within the last 24 hours.  Initial CK >1200.  Psychiatry feel this is less likely to be neuroleptic malignant syndrome. Received IV fluid hydration. Urine analysis nitrite negative, budding yeast present.   Management per psychiatry.  Resolved AKI, likely prerenal  in the setting of dehydration from poor oral intake At baseline creatinine 0.7 with GFR greater than 60 Currently back to his baseline Presented with creatinine 1.2  Avoid nephrotoxic agents Continue IV fluid hydration Monitor urine output  Resolved post IV hydration.  SVT: Transient SVT in setting of acute dystonic reaction.   Has received IV fluid hydration, continue. Continue to monitor on  telemetry.  Resolved leukocytosis, likely reactive Not on antibiotics.  Delusional disorder/psychosis with agitation/aggressive behavior: Psychiatry following, appreciate assistance.   Under IVC with plans for inpatient psychiatry admission once medically stable and bed available. -Continue IV Ativan 2 mg IM q6h as needed anxiety per psychiatry -Avoid further neuroleptics due to severe dystonic reaction Rest of management per psychiatry. Seen by psychiatry.  Recommends inpatient psych placement.  Vital signs and labs reviewed.  The patient is medically cleared for discharge to inpatient psych.  TOC consulted to assist with placement.    Elevated CPK likely from acute dystonia Down trended Repeat CPK.  DVT prophylaxis: Lovenox subcu daily Code Status: Full code Family Communication:  Updated the mother at bedside on 09/15/2020.    Disposition Plan: From home, dispo to inpatient psychiatry when a bed is available. Consults called: Psychiatry Level of care: Progressive Cardiac, downgrade to MedSurg.  Admission status:  Status is: Inpatient.    Dispo: The patient is from: Home  Anticipated d/c is to: Inpatient psychiatry patient is currently stable for discharge to inpatient psych.              Difficult to place patient Yes      Objective: Vitals:   09/15/20 1700 09/15/20 1918 09/16/20 0600 09/16/20 0758  BP: (!) 138/92 135/86 136/86 (!) 134/91  Pulse: 98 78 78 86  Resp: Temp: 98.6 F (37 C) 98.8 F (37.1 C) 98.9 F (37.2 C) 99 F (37.2 C)  TempSrc:  Oral Oral Oral  SpO2: 98% 98% 99% 97%  Weight:      Height:        Intake/Output Summary (Last 24 hours) at 09/16/2020 1251 Last data filed at 09/16/2020 0940 Gross per 24 hour  Intake 0 ml  Output -  Net 0 ml   Filed Weights   09/12/20 1344 09/14/20 0842  Weight: 122 kg 120.8 kg    Exam:  . General: 20 y.o. year-old male well-developed well-nourished in no acute distress.   He is alert and delusional.   . Cardiovascular: Regular rate and rhythm no rubs or gallops.   Marland Kitchen Respiratory: Clear to auscultation no wheezes or rales.   . Abdomen: Soft nontender normal bowel sounds present.   . Musculoskeletal: No lower extremity edema bilaterally.   . Skin: No ulcerative lesions noted. Marland Kitchen Psychiatry: Flat Affect.  Data Reviewed: CBC: Recent Labs  Lab 09/13/20 1800 09/14/20 0509 09/15/20 0647  WBC 16.8* 10.6* 7.6  NEUTROABS 12.6*  --   --   HGB 17.2* 15.5 15.2  HCT 51.7 44.9 45.0  MCV 87.8 86.2 87.9  PLT 428* 311 266   Basic Metabolic Panel: Recent Labs  Lab 09/13/20 1800 09/14/20 0509 09/15/20 0647  NA 138 139 137  K 3.8 3.1* 4.0  CL 103 104 104  CO2 16* 24 26  GLUCOSE 150* 106* 91  BUN CREATININE 1.22 0.73 0.73  CALCIUM 9.8 9.0 9.2  MG 2.1  --   --    GFR: Estimated Creatinine Clearance: 205.7 mL/min (by C-G formula based on SCr of  0.73 mg/dL). Liver Function Tests: Recent Labs  Lab 09/13/20 1800  AST 59*  ALT 31  ALKPHOS 74  BILITOT 0.7  PROT 8.8*  ALBUMIN 5.5*   No results for input(s): LIPASE, AMYLASE in the last 168 hours. No results for input(s): AMMONIA in the last 168 hours. Coagulation Profile: No results for input(s): INR, PROTIME in the last 168 hours. Cardiac Enzymes: Recent Labs  Lab 09/13/20 1800 09/14/20 0509 09/15/20 0647  CKTOTAL 1,259* 873* 583*   BNP (last 3 results) No results for input(s): PROBNP in the last 8760 hours. HbA1C: No results for input(s): HGBA1C in the last 72 hours. CBG: No results for input(s): GLUCAP in the last 168 hours. Lipid Profile: No results for input(s): CHOL, HDL, LDLCALC, TRIG, CHOLHDL, LDLDIRECT in the last 72 hours. Thyroid Function Tests: No results for input(s): TSH, T4TOTAL, FREET4, T3FREE, THYROIDAB in the last 72 hours. Anemia Panel: No results for input(s): VITAMINB12, FOLATE, FERRITIN, TIBC, IRON, RETICCTPCT in the last 72 hours. Urine analysis:    Component  Value Date/Time   COLORURINE YELLOW (A) 09/01/2020 1345   APPEARANCEUR TURBID (A) 09/01/2020 1345   APPEARANCEUR Clear 08/03/2012 0930   LABSPEC 1.029 09/01/2020 1345   LABSPEC 1.035 08/03/2012 0930   PHURINE 5.0 09/01/2020 1345   GLUCOSEU NEGATIVE 09/01/2020 1345   GLUCOSEU Negative 08/03/2012 0930   HGBUR NEGATIVE 09/01/2020 1345   BILIRUBINUR NEGATIVE 09/01/2020 1345   BILIRUBINUR Negative 08/03/2012 0930   KETONESUR 80 (A) 09/01/2020 1345   PROTEINUR 30 (A) 09/01/2020 1345   NITRITE NEGATIVE 09/01/2020 1345   LEUKOCYTESUR NEGATIVE 09/01/2020 1345   LEUKOCYTESUR Negative 08/03/2012 0930   Sepsis Labs: (procalcitonin:4,lacticidven:4)  ) Recent Results (from the past 240 hour(s))  Resp Panel by RT-PCR (Flu A&B, Covid) Nasopharyngeal Swab     Status: None   Collection Time: 09/12/20  2:24 PM   Specimen: Nasopharyngeal Swab; Nasopharyngeal(NP) swabs in vial transport medium  Result Value Ref Range Status   SARS Coronavirus 2 by RT PCR NEGATIVE NEGATIVE Final    Comment: (NOTE) SARS-CoV-2 target nucleic acids are NOT DETECTED.  The SARS-CoV-2 RNA is generally detectable in upper respiratory specimens during the acute phase of infection. The lowest concentration of SARS-CoV-2 viral copies this assay can detect is 138 copies/mL. A negative result does not preclude SARS-Cov-2 infection and should not be used as the sole basis for treatment or other patient management decisions. A negative result may occur with  improper specimen collection/handling, submission of specimen other than nasopharyngeal swab, presence of viral mutation(s) within the areas targeted by this assay, and inadequate number of viral copies(<138 copies/mL). A negative result must be combined with clinical observations, patient history, and epidemiological information. The expected result is Negative.  Fact Sheet for Patients:  BloggerCourse.com  Fact Sheet for Healthcare  Providers:  SeriousBroker.it  This test is no t yet approved or cleared by the [place] FDA and  has been authorized for detection and/or diagnosis of SARS-CoV-2 by FDA under an Emergency Use Authorization (EUA). This EUA will remain  in effect (meaning this test can be used) for the duration of the COVID-19 declaration under Section 564(b)(1) of the Act, 21 U.S.C.section 360bbb-3(b)(1), unless the authorization is terminated  or revoked sooner.       Influenza A by PCR NEGATIVE NEGATIVE Final   Influenza B by PCR NEGATIVE NEGATIVE Final    Comment: (NOTE) The Xpert Xpress SARS-CoV-2/FLU/RSV plus assay is intended as an aid in the diagnosis of influenza from Nasopharyngeal  swab specimens and should not be used as a sole basis for treatment. Nasal washings and aspirates are unacceptable for Xpert Xpress SARS-CoV-2/FLU/RSV testing.  Fact Sheet for Patients: BloggerCourse.com  Fact Sheet for Healthcare Providers: SeriousBroker.it  This test is not yet approved or cleared by the [place] FDA and has been authorized for detection and/or diagnosis of SARS-CoV-2 by FDA under an Emergency Use Authorization (EUA). This EUA will remain in effect (meaning this test can be used) for the duration of the COVID-19 declaration under Section 564(b)(1) of the Act, 21 U.S.C. section 360bbb-3(b)(1), unless the authorization is terminated or revoked.  Performed at [facility], [address] [place], [place] [address]       Studies: No results found.  Scheduled Meds: . carbamazepine  200 mg Oral BID  . desvenlafaxine  100 mg Oral Daily  . enoxaparin (LOVENOX) injection  0.5 mg/kg Subcutaneous Q24H  . feeding supplement  237 mL Oral TID BM  . multivitamin with minerals  1 tablet Oral Daily  . pantoprazole  40 mg Oral Daily  . polyethylene glycol  17 g Oral Daily  . senna  2 tablet Oral BID   . sodium chloride flush  3 mL Intravenous Q12H    Continuous Infusions:    LOS: 2 days     Darlin Drop, MD [facility] Pager [phone]  If 7PM-7AM, please contact night-coverage [email or web address] Password [email or web address] 09/16/2020, 12:51 PM    PROGRESS NOTE  Connor [name] PYP:[id number] DOB: [birth year] DOA: 09/12/2020 PCP: Abram Sander, MD  HPI/Recap of past 24 hours: Connor [name] is a 20 y.o. male with medical history significant for Autism spectrum disorder, delusional disorder, neurofibromatosis who was brought to the ED on 09/12/2020 with delusions, anxiety, and aggressive behavior.  He was reported to have expressed intention to cause physical harm to his mother as well as suicidal statements.  He was seen by psychiatry and placed under IVC with plan to admit to inpatient psychiatric ward pending bed availability.  He was placed on IM Ativan 2 mg every 6 hours as needed for anxiety, IM Haldol 10 mg every 6 hours as needed for agitation, and IM Benadryl every 6 hours as needed to be given with IM Haldol.  He received doses of these at 1735 on 3/22 and again earlier today at 1108.  He did also receive a dose of IM Geodon 20 mg at 0128.  Around 1700 on 09/13/20, nursing found him standing in the bathroom drooling.  Patient stated that he cannot breathe.  He was noted to be diaphoretic and pale.  He had torticolic twisting of his neck towards the left.  Vitals showed tachycardia with rates >160.  EKG was obtained and showed SVT with a rate 184 bpm.  Patient was given 2 mg IM Ativan and 50 mg IM Benadryl.  He was given 1 L LR fluid bolus.  Repeat EKG showed sinus tachycardia with rate down to 142.  He was reevaluated by psychiatry who feel this is a severe dystonic reaction with torticollis and less likely to be neuroleptic malignant syndrome.  Labs were obtained and notable for leukocytosis with WBC 16.8K and CK 1259.  Creatinine 1.22 compared to 0.72  09/05/2020.  Potassium 3.8, bicarb 16.  Given elevated CK and SVT, the hospitalist service was consulted to admit for further evaluation and management.  MRI brain without contrast done on 09/13/2020 revealed normal brain imaging  At time of Dr. Eliane Decree evaluation, patient  is resting supine in bed.  He is awake and alert.  He states he was exposed to radiation via CT scan 71 days ago.  He reports feeling like he has some muscle cramping.  He denies any nausea, vomiting, abdominal pain.  He is moving all extremities equally without rigidity.  Affect is flat.  09/14/20: Seen and examined at his bedside in the ED, he is alert and confused.  Assessment/Plan: Principal Problem:   Delusional disorder (HCC) Active Problems:   Neurofibromatosis, type I (von Recklinghausen's disease) (HCC)   Somatic symptom disorder   Acute dystonic reaction due to drugs  Acute dystonic reaction with elevated CK: Suspected severe acute dystonic reaction secondary to antipsychotic medication.  He did receive IM Haldol and Geodon within the last 24 hours.  CK >1200.  Psychiatry feel this is less likely to be neuroleptic malignant syndrome. -S/p 1 L LR bolus, will give additional bolus this evening -Check urinalysis, nitrite negative, budding yeast present.   -Monitor urine output -No further neuroleptic medications, Haldol has already been discontinued -Continue IM Benadryl 50 mg q2h prn per psychiatry -Rest of management per psychiatry.  Resolved AKI, likely prerenal in the setting of dehydration from poor oral intake At baseline creatinine 0.7 with GFR greater than 60 Currently back to his baseline Presented with creatinine 1.2  Avoid nephrotoxic agents Continue IV fluid hydration Monitor urine output  SVT: Transient SVT in setting of acute dystonic reaction.   Rate is improving but still in sinus tachycardia after receiving Ativan and 1 L LR. Continue IV fluid Continue to monitor on  telemetry.  Leukocytosis, likely reactive Leukocytosis is resolving, no antibiotics.  Continue to monitor off antibiotics.  Delusional disorder/psychosis with agitation/aggressive behavior: Psychiatry following, appreciate assistance.   Under IVC with plans for inpatient psychiatry admission once medically stable and bed available. -Continue IV Ativan 2 mg IM q6h as needed anxiety per psychiatry -Avoid further neuroleptics due to severe dystonic reaction Rest of management per psychiatry.  DVT prophylaxis: Lovenox subcu daily Code Status: Full code Family Communication:  Called the patient's mother on 09/14/2020, no answer will call again. Disposition Plan: From home, dispo likely to inpatient psychiatry when medically stable Consults called: Psychiatry Level of care: Progressive Cardiac  Admission status:  Status is: Observation  The patient remains OBS appropriate and will d/c before 2 midnights.  Dispo: The patient is from: Home  Anticipated d/c is to: Inpatient psychiatry  Patient currently is not medically stable to d/c.              Difficult to place patient Yes      Objective: Vitals:   09/15/20 1700 09/15/20 1918 09/16/20 0600 09/16/20 0758  BP: (!) 138/92 135/86 136/86 (!) 134/91  Pulse: 98 78 78 86  Resp: 18 19 19 18   Temp: 98.6 F (37 C) 98.8 F (37.1 C) 98.9 F (37.2 C) 99 F (37.2 C)  TempSrc:  Oral Oral Oral  SpO2: 98% 98% 99% 97%  Weight:      Height:        Intake/Output Summary (Last 24 hours) at 09/16/2020 1251 Last data filed at 09/16/2020 0940 Gross per 24 hour  Intake 0 ml  Output -  Net 0 ml   Filed Weights   09/12/20 1344 09/14/20 0842  Weight: 122 kg 120.8 kg    Exam:  . General: 20 y.o. year-old male well developed well nourished in no acute distress.  Alert and confused. . Cardiovascular: Tachycardic with no rubs  or gallops.  No thyromegaly or JVD noted.   Marland Kitchen. Respiratory: Clear to auscultation with  no wheezes or rales. Good inspiratory effort. . Abdomen: Soft nontender nondistended with normal bowel sounds x4 quadrants. . Musculoskeletal: No lower extremity edema. 2/4 pulses in all 4 extremities. . Skin: No ulcerative lesions noted or rashes, . Psychiatry: Flat affect.  Data Reviewed: CBC: Recent Labs  Lab 09/13/20 1800 09/14/20 0509 09/15/20 0647  WBC 16.8* 10.6* 7.6  NEUTROABS 12.6*  --   --   HGB 17.2* 15.5 15.2  HCT 51.7 44.9 45.0  MCV 87.8 86.2 87.9  PLT 428* 311 266   Basic Metabolic Panel: Recent Labs  Lab 09/13/20 1800 09/14/20 0509 09/15/20 0647  NA 138 139 137  K 3.8 3.1* 4.0  CL 103 104 104  CO2 16* 24 26  GLUCOSE 150* 106* 91  BUN 9 7 6   CREATININE 1.22 0.73 0.73  CALCIUM 9.8 9.0 9.2  MG 2.1  --   --    GFR: Estimated Creatinine Clearance: 205.7 mL/min (by C-G formula based on SCr of 0.73 mg/dL). Liver Function Tests: Recent Labs  Lab 09/13/20 1800  AST 59*  ALT 31  ALKPHOS 74  BILITOT 0.7  PROT 8.8*  ALBUMIN 5.5*   No results for input(s): LIPASE, AMYLASE in the last 168 hours. No results for input(s): AMMONIA in the last 168 hours. Coagulation Profile: No results for input(s): INR, PROTIME in the last 168 hours. Cardiac Enzymes: Recent Labs  Lab 09/13/20 1800 09/14/20 0509 09/15/20 0647  CKTOTAL 1,259* 873* 583*   BNP (last 3 results) No results for input(s): PROBNP in the last 8760 hours. HbA1C: No results for input(s): HGBA1C in the last 72 hours. CBG: No results for input(s): GLUCAP in the last 168 hours. Lipid Profile: No results for input(s): CHOL, HDL, LDLCALC, TRIG, CHOLHDL, LDLDIRECT in the last 72 hours. Thyroid Function Tests: No results for input(s): TSH, T4TOTAL, FREET4, T3FREE, THYROIDAB in the last 72 hours. Anemia Panel: No results for input(s): VITAMINB12, FOLATE, FERRITIN, TIBC, IRON, RETICCTPCT in the last 72 hours. Urine analysis:    Component Value Date/Time   COLORURINE YELLOW (A) 09/01/2020 1345    APPEARANCEUR TURBID (A) 09/01/2020 1345   APPEARANCEUR Clear 08/03/2012 0930   LABSPEC 1.029 09/01/2020 1345   LABSPEC 1.035 08/03/2012 0930   PHURINE 5.0 09/01/2020 1345   GLUCOSEU NEGATIVE 09/01/2020 1345   GLUCOSEU Negative 08/03/2012 0930   HGBUR NEGATIVE 09/01/2020 1345   BILIRUBINUR NEGATIVE 09/01/2020 1345   BILIRUBINUR Negative 08/03/2012 0930   KETONESUR 80 (A) 09/01/2020 1345   PROTEINUR 30 (A) 09/01/2020 1345   NITRITE NEGATIVE 09/01/2020 1345   LEUKOCYTESUR NEGATIVE 09/01/2020 1345   LEUKOCYTESUR Negative 08/03/2012 0930   Sepsis Labs: @LABRCNTIP (procalcitonin:4,lacticidven:4)  ) Recent Results (from the past 240 hour(s))  Resp Panel by RT-PCR (Flu A&B, Covid) Nasopharyngeal Swab     Status: None   Collection Time: 09/12/20  2:24 PM   Specimen: Nasopharyngeal Swab; Nasopharyngeal(NP) swabs in vial transport medium  Result Value Ref Range Status   SARS Coronavirus 2 by RT PCR NEGATIVE NEGATIVE Final    Comment: (NOTE) SARS-CoV-2 target nucleic acids are NOT DETECTED.  The SARS-CoV-2 RNA is generally detectable in upper respiratory specimens during the acute phase of infection. The lowest concentration of SARS-CoV-2 viral copies this assay can detect is 138 copies/mL. A negative result does not preclude SARS-Cov-2 infection and should not be used as the sole basis for treatment or other patient management decisions. A  negative result may occur with  improper specimen collection/handling, submission of specimen other than nasopharyngeal swab, presence of viral mutation(s) within the areas targeted by this assay, and inadequate number of viral copies(<138 copies/mL). A negative result must be combined with clinical observations, patient history, and epidemiological information. The expected result is Negative.  Fact Sheet for Patients:  BloggerCourse.com  Fact Sheet for Healthcare Providers:   SeriousBroker.it  This test is no t yet approved or cleared by the [place] FDA and  has been authorized for detection and/or diagnosis of SARS-CoV-2 by FDA under an Emergency Use Authorization (EUA). This EUA will remain  in effect (meaning this test can be used) for the duration of the COVID-19 declaration under Section 564(b)(1) of the Act, 21 U.S.C.section 360bbb-3(b)(1), unless the authorization is terminated  or revoked sooner.       Influenza A by PCR NEGATIVE NEGATIVE Final   Influenza B by PCR NEGATIVE NEGATIVE Final    Comment: (NOTE) The Xpert Xpress SARS-CoV-2/FLU/RSV plus assay is intended as an aid in the diagnosis of influenza from Nasopharyngeal swab specimens and should not be used as a sole basis for treatment. Nasal washings and aspirates are unacceptable for Xpert Xpress SARS-CoV-2/FLU/RSV testing.  Fact Sheet for Patients: BloggerCourse.com  Fact Sheet for Healthcare Providers: SeriousBroker.it  This test is not yet approved or cleared by the [place] FDA and has been authorized for detection and/or diagnosis of SARS-CoV-2 by FDA under an Emergency Use Authorization (EUA). This EUA will remain in effect (meaning this test can be used) for the duration of the COVID-19 declaration under Section 564(b)(1) of the Act, 21 U.S.C. section 360bbb-3(b)(1), unless the authorization is terminated or revoked.  Performed at [facility], [address], [place], [place] [address]       Studies: No results found.  Scheduled Meds: . carbamazepine  200 mg Oral BID  . desvenlafaxine  100 mg Oral Daily  . enoxaparin (LOVENOX) injection  0.5 mg/kg Subcutaneous Q24H  . feeding supplement  237 mL Oral TID BM  . multivitamin with minerals  1 tablet Oral Daily  . pantoprazole  40 mg Oral Daily  . polyethylene glycol  17 g Oral Daily  . senna  2 tablet Oral BID  . sodium  chloride flush  3 mL Intravenous Q12H    Continuous Infusions:    LOS: 2 days     Darlin Drop, MD [facility] Pager [phone]  If 7PM-7AM, please contact night-coverage [email or web address] Password [facility] 09/16/2020, 12:51 PM

## 2020-09-16 NOTE — Consult Note (Signed)
[facility] Face-to-Face Psychiatry Consult   Reason for Consult: Consult to follow-up with this 20 year old man who has what appears to be a delusional or obsessive-compulsive illness.  He was admitted to the medical service when he had a dystonic reaction Referring Physician: Margo Aye Patient Identification: Connor [name] MRN:  [id number] Principal Diagnosis: Delusional disorder [facility]) Diagnosis:  Principal Problem:   Delusional disorder (HCC) Active Problems:   Neurofibromatosis, type I (von Recklinghausen's disease) (HCC)   Somatic symptom disorder   Acute dystonic reaction due to drugs   Total Time spent with patient: 30 minutes  Subjective:   Connor [name] is a 20 y.o. male patient admitted with "I am still burning".  HPI: Patient seen chart reviewed.  Spoke with nursing.  Patient continues to state that he feels as though the skin on his body were all burning and when asked if it is any different than it had been in the past he will deny it.  Nevertheless he is clearly in much less distress now than he has been many times in the past.  He is not screaming and shouting.  He is able to engage in a conversation in a normal tone of voice.  I pointed out to him that what he is experiencing is a feeling but that he should not confuse it with stating that his skin is actually burning or with his assumption that it was caused by radiation.  I informed him that radiation burns of the skin like any burns are obvious to direct observation and that for his skin to be burned the way he believes it would be obviously red peeling and damaged whereas there is no visible damage at all to his skin.  He seemed to be able to at least think about this although he continues to believe there is something medically wrong with him.  Nursing reports he has been much less agitated this morning than he was yesterday.  Currently compliant with medication.  Past Psychiatric History: Past history of  autistic spectrum disorder treatment for anxiety and depression  Risk to Self:   Risk to Others:   Prior Inpatient Therapy:   Prior Outpatient Therapy:    Past Medical History:  Past Medical History:  Diagnosis Date  . ADHD (attention deficit hyperactivity disorder)   . Autism   . Depressed   . Neurofibromatosis (HCC)    Type 1  . Obesity   . Pertussis    as a infant    Past Surgical History:  Procedure Laterality Date  . MRI    . RADIOLOGY WITH ANESTHESIA N/A 08/13/2012   Procedure: RADIOLOGY WITH ANESTHESIA;  Surgeon: Medication Radiologist, MD;  Location: [facility] OR;  Service: Radiology;  Laterality: N/A;  MRI    Family History:  Family History  Problem Relation Age of Onset  . Anxiety disorder Mother   . Depression Mother   . OCD Mother   . Hypertension Mother   . Cancer Maternal Aunt   . Cancer Maternal Grandmother   . Hypertension Father   . Schizophrenia Maternal Uncle    Family Psychiatric  History: See previous.  Anxiety in the mother. Social History:  Social History   Substance and Sexual Activity  Alcohol Use No  . Alcohol/week: 0.0 standard drinks     Social History   Substance and Sexual Activity  Drug Use No    Social History   Socioeconomic History  . Marital status: Single    Spouse name: Not on  file  . Number of children: Not on file  . Years of education: Not on file  . Highest education level: Not on file  Occupational History  . Not on file  Tobacco Use  . Smoking status: Former Smoker    Packs/day: 1.00    Years: 1.00    Pack years: 1.00    Types: Cigarettes    Quit date: 12/31/2014    Years since quitting: 5.7  . Smokeless tobacco: Never Used  Substance and Sexual Activity  . Alcohol use: No    Alcohol/week: 0.0 standard drinks  . Drug use: No  . Sexual activity: Not Currently    Birth control/protection: None  Other Topics Concern  . Not on file  Social History Narrative   [name] is a high school drop out.   He lives with  his mom only. He has one sister.   He enjoys eating, sleeping, and watching tv.   Social Determinants of Health   Financial Resource Strain: Not on file  Food Insecurity: Not on file  Transportation Needs: Not on file  Physical Activity: Not on file  Stress: Not on file  Social Connections: Not on file   Additional Social History:    Allergies:   Allergies  Allergen Reactions  . Haldol [Haloperidol] Other (See Comments)    Dystonia      Labs:  Results for orders placed or performed during the hospital encounter of 09/12/20 (from the past 48 hour(s))  CBC     Status: None   Collection Time: 09/15/20  6:47 AM  Result Value Ref Range   WBC 7.6 4.0 - 10.5 K/uL   RBC 5.12 4.22 - 5.81 MIL/uL   Hemoglobin 15.2 13.0 - 17.0 g/dL   HCT 48.1 85.6 - 31.4 %   MCV 87.9 80.0 - 100.0 fL   MCH 29.7 26.0 - 34.0 pg   MCHC 33.8 30.0 - 36.0 g/dL   RDW 97.0 26.3 - 78.5 %   Platelets 266 150 - 400 K/uL   nRBC 0.0 0.0 - 0.2 %    Comment: Performed at [facility], [address], [place], [place] [address]  Basic metabolic panel     Status: None   Collection Time: 09/15/20  6:47 AM  Result Value Ref Range   Sodium 137 135 - 145 mmol/L   Potassium 4.0 3.5 - 5.1 mmol/L   Chloride 104 98 - 111 mmol/L   CO2 26 22 - 32 mmol/L   Glucose, Bld 91 70 - 99 mg/dL    Comment: Glucose reference range applies only to samples taken after fasting for at least 8 hours.   BUN 6 6 - 20 mg/dL   Creatinine, Ser 7.74 0.61 - 1.24 mg/dL   Calcium 9.2 8.9 - 12.8 mg/dL   GFR, Estimated >78 >67 mL/min    Comment: (NOTE) Calculated using the CKD-EPI Creatinine Equation (2021)    Anion gap 7 5 - 15    Comment: Performed at [facility], [address] [place], [place] [address]  [place]     Status: Abnormal   Collection Time: 09/15/20  6:47 AM  Result Value Ref Range   Total CK 583 (H) 49 - 397 U/L    Comment: Performed at [facility], [address], [place], [place] [address]     Current Facility-Administered Medications  Medication Dose Route Frequency Provider Last Rate Last Admin  . acetaminophen (TYLENOL) tablet 650 mg  650 mg Oral Q6H PRN Allena Katz,  Floreen ComberVishal R, MD       Or  . acetaminophen (TYLENOL) suppository 650 mg  650 mg Rectal Q6H PRN Charlsie QuestPatel, Vishal R, MD      . alum & mag hydroxide-simeth (MAALOX/MYLANTA) 200-200-20 MG/5ML suspension 30 mL  30 mL Oral Q6H PRN Heron Pitcock T, MD      . carbamazepine (TEGRETOL) tablet 200 mg  200 mg Oral BID Lisbeth Puller, Jackquline DenmarkJohn T, MD   200 mg at 09/16/20 0825  . desvenlafaxine (PRISTIQ) 24 hr tablet 100 mg  100 mg Oral Daily Burns Timson, Jackquline DenmarkJohn T, MD   100 mg at 09/16/20 0825  . diphenhydrAMINE (BENADRYL) injection 50 mg  50 mg Intramuscular Q2H PRN Bobak Oguinn, Jackquline DenmarkJohn T, MD   50 mg at 09/14/20 1949  . enoxaparin (LOVENOX) injection 60 mg  0.5 mg/kg Subcutaneous Q24H Ronnald Rampatel, Kishan S, RPH   60 mg at 09/14/20 2119  . feeding supplement (ENSURE ENLIVE / ENSURE PLUS) liquid 237 mL  237 mL Oral TID BM Hall, Carole N, DO   237 mL at 09/16/20 0829  . ibuprofen (ADVIL) tablet 600 mg  600 mg Oral Q6H PRN Carol Theys, Jackquline DenmarkJohn T, MD   600 mg at 09/15/20 2112  . LORazepam (ATIVAN) injection 2 mg  2 mg Intravenous Q4H PRN Sivan Cuello, Jackquline DenmarkJohn T, MD   2 mg at 09/15/20 1913  . metoprolol tartrate (LOPRESSOR) injection 2.5 mg  2.5 mg Intravenous Q4H PRN Dow AdolphHall, Carole N, DO   2.5 mg at 09/14/20 1031  . multivitamin with minerals tablet 1 tablet  1 tablet Oral Daily Dow AdolphHall, Carole N, DO   1 tablet at 09/16/20 0825  . pantoprazole (PROTONIX) EC tablet 40 mg  40 mg Oral Daily Jeraldean Wechter, Jackquline DenmarkJohn T, MD   40 mg at 09/16/20 0825  . polyethylene glycol (MIRALAX / GLYCOLAX) packet 17 g  17 g Oral Daily Dow AdolphHall, Carole N, DO   17 g at 09/16/20 [id number]  . senna (SENOKOT) tablet 17.2 mg  2 tablet Oral BID Dow AdolphHall, Carole N, DO   17.2 mg at 09/16/20 0825  . sodium chloride flush (NS) 0.9 % injection 3 mL  3 mL Intravenous Q12H Darreld McleanPatel, Vishal R, MD   3 mL at 09/16/20 0830  . sucralfate (CARAFATE) 1 GM/10ML  suspension 1 g  1 g Oral QID PRN Dow AdolphHall, Carole N, DO   1 g at 09/16/20 [id number]    Musculoskeletal: Strength & Muscle Tone: within normal limits Gait & Station: normal Patient leans: N/A            Psychiatric Specialty Exam:  Presentation  General Appearance: No data recorded Eye Contact:No data recorded Speech:No data recorded Speech Volume:No data recorded Handedness:No data recorded  Mood and Affect  Mood:No data recorded Affect:No data recorded  Thought Process  Thought Processes:No data recorded Descriptions of Associations:No data recorded Orientation:No data recorded Thought Content:No data recorded History of Schizophrenia/Schizoaffective disorder:No  Duration of Psychotic Symptoms:Less than six months  Hallucinations:No data recorded Ideas of Reference:No data recorded Suicidal Thoughts:No data recorded Homicidal Thoughts:No data recorded  Sensorium  Memory:No data recorded Judgment:No data recorded Insight:No data recorded  Executive Functions  Concentration:No data recorded Attention Span:No data recorded Recall:No data recorded Fund of Knowledge:No data recorded Language:No data recorded  Psychomotor Activity  Psychomotor Activity:No data recorded  Assets  Assets:No data recorded  Sleep  Sleep:No data recorded  Physical Exam: Physical Exam Vitals and nursing note reviewed.  Constitutional:      Appearance: Normal appearance.  HENT:     Head:  Normocephalic and atraumatic.     Mouth/Throat:     Pharynx: Oropharynx is clear.  Eyes:     Pupils: Pupils are equal, round, and reactive to light.  Cardiovascular:     Rate and Rhythm: Normal rate and regular rhythm.  Pulmonary:     Effort: Pulmonary effort is normal.     Breath sounds: Normal breath sounds.  Abdominal:     General: Abdomen is flat.     Palpations: Abdomen is soft.  Musculoskeletal:        General: Normal range of motion.  Skin:    General: Skin is warm and dry.   Neurological:     General: No focal deficit present.     Mental Status: He is alert. Mental status is at baseline.  Psychiatric:        Attention and Perception: He is inattentive.        Mood and Affect: Mood is anxious.        Speech: Speech normal.        Behavior: Behavior is slowed.        Thought Content: Thought content is delusional. Thought content does not include homicidal or suicidal ideation.        Cognition and Memory: Cognition is impaired.        Judgment: Judgment is inappropriate.    Review of Systems  Constitutional: Negative.        Patient continues to complain that he feels as though all the skin on his body was "burning".  Does not differentiate any particular part of his body.  No other single specific complaint at this time  HENT: Negative.   Eyes: Negative.   Respiratory: Negative.   Cardiovascular: Negative.   Gastrointestinal: Negative.   Musculoskeletal: Negative.   Skin: Negative.   Neurological: Negative.   Psychiatric/Behavioral: Negative for depression, hallucinations, memory loss, substance abuse and suicidal ideas. The patient is nervous/anxious. The patient does not have insomnia.    Blood pressure (!) 134/91, pulse 86, temperature 99 F (37.2 C), temperature source Oral, resp. rate 18, height 6\' 4"  (1.93 m), weight 120.8 kg, SpO2 97 %. Body mass index is 32.42 kg/m.  Treatment Plan Summary: Daily contact with patient to assess and evaluate symptoms and progress in treatment, Medication management and Plan I am going to hope that we may be seeing a trend towards improvement.  I advised the patient that what really brought him in the hospital was his behavior both at home and in coming to the emergency room which are frightening to his mother and have ruined his ability to function normally.  I told him that I believe that he was having a bad feeling and that it was our job to try and find a way to help him to live normally with this feeling if  necessary given that we cannot find a physiologic way to treat it.  Not sure that he understood that but at least he did not lose his temper over it.  He has improvement reinforces from a my hypothesis that this may be more of an OCD-like condition as that would be expected to improve with the Pristiq.  Continue current medication with Ativan as needed if he becomes agitated again.  I will reassess tomorrow and we will reconsider the necessity of inpatient hospitalization.  Disposition: Recommend psychiatric Inpatient admission when medically cleared. Supportive therapy provided about ongoing stressors. Discussed crisis plan, support from social network, calling 911, coming to the Emergency Department, and  calling Suicide Hotline.  Mordecai Rasmussen, MD 09/16/2020 1:00 PM

## 2020-09-17 DIAGNOSIS — F22 Delusional disorders: Secondary | ICD-10-CM | POA: Diagnosis not present

## 2020-09-17 MED ORDER — SODIUM CHLORIDE 0.9 % IV SOLN: INTRAVENOUS | Status: DC

## 2020-09-17 MED ORDER — CLONAZEPAM 0.5 MG PO TABS
0.5000 mg | ORAL_TABLET | Freq: Two times a day (BID) | ORAL | Status: DC
  Administered 2020-09-17 – 2020-09-18 (×3): 0.5 mg via ORAL
  Filled 2020-09-17 (×3): qty 1

## 2020-09-17 NOTE — Progress Notes (Signed)
Patient transferred off floor via wheelchair by staff and security. Patient on ra. No distress. Patient calm and cooperative.

## 2020-09-17 NOTE — Progress Notes (Signed)
Pt admitted to 1A144, oriented to room. Pt calm and cooperative at this time.

## 2020-09-17 NOTE — Consult Note (Signed)
[facility] Face-to-Face Psychiatry Consult   Reason for Consult: Follow-up consult with 20 year old man with nearly delusional or delusional believes that he is poisoned by radiation which has caused extreme anxiety and agitation.  Patient has had a hard time cognitively relating to the possibility that his problem is anxiety and working to get it under control. Referring Physician: Margo Aye Patient Identification: Connor [name] MRN:  [id number] Principal Diagnosis: Delusional disorder [facility]) Diagnosis:  Principal Problem:   Delusional disorder (HCC) Active Problems:   Neurofibromatosis, type I (von Recklinghausen's disease) (HCC)   Somatic symptom disorder   Acute dystonic reaction due to drugs   Total Time spent with patient: 30 minutes  Subjective:   Connor [name] is a 20 y.o. male patient admitted with "my burning is 10 out of 10".  HPI: As is usually the case patient answered questions about his physical symptoms by continuing to say that his burning sensation was 10 out of 10 and as bad as it could possibly be everywhere in his body.  He is laying in bed comfortably able to move all limbs does not look as though he is distressed by his body or in any pain at all.  His affect today when I saw him was calm.  He denies suicidal or homicidal ideation.  He tells me that since I saw him yesterday he has still had some spells of agitation and shouting but less than he did before.  Checking the MAR it looks like he still needed to get some as needed Ativan this morning.  He is being compliant with his oral medicine  Past Psychiatric History: Past history of autistic spectrum disorder possible ADHD when he was younger  Risk to Self:   Risk to Others:   Prior Inpatient Therapy:   Prior Outpatient Therapy:    Past Medical History:  Past Medical History:  Diagnosis Date  . ADHD (attention deficit hyperactivity disorder)   . Autism   . Depressed   . Neurofibromatosis (HCC)     Type 1  . Obesity   . Pertussis    as a infant    Past Surgical History:  Procedure Laterality Date  . MRI    . RADIOLOGY WITH ANESTHESIA N/A 08/13/2012   Procedure: RADIOLOGY WITH ANESTHESIA;  Surgeon: Medication Radiologist, MD;  Location: [facility] OR;  Service: Radiology;  Laterality: N/A;  MRI    Family History:  Family History  Problem Relation Age of Onset  . Anxiety disorder Mother   . Depression Mother   . OCD Mother   . Hypertension Mother   . Cancer Maternal Aunt   . Cancer Maternal Grandmother   . Hypertension Father   . Schizophrenia Maternal Uncle    Family Psychiatric  History: Anxiety and depression and OCD in mother schizophrenia and some other family members Social History:  Social History   Substance and Sexual Activity  Alcohol Use No  . Alcohol/week: 0.0 standard drinks     Social History   Substance and Sexual Activity  Drug Use No    Social History   Socioeconomic History  . Marital status: Single    Spouse name: Not on file  . Number of children: Not on file  . Years of education: Not on file  . Highest education level: Not on file  Occupational History  . Not on file  Tobacco Use  . Smoking status: Former Smoker    Packs/day: 1.00    Years: 1.00    Pack  years: 1.00    Types: Cigarettes    Quit date: 12/31/2014    Years since quitting: 5.7  . Smokeless tobacco: Never Used  Substance and Sexual Activity  . Alcohol use: No    Alcohol/week: 0.0 standard drinks  . Drug use: No  . Sexual activity: Not Currently    Birth control/protection: None  Other Topics Concern  . Not on file  Social History Narrative   Connor [name] is a high school drop out.   He lives with his mom only. He has one sister.   He enjoys eating, sleeping, and watching tv.   Social Determinants of Health   Financial Resource Strain: Not on file  Food Insecurity: Not on file  Transportation Needs: Not on file  Physical Activity: Not on file  Stress: Not on file  Social  Connections: Not on file   Additional Social History:    Allergies:   Allergies  Allergen Reactions  . Haldol [Haloperidol] Other (See Comments)    Dystonia      Labs:  Results for orders placed or performed during the hospital encounter of 09/12/20 (from the past 48 hour(s))  CK     Status: Abnormal   Collection Time: 09/16/20  1:04 PM  Result Value Ref Range   Total CK 634 (H) 49 - 397 U/L    Comment: Performed at [facility], [address] [place], [place] [address]    Current Facility-Administered Medications  Medication Dose Route Frequency Provider Last Rate Last Admin  . 0.9 %  sodium chloride infusion   Intravenous Continuous Darlin DropHall, Carole N, DO 125 mL/hr at 09/17/20 1201 Infusion Verify at 09/17/20 1201  . acetaminophen (TYLENOL) tablet 650 mg  650 mg Oral Q6H PRN Charlsie QuestPatel, Vishal R, MD       Or  . acetaminophen (TYLENOL) suppository 650 mg  650 mg Rectal Q6H PRN Charlsie QuestPatel, Vishal R, MD      . alum & mag hydroxide-simeth (MAALOX/MYLANTA) 200-200-20 MG/5ML suspension 30 mL  30 mL Oral Q6H PRN Humberto Addo T, MD      . carbamazepine (TEGRETOL) tablet 200 mg  200 mg Oral BID Asyia Hornung, Jackquline DenmarkJohn T, MD   200 mg at 09/17/20 0815  . clonazePAM (KLONOPIN) tablet 0.5 mg  0.5 mg Oral BID Keyonta Madrid T, MD      . desvenlafaxine (PRISTIQ) 24 hr tablet 100 mg  100 mg Oral Daily Ashya Nicolaisen, Jackquline DenmarkJohn T, MD   100 mg at 09/17/20 0816  . diphenhydrAMINE (BENADRYL) injection 50 mg  50 mg Intramuscular Q2H PRN Kynsie Falkner, Jackquline DenmarkJohn T, MD   50 mg at 09/14/20 1949  . enoxaparin (LOVENOX) injection 60 mg  0.5 mg/kg Subcutaneous Q24H Ronnald Rampatel, Kishan S, RPH   60 mg at 09/16/20 2112  . feeding supplement (ENSURE ENLIVE / ENSURE PLUS) liquid 237 mL  237 mL Oral TID BM Dow AdolphHall, Carole N, DO   237 mL at 09/16/20 2110  . ibuprofen (ADVIL) tablet 600 mg  600 mg Oral Q6H PRN Donn Wilmot, Jackquline DenmarkJohn T, MD   600 mg at 09/15/20 2112  . LORazepam (ATIVAN) injection 2 mg  2 mg Intravenous Q4H PRN Tomaz Janis, Jackquline DenmarkJohn T, MD   2 mg at 09/17/20  0816  . metoprolol tartrate (LOPRESSOR) injection 2.5 mg  2.5 mg Intravenous Q4H PRN Dow AdolphHall, Carole N, DO   2.5 mg at 09/14/20 1031  . multivitamin with minerals tablet 1 tablet  1 tablet Oral Daily Dow AdolphHall, Carole N, DO   1 tablet at 09/17/20 0815  .  pantoprazole (PROTONIX) EC tablet 40 mg  40 mg Oral Daily Latayna Ritchie, Jackquline Denmark, MD   40 mg at 09/17/20 0815  . polyethylene glycol (MIRALAX / GLYCOLAX) packet 17 g  17 g Oral Daily Dow Adolph N, DO   17 g at 09/16/20 7342  . senna (SENOKOT) tablet 17.2 mg  2 tablet Oral BID Dow Adolph N, DO   17.2 mg at 09/16/20 2109  . sodium chloride flush (NS) 0.9 % injection 3 mL  3 mL Intravenous Q12H Darreld Mclean R, MD   3 mL at 09/17/20 0827  . sucralfate (CARAFATE) 1 GM/10ML suspension 1 g  1 g Oral QID PRN Dow Adolph N, DO   1 g at 09/17/20 8768    Musculoskeletal: Strength & Muscle Tone: within normal limits Gait & Station: normal Patient leans: N/A            Psychiatric Specialty Exam:  Presentation  General Appearance: No data recorded Eye Contact:No data recorded Speech:No data recorded Speech Volume:No data recorded Handedness:No data recorded  Mood and Affect  Mood:No data recorded Affect:No data recorded  Thought Process  Thought Processes:No data recorded Descriptions of Associations:No data recorded Orientation:No data recorded Thought Content:No data recorded History of Schizophrenia/Schizoaffective disorder:No  Duration of Psychotic Symptoms:Less than six months  Hallucinations:No data recorded Ideas of Reference:No data recorded Suicidal Thoughts:No data recorded Homicidal Thoughts:No data recorded  Sensorium  Memory:No data recorded Judgment:No data recorded Insight:No data recorded  Executive Functions  Concentration:No data recorded Attention Span:No data recorded Recall:No data recorded Fund of Knowledge:No data recorded Language:No data recorded  Psychomotor Activity  Psychomotor Activity:No data  recorded  Assets  Assets:No data recorded  Sleep  Sleep:No data recorded  Physical Exam: Physical Exam Vitals and nursing note reviewed.  Constitutional:      Appearance: Normal appearance.  HENT:     Head: Normocephalic and atraumatic.     Mouth/Throat:     Pharynx: Oropharynx is clear.  Eyes:     Pupils: Pupils are equal, round, and reactive to light.  Cardiovascular:     Rate and Rhythm: Normal rate and regular rhythm.  Pulmonary:     Effort: Pulmonary effort is normal.     Breath sounds: Normal breath sounds.  Abdominal:     General: Abdomen is flat.     Palpations: Abdomen is soft.  Musculoskeletal:        General: Normal range of motion.  Skin:    General: Skin is warm and dry.  Neurological:     General: No focal deficit present.     Mental Status: He is alert. Mental status is at baseline.  Psychiatric:        Attention and Perception: Attention normal.        Mood and Affect: Affect is blunt.        Speech: Speech is delayed.        Behavior: Behavior is slowed.        Thought Content: Thought content is delusional. Thought content does not include homicidal or suicidal ideation.        Cognition and Memory: Cognition is impaired.        Judgment: Judgment is inappropriate.    Review of Systems  Constitutional: Negative.   HENT: Negative.   Eyes: Negative.   Respiratory: Negative.   Cardiovascular: Negative.   Gastrointestinal: Negative.   Musculoskeletal: Negative.   Skin: Negative.   Neurological: Negative.   Psychiatric/Behavioral: Negative for depression, memory loss, substance abuse and  suicidal ideas. The patient is nervous/anxious. The patient does not have insomnia.    Blood pressure (!) 129/92, pulse 80, temperature 98.8 F (37.1 C), temperature source Oral, resp. rate (!) 22, height 6\' 4"  (1.93 m), weight 120.8 kg, SpO2 97 %. Body mass index is 32.42 kg/m.  Treatment Plan Summary: Medication management and Plan He seems like overall he  is better with a better baseline and fewer episodes of shouting yelling agitation.  Nevertheless insight is still poor.  I spoke with him today about how our goal is to get him to be able to go home and not keep coming to the emergency room or calling 911 or losing his temper with his mother over his current complaint.  Noted that the complaint he has had may not be likely to change but that we are working with him to improve his ability to tolerate the distress around it.  He said he understood although I am not sure if he really did.  I am going to make 1 change by adding oral clonazepam today.  I think this patient may be somebody in whom standing benzodiazepines would be beneficial.  They certainly seem to have helped him in the hospital.  I am going to put him on some Klonopin 0.5 mg twice a day which is a low dose to start with.  No change to other medicines.  We have been waiting for a psychiatric bed and I will continue the IVC although if his behavior is no worse tomorrow I will probably talk with the patient and his wife about the possibility of discharge.  This could be justified certainly because now he is compliant with his medicine and his overall behavior and symptoms have improved somewhat even if he will not acknowledge it.  Disposition: Recommend psychiatric Inpatient admission when medically cleared. Supportive therapy provided about ongoing stressors.  , MD 09/17/2020 12:21 PM

## 2020-09-17 NOTE — Progress Notes (Addendum)
PROGRESS NOTE  Connor [name] VOJ:[id number] DOB: 01-27-2001 DOA: 09/12/2020 PCP: Abram Sander, MD  HPI/Recap of past 24 hours: Connor [name] is a 20 y.o. male with medical history significant for Autism spectrum disorder, delusional disorder, neurofibromatosis who was brought to the ED on 09/12/2020 with delusions, anxiety, and aggressive behavior.  He was reported to have expressed intention to cause physical harm to his mother as well as suicidal statements.  He was seen by psychiatry and placed under IVC with plan to admit to inpatient psychiatric ward pending bed availability.  He was placed on IM Ativan 2 mg every 6 hours as needed for anxiety, IM Haldol 10 mg every 6 hours as needed for agitation, and IM Benadryl every 6 hours as needed to be given with IM Haldol.  He received doses of these at 1735 on 3/22 and again earlier today at 1108.  He did also receive a dose of IM Geodon 20 mg at 0128.  Around 1700 on 09/13/20, nursing found him standing in the bathroom drooling.  Patient stated that he cannot breathe.  He was noted to be diaphoretic and pale.  He had torticolic twisting of his neck towards the left.  Vitals showed tachycardia with rates >160.  EKG was obtained and showed SVT with a rate 184 bpm.  Patient was given 2 mg IM Ativan and 50 mg IM Benadryl.  He was given 1 L LR fluid bolus.  Repeat EKG showed sinus tachycardia with rate down to 142.  He was reevaluated by psychiatry who feel this is a severe dystonic reaction with torticollis and less likely to be neuroleptic malignant syndrome.  Labs were obtained and notable for leukocytosis with WBC 16.8K and CK 1259.  Creatinine 1.22 compared to 0.72 09/05/2020.  Potassium 3.8, bicarb 16.  Given elevated CK and SVT, the hospitalist service was consulted to admit for further evaluation and management.  MRI brain without contrast done on 09/13/2020 revealed normal brain imaging  At time of Dr. Eliane Decree  evaluation, patient is resting supine in bed.  He is awake and alert.  He states he was exposed to radiation via CT scan 71 days ago.  He reports feeling like he has some muscle cramping.  He denies any nausea, vomiting, abdominal pain.  He is moving all extremities equally without rigidity.  Affect is flat.  Seen by psychiatry.  Recommends inpatient psych placement.  Vital signs and labs reviewed.  The patient is medically cleared for discharge to inpatient psych.  TOC consulted to assist with placement.  Updated his mother at bedside.  All questions answered to the best of my ability.  09/17/20: Patient was seen at his bedside.  His mother was present.  Alert and delusional.  He states he has had minimal oral intake.  We will start IV fluid.   Assessment/Plan: Principal Problem:   Delusional disorder (HCC) Active Problems:   Neurofibromatosis, type I (von Recklinghausen's disease) (HCC)   Somatic symptom disorder   Acute dystonic reaction due to drugs  Acute dystonic reaction with elevated CK: Suspected severe acute dystonic reaction on admission secondary to antipsychotic medication.  He did receive IM Haldol and Geodon within the last 24 hours.  Initial CK >1200.  Psychiatry feel this is less likely to be neuroleptic malignant syndrome. Received IV fluid hydration. Urine analysis nitrite negative, budding yeast present.   Management per psychiatry.  Resolved AKI, likely prerenal in the setting of dehydration from poor oral intake At  baseline creatinine 0.7 with GFR greater than 60 Currently back to his baseline Presented with creatinine 1.2  Avoid nephrotoxic agents Continue IV fluid hydration Monitor urine output  Resolved post IV hydration.  SVT: Transient SVT in setting of acute dystonic reaction.   Has received IV fluid hydration, continue. Continue to monitor on telemetry.  Resolved leukocytosis, likely reactive Not on antibiotics.  Delusional disorder/psychosis with  agitation/aggressive behavior: Psychiatry following, appreciate assistance.   Under IVC with plans for inpatient psychiatry admission once medically stable and bed available. -Continue IV Ativan 2 mg IM q6h as needed anxiety per psychiatry -Avoid further neuroleptics due to severe dystonic reaction Rest of management per psychiatry. Seen by psychiatry.  Recommends inpatient psych placement.  Vital signs and labs reviewed.  The patient is medically cleared for discharge to inpatient psych.  TOC consulted to assist with placement.    Elevated CPK likely from acute dystonia Down trended Repeat CPK.  DVT prophylaxis: Lovenox subcu daily Code Status: Full code Family Communication:  Updated the mother at bedside on 09/15/2020.    Disposition Plan: From home, dispo to inpatient psychiatry when a bed is available. Consults called: Psychiatry Level of care: Progressive Cardiac, downgrade to MedSurg.  Admission status:  Status is: Inpatient.    Dispo: The patient is from: Home  Anticipated d/c is to: Inpatient psychiatry patient is currently stable for discharge to inpatient psych.              Difficult to place patient Yes      Objective: Vitals:   09/17/20 0522 09/17/20 0828 09/17/20 1150 09/17/20 1227  BP: 117/78 (!) 129/92 133/88   Pulse: 74 80 79   Resp: 18 (!) 22    Temp: 98.6 F (37 C) 98.8 F (37.1 C) 98.6 F (37 C)   TempSrc: Oral Oral Oral Oral  SpO2: 97% 97% 96%   Weight:      Height:        Intake/Output Summary (Last 24 hours) at 09/17/2020 1533 Last data filed at 09/17/2020 1345 Gross per 24 hour  Intake 1156.82 ml  Output --  Net 1156.82 ml   Filed Weights   09/12/20 1344 09/14/20 0842  Weight: 122 kg 120.8 kg    Exam: No significant changes from prior exam.  . General: 20 y.o. year-old male well-developed well-nourished in no acute distress.  He is alert and delusional.   . Cardiovascular: Regular rate and rhythm no rubs or  gallops.   Marland Kitchen Respiratory: Clear to auscultation no wheezes or rales.   . Abdomen: Soft nontender normal bowel sounds present.   . Musculoskeletal: No lower extremity edema bilaterally.   . Skin: No ulcerative lesions noted. Marland Kitchen Psychiatry: Flat Affect.  Data Reviewed: CBC: Recent Labs  Lab 09/13/20 1800 09/14/20 0509 09/15/20 0647  WBC 16.8* 10.6* 7.6  NEUTROABS 12.6*  --   --   HGB 17.2* 15.5 15.2  HCT 51.7 44.9 45.0  MCV 87.8 86.2 87.9  PLT 428* 311 266   Basic Metabolic Panel: Recent Labs  Lab 09/13/20 1800 09/14/20 0509 09/15/20 0647  NA 138 139 137  K 3.8 3.1* 4.0  CL 103 104 104  CO2 16* 24 26  GLUCOSE 150* 106* 91  BUN CREATININE 1.22 0.73 0.73  CALCIUM 9.8 9.0 9.2  MG 2.1  --   --    GFR: Estimated Creatinine Clearance: 205.7 mL/min (by C-G formula based on SCr of 0.73 mg/dL). Liver Function Tests: Recent Labs  Lab 09/13/20 1800  AST 59*  ALT 31  ALKPHOS 74  BILITOT 0.7  PROT 8.8*  ALBUMIN 5.5*   No results for input(s): LIPASE, AMYLASE in the last 168 hours. No results for input(s): AMMONIA in the last 168 hours. Coagulation Profile: No results for input(s): INR, PROTIME in the last 168 hours. Cardiac Enzymes: Recent Labs  Lab 09/13/20 1800 09/14/20 0509 09/15/20 0647 09/16/20 1304  CKTOTAL 1,259* 873* 583* 634*   BNP (last 3 results) No results for input(s): PROBNP in the last 8760 hours. HbA1C: No results for input(s): HGBA1C in the last 72 hours. CBG: No results for input(s): GLUCAP in the last 168 hours. Lipid Profile: No results for input(s): CHOL, HDL, LDLCALC, TRIG, CHOLHDL, LDLDIRECT in the last 72 hours. Thyroid Function Tests: No results for input(s): TSH, T4TOTAL, FREET4, T3FREE, THYROIDAB in the last 72 hours. Anemia Panel: No results for input(s): VITAMINB12, FOLATE, FERRITIN, TIBC, IRON, RETICCTPCT in the last 72 hours. Urine analysis:    Component Value Date/Time   COLORURINE YELLOW (A) 09/01/2020 1345    APPEARANCEUR TURBID (A) 09/01/2020 1345   APPEARANCEUR Clear 08/03/2012 0930   LABSPEC 1.029 09/01/2020 1345   LABSPEC 1.035 08/03/2012 0930   PHURINE 5.0 09/01/2020 1345   GLUCOSEU NEGATIVE 09/01/2020 1345   GLUCOSEU Negative 08/03/2012 0930   HGBUR NEGATIVE 09/01/2020 1345   BILIRUBINUR NEGATIVE 09/01/2020 1345   BILIRUBINUR Negative 08/03/2012 0930   KETONESUR 80 (A) 09/01/2020 1345   PROTEINUR 30 (A) 09/01/2020 1345   NITRITE NEGATIVE 09/01/2020 1345   LEUKOCYTESUR NEGATIVE 09/01/2020 1345   LEUKOCYTESUR Negative 08/03/2012 0930   Sepsis Labs: @LABRCNTIP (procalcitonin:4,lacticidven:4)  ) Recent Results (from the past 240 hour(s))  Resp Panel by RT-PCR (Flu A&B, Covid) Nasopharyngeal Swab     Status: None   Collection Time: 09/12/20  2:24 PM   Specimen: Nasopharyngeal Swab; Nasopharyngeal(NP) swabs in vial transport medium  Result Value Ref Range Status   SARS Coronavirus 2 by RT PCR NEGATIVE NEGATIVE Final    Comment: (NOTE) SARS-CoV-2 target nucleic acids are NOT DETECTED.  The SARS-CoV-2 RNA is generally detectable in upper respiratory specimens during the acute phase of infection. The lowest concentration of SARS-CoV-2 viral copies this assay can detect is 138 copies/mL. A negative result does not preclude SARS-Cov-2 infection and should not be used as the sole basis for treatment or other patient management decisions. A negative result may occur with  improper specimen collection/handling, submission of specimen other than nasopharyngeal swab, presence of viral mutation(s) within the areas targeted by this assay, and inadequate number of viral copies(<138 copies/mL). A negative result must be combined with clinical observations, patient history, and epidemiological information. The expected result is Negative.  Fact Sheet for Patients:  09/14/20  Fact Sheet for Healthcare Providers:   BloggerCourse.com  This test is no t yet approved or cleared by the [place] FDA and  has been authorized for detection and/or diagnosis of SARS-CoV-2 by FDA under an Emergency Use Authorization (EUA). This EUA will remain  in effect (meaning this test can be used) for the duration of the COVID-19 declaration under Section 564(b)(1) of the Act, 21 U.S.C.section 360bbb-3(b)(1), unless the authorization is terminated  or revoked sooner.       Influenza A by PCR NEGATIVE NEGATIVE Final   Influenza B by PCR NEGATIVE NEGATIVE Final    Comment: (NOTE) The Xpert Xpress SARS-CoV-2/FLU/RSV plus assay is intended as an aid in the diagnosis of influenza from Nasopharyngeal swab specimens and should not  be used as a sole basis for treatment. Nasal washings and aspirates are unacceptable for Xpert Xpress SARS-CoV-2/FLU/RSV testing.  Fact Sheet for Patients: BloggerCourse.com  Fact Sheet for Healthcare Providers: SeriousBroker.it  This test is not yet approved or cleared by the [place] FDA and has been authorized for detection and/or diagnosis of SARS-CoV-2 by FDA under an Emergency Use Authorization (EUA). This EUA will remain in effect (meaning this test can be used) for the duration of the COVID-19 declaration under Section 564(b)(1) of the Act, 21 U.S.C. section 360bbb-3(b)(1), unless the authorization is terminated or revoked.  Performed at [facility], [address] [place], [place] [address]       Studies: No results found.  Scheduled Meds: . carbamazepine  200 mg Oral BID  . clonazePAM  0.5 mg Oral BID  . desvenlafaxine  100 mg Oral Daily  . enoxaparin (LOVENOX) injection  0.5 mg/kg Subcutaneous Q24H  . feeding supplement  237 mL Oral TID BM  . multivitamin with minerals  1 tablet Oral Daily  . pantoprazole  40 mg Oral Daily  . polyethylene glycol  17 g Oral Daily  . senna   2 tablet Oral BID  . sodium chloride flush  3 mL Intravenous Q12H    Continuous Infusions: . sodium chloride 125 mL/hr at 09/17/20 1201     LOS: 3 days     Darlin Drop, MD [facility] Pager [phone]  If 7PM-7AM, please contact night-coverage [email or web address] Password [facility] 09/17/2020, 3:33 PM    PROGRESS NOTE  Connor [name] ZTI:[id number] DOB: [birth year] DOA: 09/12/2020 PCP: Abram Sander, MD  HPI/Recap of past 24 hours: Connor [name] is a 20 y.o. male with medical history significant for Autism spectrum disorder, delusional disorder, neurofibromatosis who was brought to the ED on 09/12/2020 with delusions, anxiety, and aggressive behavior.  He was reported to have expressed intention to cause physical harm to his mother as well as suicidal statements.  He was seen by psychiatry and placed under IVC with plan to admit to inpatient psychiatric ward pending bed availability.  He was placed on IM Ativan 2 mg every 6 hours as needed for anxiety, IM Haldol 10 mg every 6 hours as needed for agitation, and IM Benadryl every 6 hours as needed to be given with IM Haldol.  He received doses of these at 1735 on 3/22 and again earlier today at 1108.  He did also receive a dose of IM Geodon 20 mg at 0128.  Around 1700 on 09/13/20, nursing found him standing in the bathroom drooling.  Patient stated that he cannot breathe.  He was noted to be diaphoretic and pale.  He had torticolic twisting of his neck towards the left.  Vitals showed tachycardia with rates >160.  EKG was obtained and showed SVT with a rate 184 bpm.  Patient was given 2 mg IM Ativan and 50 mg IM Benadryl.  He was given 1 L LR fluid bolus.  Repeat EKG showed sinus tachycardia with rate down to 142.  He was reevaluated by psychiatry who feel this is a severe dystonic reaction with torticollis and less likely to be neuroleptic malignant syndrome.  Labs were obtained and notable for leukocytosis  with WBC 16.8K and CK 1259.  Creatinine 1.22 compared to 0.72 09/05/2020.  Potassium 3.8, bicarb 16.  Given elevated CK and SVT, the hospitalist service was consulted to admit for further evaluation and management.  MRI brain without contrast done on 09/13/2020  revealed normal brain imaging  At time of Dr. Eliane DecreePatel's evaluation, patient is resting supine in bed.  He is awake and alert.  He states he was exposed to radiation via CT scan 71 days ago.  He reports feeling like he has some muscle cramping.  He denies any nausea, vomiting, abdominal pain.  He is moving all extremities equally without rigidity.  Affect is flat.  09/14/20: Seen and examined at his bedside in the ED, he is alert and confused.  Assessment/Plan: Principal Problem:   Delusional disorder (HCC) Active Problems:   Neurofibromatosis, type I (von Recklinghausen's disease) (HCC)   Somatic symptom disorder   Acute dystonic reaction due to drugs  Acute dystonic reaction with elevated CK: Suspected severe acute dystonic reaction secondary to antipsychotic medication.  He did receive IM Haldol and Geodon within the last 24 hours.  CK >1200.  Psychiatry feel this is less likely to be neuroleptic malignant syndrome. -S/p 1 L LR bolus, will give additional bolus this evening -Check urinalysis, nitrite negative, budding yeast present.   -Monitor urine output -No further neuroleptic medications, Haldol has already been discontinued -Continue IM Benadryl 50 mg q2h prn per psychiatry -Rest of management per psychiatry.  Resolved AKI, likely prerenal in the setting of dehydration from poor oral intake At baseline creatinine 0.7 with GFR greater than 60 Currently back to his baseline Presented with creatinine 1.2  Avoid nephrotoxic agents Continue IV fluid hydration Monitor urine output  SVT: Transient SVT in setting of acute dystonic reaction.   Rate is improving but still in sinus tachycardia after receiving Ativan and 1 L  LR. Continue IV fluid Continue to monitor on telemetry.  Leukocytosis, likely reactive Leukocytosis is resolving, no antibiotics.  Continue to monitor off antibiotics.  Delusional disorder/psychosis with agitation/aggressive behavior: Psychiatry following, appreciate assistance.   Under IVC with plans for inpatient psychiatry admission once medically stable and bed available. -Continue IV Ativan 2 mg IM q6h as needed anxiety per psychiatry -Avoid further neuroleptics due to severe dystonic reaction Rest of management per psychiatry.  DVT prophylaxis: Lovenox subcu daily Code Status: Full code Family Communication:  Called the patient's mother on 09/14/2020, no answer will call again. Disposition Plan: From home, dispo likely to inpatient psychiatry when medically stable Consults called: Psychiatry Level of care: Progressive Cardiac  Admission status:  Status is: Observation  The patient remains OBS appropriate and will d/c before 2 midnights.  Dispo: The patient is from: Home  Anticipated d/c is to: Inpatient psychiatry  Patient currently is not medically stable to d/c.              Difficult to place patient Yes      Objective: Vitals:   09/17/20 0522 09/17/20 0828 09/17/20 1150 09/17/20 1227  BP: 117/78 (!) 129/92 133/88   Pulse: 74 80 79   Resp: 18 (!) 22    Temp: 98.6 F (37 C) 98.8 F (37.1 C) 98.6 F (37 C)   TempSrc: Oral Oral Oral Oral  SpO2: 97% 97% 96%   Weight:      Height:        Intake/Output Summary (Last 24 hours) at 09/17/2020 1533 Last data filed at 09/17/2020 1345 Gross per 24 hour  Intake 1156.82 ml  Output --  Net 1156.82 ml   Filed Weights   09/12/20 1344 09/14/20 0842  Weight: 122 kg 120.8 kg    Exam:  . General: 20 y.o. year-old male well developed well nourished in no acute distress.  Alert and confused. . Cardiovascular: Tachycardic with no rubs or gallops.  No thyromegaly or JVD noted.    Marland Kitchen Respiratory: Clear to auscultation with no wheezes or rales. Good inspiratory effort. . Abdomen: Soft nontender nondistended with normal bowel sounds x4 quadrants. . Musculoskeletal: No lower extremity edema. 2/4 pulses in all 4 extremities. . Skin: No ulcerative lesions noted or rashes, . Psychiatry: Flat affect.  Data Reviewed: CBC: Recent Labs  Lab 09/13/20 1800 09/14/20 0509 09/15/20 0647  WBC 16.8* 10.6* 7.6  NEUTROABS 12.6*  --   --   HGB 17.2* 15.5 15.2  HCT 51.7 44.9 45.0  MCV 87.8 86.2 87.9  PLT 428* 311 266   Basic Metabolic Panel: Recent Labs  Lab 09/13/20 1800 09/14/20 0509 09/15/20 0647  NA 138 139 137  K 3.8 3.1* 4.0  CL 103 104 104  CO2 16* 24 26  GLUCOSE 150* 106* 91  BUN CREATININE 1.22 0.73 0.73  CALCIUM 9.8 9.0 9.2  MG 2.1  --   --    GFR: Estimated Creatinine Clearance: 205.7 mL/min (by C-G formula based on SCr of 0.73 mg/dL). Liver Function Tests: Recent Labs  Lab 09/13/20 1800  AST 59*  ALT 31  ALKPHOS 74  BILITOT 0.7  PROT 8.8*  ALBUMIN 5.5*   No results for input(s): LIPASE, AMYLASE in the last 168 hours. No results for input(s): AMMONIA in the last 168 hours. Coagulation Profile: No results for input(s): INR, PROTIME in the last 168 hours. Cardiac Enzymes: Recent Labs  Lab 09/13/20 1800 09/14/20 0509 09/15/20 0647 09/16/20 1304  CKTOTAL 1,259* 873* 583* 634*   BNP (last 3 results) No results for input(s): PROBNP in the last 8760 hours. HbA1C: No results for input(s): HGBA1C in the last 72 hours. CBG: No results for input(s): GLUCAP in the last 168 hours. Lipid Profile: No results for input(s): CHOL, HDL, LDLCALC, TRIG, CHOLHDL, LDLDIRECT in the last 72 hours. Thyroid Function Tests: No results for input(s): TSH, T4TOTAL, FREET4, T3FREE, THYROIDAB in the last 72 hours. Anemia Panel: No results for input(s): VITAMINB12, FOLATE, FERRITIN, TIBC, IRON, RETICCTPCT in the last 72 hours. Urine analysis:     Component Value Date/Time   COLORURINE YELLOW (A) 09/01/2020 1345   APPEARANCEUR TURBID (A) 09/01/2020 1345   APPEARANCEUR Clear 08/03/2012 0930   LABSPEC 1.029 09/01/2020 1345   LABSPEC 1.035 08/03/2012 0930   PHURINE 5.0 09/01/2020 1345   GLUCOSEU NEGATIVE 09/01/2020 1345   GLUCOSEU Negative 08/03/2012 0930   HGBUR NEGATIVE 09/01/2020 1345   BILIRUBINUR NEGATIVE 09/01/2020 1345   BILIRUBINUR Negative 08/03/2012 0930   KETONESUR 80 (A) 09/01/2020 1345   PROTEINUR 30 (A) 09/01/2020 1345   NITRITE NEGATIVE 09/01/2020 1345   LEUKOCYTESUR NEGATIVE 09/01/2020 1345   LEUKOCYTESUR Negative 08/03/2012 0930   Sepsis Labs: (procalcitonin:4,lacticidven:4)  ) Recent Results (from the past 240 hour(s))  Resp Panel by RT-PCR (Flu A&B, Covid) Nasopharyngeal Swab     Status: None   Collection Time: 09/12/20  2:24 PM   Specimen: Nasopharyngeal Swab; Nasopharyngeal(NP) swabs in vial transport medium  Result Value Ref Range Status   SARS Coronavirus 2 by RT PCR NEGATIVE NEGATIVE Final    Comment: (NOTE) SARS-CoV-2 target nucleic acids are NOT DETECTED.  The SARS-CoV-2 RNA is generally detectable in upper respiratory specimens during the acute phase of infection. The lowest concentration of SARS-CoV-2 viral copies this assay can detect is 138 copies/mL. A negative result does not preclude SARS-Cov-2 infection and should not be used  as the sole basis for treatment or other patient management decisions. A negative result may occur with  improper specimen collection/handling, submission of specimen other than nasopharyngeal swab, presence of viral mutation(s) within the areas targeted by this assay, and inadequate number of viral copies(<138 copies/mL). A negative result must be combined with clinical observations, patient history, and epidemiological information. The expected result is Negative.  Fact Sheet for Patients:  BloggerCourse.com  Fact Sheet for  Healthcare Providers:  SeriousBroker.it  This test is no t yet approved or cleared by the [place] FDA and  has been authorized for detection and/or diagnosis of SARS-CoV-2 by FDA under an Emergency Use Authorization (EUA). This EUA will remain  in effect (meaning this test can be used) for the duration of the COVID-19 declaration under Section 564(b)(1) of the Act, 21 U.S.C.section 360bbb-3(b)(1), unless the authorization is terminated  or revoked sooner.       Influenza A by PCR NEGATIVE NEGATIVE Final   Influenza B by PCR NEGATIVE NEGATIVE Final    Comment: (NOTE) The Xpert Xpress SARS-CoV-2/FLU/RSV plus assay is intended as an aid in the diagnosis of influenza from Nasopharyngeal swab specimens and should not be used as a sole basis for treatment. Nasal washings and aspirates are unacceptable for Xpert Xpress SARS-CoV-2/FLU/RSV testing.  Fact Sheet for Patients: BloggerCourse.com  Fact Sheet for Healthcare Providers: SeriousBroker.it  This test is not yet approved or cleared by the [place] FDA and has been authorized for detection and/or diagnosis of SARS-CoV-2 by FDA under an Emergency Use Authorization (EUA). This EUA will remain in effect (meaning this test can be used) for the duration of the COVID-19 declaration under Section 564(b)(1) of the Act, 21 U.S.C. section 360bbb-3(b)(1), unless the authorization is terminated or revoked.  Performed at [facility], [address], [place], [place] [address]       Studies: No results found.  Scheduled Meds: . carbamazepine  200 mg Oral BID  . clonazePAM  0.5 mg Oral BID  . desvenlafaxine  100 mg Oral Daily  . enoxaparin (LOVENOX) injection  0.5 mg/kg Subcutaneous Q24H  . feeding supplement  237 mL Oral TID BM  . multivitamin with minerals  1 tablet Oral Daily  . pantoprazole  40 mg Oral Daily  . polyethylene glycol  17  g Oral Daily  . senna  2 tablet Oral BID  . sodium chloride flush  3 mL Intravenous Q12H    Continuous Infusions: . sodium chloride 125 mL/hr at 09/17/20 1201     LOS: 3 days     Darlin Drop, MD [facility] Pager [phone]  If 7PM-7AM, please contact night-coverage [email or web address] Password [facility] 09/17/2020, 3:33 PM

## 2020-09-18 ENCOUNTER — Encounter: Payer: Self-pay | Admitting: Psychiatry

## 2020-09-18 ENCOUNTER — Other Ambulatory Visit: Payer: Self-pay

## 2020-09-18 ENCOUNTER — Inpatient Hospital Stay
Admission: RE | Admit: 2020-09-18 | Discharge: 2020-09-22 | DRG: 885 | Disposition: A | Discharge status: Home / Self Care | Payer: Medicare Other | Source: Intra-hospital | Attending: Behavioral Health | Admitting: Behavioral Health

## 2020-09-18 DIAGNOSIS — Z79899 Other long term (current) drug therapy: Secondary | ICD-10-CM

## 2020-09-18 DIAGNOSIS — M797 Fibromyalgia: Secondary | ICD-10-CM | POA: Diagnosis present

## 2020-09-18 DIAGNOSIS — Z87891 Personal history of nicotine dependence: Secondary | ICD-10-CM | POA: Diagnosis not present

## 2020-09-18 DIAGNOSIS — G4700 Insomnia, unspecified: Secondary | ICD-10-CM | POA: Diagnosis present

## 2020-09-18 DIAGNOSIS — F840 Autistic disorder: Secondary | ICD-10-CM | POA: Diagnosis present

## 2020-09-18 DIAGNOSIS — F339 Major depressive disorder, recurrent, unspecified: Secondary | ICD-10-CM | POA: Diagnosis present

## 2020-09-18 DIAGNOSIS — Z818 Family history of other mental and behavioral disorders: Secondary | ICD-10-CM | POA: Diagnosis not present

## 2020-09-18 DIAGNOSIS — F22 Delusional disorders: Principal | ICD-10-CM | POA: Diagnosis present

## 2020-09-18 DIAGNOSIS — F410 Panic disorder [episodic paroxysmal anxiety] without agoraphobia: Secondary | ICD-10-CM | POA: Diagnosis present

## 2020-09-18 DIAGNOSIS — K219 Gastro-esophageal reflux disease without esophagitis: Secondary | ICD-10-CM | POA: Diagnosis present

## 2020-09-18 DIAGNOSIS — I10 Essential (primary) hypertension: Secondary | ICD-10-CM | POA: Diagnosis present

## 2020-09-18 LAB — CK: Total CK: 212 U/L (ref 49–397)

## 2020-09-18 MED ORDER — PANTOPRAZOLE SODIUM 40 MG PO TBEC: 40.0000 mg | DELAYED_RELEASE_TABLET | Freq: Every day | ORAL | 0 refills | Status: AC

## 2020-09-18 MED ORDER — CARBAMAZEPINE 200 MG PO TABS
200.0000 mg | ORAL_TABLET | Freq: Two times a day (BID) | ORAL | Status: DC
  Administered 2020-09-19 – 2020-09-20 (×3): 200 mg via ORAL
  Filled 2020-09-18 (×3): qty 1

## 2020-09-18 MED ORDER — ADULT MULTIVITAMIN W/MINERALS CH
1.0000 | ORAL_TABLET | Freq: Every day | ORAL | Status: DC
  Administered 2020-09-19 – 2020-09-22 (×4): 1 via ORAL
  Filled 2020-09-18 (×4): qty 1

## 2020-09-18 MED ORDER — POLYETHYLENE GLYCOL 3350 17 G PO PACK
17.0000 g | PACK | Freq: Every day | ORAL | Status: DC
  Administered 2020-09-19 – 2020-09-22 (×4): 17 g via ORAL
  Filled 2020-09-18 (×4): qty 1

## 2020-09-18 MED ORDER — ACETAMINOPHEN 325 MG PO TABS
650.0000 mg | ORAL_TABLET | Freq: Four times a day (QID) | ORAL | Status: DC | PRN
  Administered 2020-09-21: 650 mg via ORAL
  Filled 2020-09-18: qty 2

## 2020-09-18 MED ORDER — MAGNESIUM HYDROXIDE 400 MG/5ML PO SUSP
30.0000 mL | Freq: Every day | ORAL | Status: DC | PRN
  Administered 2020-09-21: 30 mL via ORAL
  Filled 2020-09-18: qty 30

## 2020-09-18 MED ORDER — CLONAZEPAM 0.5 MG PO TABS
0.5000 mg | ORAL_TABLET | Freq: Two times a day (BID) | ORAL | Status: DC
  Administered 2020-09-19 – 2020-09-22 (×7): 0.5 mg via ORAL
  Filled 2020-09-18 (×7): qty 1

## 2020-09-18 MED ORDER — DESVENLAFAXINE SUCCINATE ER 100 MG PO TB24
100.0000 mg | ORAL_TABLET | Freq: Every day | ORAL | Status: DC
  Administered 2020-09-19 – 2020-09-22 (×4): 100 mg via ORAL
  Filled 2020-09-18 (×6): qty 1

## 2020-09-18 MED ORDER — CLONAZEPAM 0.5 MG PO TABS: 0.5000 mg | ORAL_TABLET | Freq: Two times a day (BID) | ORAL | 0 refills | Status: DC

## 2020-09-18 MED ORDER — IBUPROFEN 600 MG PO TABS: 600.0000 mg | ORAL_TABLET | Freq: Four times a day (QID) | ORAL | Status: DC | PRN

## 2020-09-18 MED ORDER — ALUM & MAG HYDROXIDE-SIMETH 200-200-20 MG/5ML PO SUSP: 30.0000 mL | ORAL | Status: DC | PRN

## 2020-09-18 MED ORDER — PANTOPRAZOLE SODIUM 40 MG PO TBEC
40.0000 mg | DELAYED_RELEASE_TABLET | Freq: Every day | ORAL | Status: DC
  Administered 2020-09-19 – 2020-09-22 (×4): 40 mg via ORAL
  Filled 2020-09-18 (×4): qty 1

## 2020-09-18 MED ORDER — DIPHENHYDRAMINE HCL 50 MG/ML IJ SOLN: 50.0000 mg | INTRAMUSCULAR | Status: DC | PRN

## 2020-09-18 NOTE — BH Assessment (Signed)
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  Pt is currently under review with [facility]. 

## 2020-09-18 NOTE — Consult Note (Signed)
[facility] Face-to-Face Psychiatry Consult   Reason for Consult: Follow-up consult 20 year old man with a history of intense believes that his body has been injured by radiation.  Possibly psychotic possibly obsessive. Referring Physician: Margo Aye Patient Identification: Connor [name] MRN:  [id number] Principal Diagnosis: Delusional disorder [facility]) Diagnosis:  Principal Problem:   Delusional disorder (HCC) Active Problems:   Neurofibromatosis, type I (von Recklinghausen's disease) (HCC)   Somatic symptom disorder   Acute dystonic reaction due to drugs   Total Time spent with patient: 30 minutes  Subjective:   Connor [name] is a 20 y.o. male patient admitted with "I guess I am okay".  HPI: Patient seen for follow-up again today.  He was calm wide-awake lying in bed.  Told me at first he felt okay.  Denies suicidal thought or plan.  When asked about it still mentions that his pain is as bad as it ever was although he had not mentioned it at first.  I asked him to tell me if the pain was actually limiting him in any way.  I gave him the example of how uninjured shoulder might cause pain that prevents you from lifting her arm and ask him if there was any behavior he was unable to do currently because of the pain.  He could not think of anything specific.  Still believes in the pain but seems to have calm down a lot.  Much less agitated than previously.  Past Psychiatric History: Past history of a diagnosis of autistic spectrum disorder and ADHD with more recent onset of this kind of obsessive possibly psychotic symptom  Risk to Self:   Risk to Others:   Prior Inpatient Therapy:   Prior Outpatient Therapy:    Past Medical History:  Past Medical History:  Diagnosis Date  . ADHD (attention deficit hyperactivity disorder)   . Autism   . Depressed   . Neurofibromatosis (HCC)    Type 1  . Obesity   . Pertussis    as a infant    Past Surgical History:  Procedure  Laterality Date  . MRI    . RADIOLOGY WITH ANESTHESIA N/A 08/13/2012   Procedure: RADIOLOGY WITH ANESTHESIA;  Surgeon: Medication Radiologist, MD;  Location: [facility] OR;  Service: Radiology;  Laterality: N/A;  MRI    Family History:  Family History  Problem Relation Age of Onset  . Anxiety disorder Mother   . Depression Mother   . OCD Mother   . Hypertension Mother   . Cancer Maternal Aunt   . Cancer Maternal Grandmother   . Hypertension Father   . Schizophrenia Maternal Uncle    Family Psychiatric  History: Apparently mother has OCD depression and anxiety and there is schizophrenia in the family as well Social History:  Social History   Substance and Sexual Activity  Alcohol Use No  . Alcohol/week: 0.0 standard drinks     Social History   Substance and Sexual Activity  Drug Use No    Social History   Socioeconomic History  . Marital status: Single    Spouse name: Not on file  . Number of children: Not on file  . Years of education: Not on file  . Highest education level: Not on file  Occupational History  . Not on file  Tobacco Use  . Smoking status: Former Smoker    Packs/day: 1.00    Years: 1.00    Pack years: 1.00    Types: Cigarettes    Quit date: 12/31/2014  Years since quitting: 5.7  . Smokeless tobacco: Never Used  Substance and Sexual Activity  . Alcohol use: No    Alcohol/week: 0.0 standard drinks  . Drug use: No  . Sexual activity: Not Currently    Birth control/protection: None  Other Topics Concern  . Not on file  Social History Narrative   Connor [name] is a high school drop out.   He lives with his mom only. He has one sister.   He enjoys eating, sleeping, and watching tv.   Social Determinants of Health   Financial Resource Strain: Not on file  Food Insecurity: Not on file  Transportation Needs: Not on file  Physical Activity: Not on file  Stress: Not on file  Social Connections: Not on file   Additional Social History:    Allergies:    Allergies  Allergen Reactions  . Haldol [Haloperidol] Other (See Comments)    Dystonia      Labs:  Results for orders placed or performed during the hospital encounter of 09/12/20 (from the past 48 hour(s))  CK     Status: Abnormal   Collection Time: 09/16/20  1:04 PM  Result Value Ref Range   Total CK 634 (H) 49 - 397 U/L    Comment: Performed at [facility], [address] [place], [place] [address]  CK     Status: None   Collection Time: 09/18/20  5:49 AM  Result Value Ref Range   Total CK 212 49 - 397 U/L    Comment: Performed at [facility], [address] [place], [place] [address]    Current Facility-Administered Medications  Medication Dose Route Frequency Provider Last Rate Last Admin  . acetaminophen (TYLENOL) tablet 650 mg  650 mg Oral Q6H PRN Charlsie Quest, MD       Or  . acetaminophen (TYLENOL) suppository 650 mg  650 mg Rectal Q6H PRN Charlsie Quest, MD      . alum & mag hydroxide-simeth (MAALOX/MYLANTA) 200-200-20 MG/5ML suspension 30 mL  30 mL Oral Q6H PRN Clapacs, Jackquline Denmark, MD   30 mL at 09/17/20 2246  . carbamazepine (TEGRETOL) tablet 200 mg  200 mg Oral BID Clapacs, Jackquline Denmark, MD   200 mg at 09/18/20 0831  . clonazePAM (KLONOPIN) tablet 0.5 mg  0.5 mg Oral BID Clapacs, Jackquline Denmark, MD   0.5 mg at 09/18/20 0831  . desvenlafaxine (PRISTIQ) 24 hr tablet 100 mg  100 mg Oral Daily Clapacs, John T, MD   100 mg at 09/18/20 1027  . diphenhydrAMINE (BENADRYL) injection 50 mg  50 mg Intramuscular Q2H PRN Clapacs, Jackquline Denmark, MD   50 mg at 09/14/20 1949  . enoxaparin (LOVENOX) injection 60 mg  0.5 mg/kg Subcutaneous Q24H Ronnald Ramp, RPH   60 mg at 09/17/20 2206  . feeding supplement (ENSURE ENLIVE / ENSURE PLUS) liquid 237 mL  237 mL Oral TID BM Hall, Carole N, DO   237 mL at 09/18/20 0835  . ibuprofen (ADVIL) tablet 600 mg  600 mg Oral Q6H PRN Clapacs, Jackquline Denmark, MD   600 mg at 09/15/20 2112  . LORazepam (ATIVAN) injection 2 mg  2 mg Intravenous Q4H PRN  Clapacs, Jackquline Denmark, MD   2 mg at 09/18/20 1128  . metoprolol tartrate (LOPRESSOR) injection 2.5 mg  2.5 mg Intravenous Q4H PRN Dow Adolph N, DO   2.5 mg at 09/14/20 1031  . multivitamin with minerals tablet 1 tablet  1 tablet Oral Daily Dow Adolph  N, DO   1 tablet at 09/18/20 0831  . pantoprazole (PROTONIX) EC tablet 40 mg  40 mg Oral Daily Clapacs, Jackquline Denmark, MD   40 mg at 09/18/20 0831  . polyethylene glycol (MIRALAX / GLYCOLAX) packet 17 g  17 g Oral Daily Dow Adolph N, DO   17 g at 09/18/20 2956  . senna (SENOKOT) tablet 17.2 mg  2 tablet Oral BID Dow Adolph N, DO   17.2 mg at 09/18/20 0831  . sodium chloride flush (NS) 0.9 % injection 3 mL  3 mL Intravenous Q12H Darreld Mclean R, MD   3 mL at 09/17/20 0827  . sucralfate (CARAFATE) 1 GM/10ML suspension 1 g  1 g Oral QID PRN Dow Adolph N, DO   1 g at 09/17/20 2130    Musculoskeletal: Strength & Muscle Tone: within normal limits Gait & Station: normal Patient leans: N/A            Psychiatric Specialty Exam:  Presentation  General Appearance: No data recorded Eye Contact:No data recorded Speech:No data recorded Speech Volume:No data recorded Handedness:No data recorded  Mood and Affect  Mood:No data recorded Affect:No data recorded  Thought Process  Thought Processes:No data recorded Descriptions of Associations:No data recorded Orientation:No data recorded Thought Content:No data recorded History of Schizophrenia/Schizoaffective disorder:No  Duration of Psychotic Symptoms:Less than six months  Hallucinations:No data recorded Ideas of Reference:No data recorded Suicidal Thoughts:No data recorded Homicidal Thoughts:No data recorded  Sensorium  Memory:No data recorded Judgment:No data recorded Insight:No data recorded  Executive Functions  Concentration:No data recorded Attention Span:No data recorded Recall:No data recorded Fund of Knowledge:No data recorded Language:No data recorded  Psychomotor  Activity  Psychomotor Activity:No data recorded  Assets  Assets:No data recorded  Sleep  Sleep:No data recorded  Physical Exam: Physical Exam Vitals and nursing note reviewed.  Constitutional:      Appearance: Normal appearance.  HENT:     Head: Normocephalic and atraumatic.     Mouth/Throat:     Pharynx: Oropharynx is clear.  Eyes:     Pupils: Pupils are equal, round, and reactive to light.  Cardiovascular:     Rate and Rhythm: Normal rate and regular rhythm.  Pulmonary:     Effort: Pulmonary effort is normal.     Breath sounds: Normal breath sounds.  Abdominal:     General: Abdomen is flat.     Palpations: Abdomen is soft.  Musculoskeletal:        General: Normal range of motion.  Skin:    General: Skin is warm and dry.  Neurological:     General: No focal deficit present.     Mental Status: He is alert. Mental status is at baseline.  Psychiatric:        Attention and Perception: He is inattentive.        Mood and Affect: Mood is anxious. Affect is blunt.        Speech: Speech is delayed.        Behavior: Behavior is slowed.        Thought Content: Thought content is delusional. Thought content does not include homicidal or suicidal ideation.        Cognition and Memory: Memory is impaired.        Judgment: Judgment is inappropriate.    Review of Systems  Constitutional: Positive for malaise/fatigue.  HENT: Negative.   Eyes: Negative.   Respiratory: Negative.   Cardiovascular: Negative.   Gastrointestinal: Negative.   Musculoskeletal: Negative.   Skin:  Negative.   Neurological: Negative.   Psychiatric/Behavioral: Positive for depression. Negative for hallucinations, memory loss, substance abuse and suicidal ideas. The patient is nervous/anxious and has insomnia.    Blood pressure (!) 126/92, pulse 85, temperature 99 F (37.2 C), temperature source Oral, resp. rate 19, height 6\' 4"  (1.93 m), weight 120.8 kg, SpO2 96 %. Body mass index is 32.42  kg/m.  Treatment Plan Summary: Medication management and Plan Patient's behavior has improved.  He is not certainly ever actively trying to hurt himself or anyone else.  At worst his behavior would consist of shouting that was redirectable.  I do think he would benefit from inpatient psychiatric treatment.  I am still not sure whether it is better to classify his condition more as psychotic or obsessive-compulsive in nature.  He is not currently on any antipsychotics and I feel like the anxiety medication is helping.  I think that time away from his family environment and in a therapeutic environment would still be beneficial so I am recommending transfer to the psychiatric ward and continuing the IVC.  Disposition: Recommend psychiatric Inpatient admission when medically cleared. Supportive therapy provided about ongoing stressors.  Mordecai RasmussenJohn Clapacs, MD 09/18/2020 12:05 PM

## 2020-09-18 NOTE — Discharge Instructions (Signed)
Dystonic Reaction Dystonia is a condition that makes muscles contract without warning (muscle spasms). It can cause unwanted, uncomfortable jerking of muscle groups. This condition is rarely life-threatening, but it can be distressing. A dystonic reaction is a reaction that often happens after a particular medicine is given. What are the causes? This condition may be caused by:  Side effects of certain medicines. These reactions occur when the normal patterns of the nerve receptors are affected by the medicine. The imbalance causes multiple types of muscle spasm.  Nervous system disorders, such as: ? Stroke. ? Multiple sclerosis (MS). ? Cerebral palsy. ? Trauma to the brain. In some cases, the cause is not known (idiopathic). What increases the risk?  This condition is more likely to develop in people who take certain medicines, most often medicines that are used to treat psychiatric conditions or nausea. What are the signs or symptoms? Symptoms of this condition can vary. They may include:  Muscle twitches or spasms around your eyes (blepharospasm).  Foot cramping or dragging.  Pulling of your neck to one side (torticollis) or backward (retrocollis).  Muscles spasms of your face.  Spasms of your voice box (larynx).  Tremors.  Awkward and painful positions.  Muscle cramping after activity.  Spasm of your jaw muscles that makes it difficult to open your mouth. How is this diagnosed? This condition may be diagnosed based on:  Your symptoms, especially the patterns of the muscle contractions in your body and how your body responds to treatment.  Physical exam.  Medical history. You may have other tests if the cause of your condition is not known. How is this treated? The treatment of this condition depends on the underlying cause. It may include:  Slowly weaning you from a specific medication to see if your symptoms improve. This may be done if that medication is thought  to be causing the dystonic reaction.  Taking medicines that help relax the muscles.  Taking medicines that reverse the reaction (anticholinergics).  Physical therapy to improve muscle strength and movement.  Injecting the affected muscles with a chemical (botulinum) that blocks muscle spasms. This treatment can block spasms for a few days to a few months.  In severe cases, having surgery to implant an electrical device (deep brain simulator) to help override abnormal signals being sent to your muscles.   Follow these instructions at home:  Talk with your health care provider about avoiding the use of the medicine that causes the reaction.  Take over-the-counter and prescription medicines only as told by your health care provider.  Do physical therapy exercises at home as instructed by your physical therapist.  Do not drive or operate heavy machinery until your health care provider approves.  Use heat and massage to relieve pain as told by your health care provider.  Keep all follow-up visits as told by your health care provider. This is important.   Contact a health care provider if:  Your original symptoms return after treatment. Summary  Dystonia is a condition that makes muscles contract without warning (muscle spasms).  It can cause unwanted, uncomfortable jerking of muscle groups.  This condition is more likely to develop in people who take certain medicines, most often medicines that are used to treat psychiatric conditions or nausea.  Talk with your health care provider about avoiding the use of the medicine that causes the reaction. This information is not intended to replace advice given to you by your health care provider. Make sure you discuss any  questions you have with your health care provider. Document Revised: 02/25/2020 Document Reviewed: 02/25/2020 Elsevier Patient Education  2021 [employer]  

## 2020-09-18 NOTE — Progress Notes (Signed)
Tried to call report on prior shift, but per behavorial med charge nurse Maryelizabeth Kaufmann they are unable to take patient until second shift.

## 2020-09-18 NOTE — BH Assessment (Signed)
Pt accepted to [facility] BMU room 306

## 2020-09-18 NOTE — Discharge Summary (Signed)
Discharge Summary  Connor [name] NUU:[id number] DOB: 04-10-2001  PCP: Abram Sander, MD  Admit date: 09/12/2020 Discharge date: 09/18/2020  Time spent: 35 minutes.  Recommendations for Outpatient Follow-up:  1. Continue care at psychiatry inpatient unit.  Discharge Diagnoses:  Active Hospital Problems   Diagnosis Date Noted  . 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 Acute dystonic reaction due to drugs 09/13/2020  . 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    Discharge Condition: Stable.  Diet recommendation: Resume previous diet.  Vitals:   09/18/20 0755 09/18/20 1149  BP: (!) 133/99 (!) 126/92  Pulse: 87 85  Resp: 20 19  Temp: 98.2 F (36.8 C) 99 F (37.2 C)  SpO2: 99% 96%    History of present illness:  Connor Dandy IIIis a 20 y.o.malewith medical history significant forAutism spectrum disorder, delusional disorder, neurofibromatosiswho was brought to the ED on 09/12/2020 with delusions, anxiety, and aggressive behavior. He was reported to have expressed intention to cause physical harm to his mother as well as suicidal statements. He was seen by psychiatry and placed under IVC with plan to admit to inpatient psychiatric ward pending bed availability.  He was placed on IM Ativan 2 mg every 6 hours as needed for anxiety, IM Haldol 10 mg every 6 hours as needed for agitation, and IM Benadryl every 6 hours as needed to be given with IM Haldol. He received doses of these at 1735 on 3/22/22and again earlier on 09/13/20 at 1108. He did also receive a dose of IM Geodon 20 mg at 0128.  Around 1700 on 09/13/20, nursing found him standing in the bathroom drooling. Patient stated that he cannot breathe. He was noted to be diaphoretic and pale. He had torticolictwisting of his neck towards the left. Vitals  showed tachycardia with rates >160. EKG was obtained and showed SVT with a rate 184 bpm.  Patient was given 2 mg IM Ativan and 50 mg IM Benadryl. He was given 1 L LR fluid bolus. Repeat EKG showed sinus tachycardia with rate down to 142. He was reevaluated by psychiatry who felt this was a severe dystonic reaction with torticollis and less likely to be neuroleptic malignant syndrome. Labs were obtained and notable for leukocytosis with WBC 16.8K and CK 1259. Creatinine 1.22 compared to 0.72 09/05/2020. Potassium 3.8, bicarb 16.  Given elevated CK and SVT, the hospitalist service was consulted to admit for further evaluation and management. MRI brain without contrast done on 09/13/2020 revealed normal brain imaging.  At time of Dr. Eliane Decree evaluation, patient was resting supine in bed. Awake and alert. Stating he was exposed to radiation via CT scan 71 days ago. He reports feeling like he has some muscles burning everywhere.He is moving all extremities equally without rigidity. Affect is flat.  Seen by psychiatry.  Recommended inpatient psych placement.  Vital signs and labs reviewed.  The patient is medically cleared for discharge to inpatient psych.  TOC consulted to assist with placement.  Updated his mother.  All questions answered to the best of my ability.  09/18/20:  Patient was seen at his bedside.  The one-to-one sitter was present.  He is alert and delusional.  Complains of radiation burning pain all over his body.  Seen by psychiatry, recommended inpatient psych admission.  Labs and vital signs have been reviewed and are stable.  Patient is medically cleared for  discharge to inpatient psych.    Hospital Course:  Principal Problem:   Delusional disorder (HCC) Active Problems:   Neurofibromatosis, type I (von Recklinghausen's disease) (HCC)   Somatic symptom disorder   Acute dystonic reaction due to drugs  Resolved Acute dystonic reaction with elevated CK: Suspected  severe acute dystonic reaction on admission secondary to antipsychotic medications. He did receive IM Haldol and Geodon within a 24 hours period.   Initial CK >1200 on 09/13/20.  Received IV fluid hydration. CPK has normalized 212 on 09/18/20. Urine analysis nitrite negative, budding yeast present.   Management per psychiatry.  Resolved AKI, likely prerenal in the setting of dehydration from poor oral intake At baseline creatinine 0.7 with GFR greater than 60 Currently back to his baseline Presented with creatinine 1.2  Avoid nephrotoxic agents Avoid dehydration  Resolved post IV hydration: SVT: Transient SVT in setting of acute dystonic reaction and dehydration.  Has received IV fluid hydration.  Resolved leukocytosis, likely reactive Not on antibiotics.  Delusional disorder/psychosis with agitation/aggressive behavior: Seen by psychiatry, appreciate assistance.   -Under IVC with plans for inpatient psychiatry admission  -Management per psychiatry.  Resolved elevated CPK likely from acute dystonia CPK has normalized.   Code Status:Full code   Procedures:  None   Consultations:  .Psychiatry  Discharge Exam: BP (!) 126/92 (BP Location: Right Arm)   Pulse 85   Temp 99 F (37.2 C) (Oral)   Resp 19   Ht 6\' 4"  (1.93 m)   Wt 120.8 kg   SpO2 96%   BMI 32.42 kg/m  . General: 20 y.o. year-old male well developed well nourished in no acute distress.  Alert and delusional. . Cardiovascular: Regular rate and rhythm with no rubs or gallops.  No thyromegaly or JVD noted.   Marland Kitchen. Respiratory: Clear to auscultation with no wheezes or rales. Good inspiratory effort. . Abdomen: Soft nontender nondistended with normal bowel sounds x4 quadrants. . Musculoskeletal: No lower extremity edema bilaterally. . Skin: No ulcerative lesions noted or rashes. . Psychiatry: Mood is appropriate for condition and setting  Discharge Instructions You were cared for by a hospitalist during  your hospital stay. If you have any questions about your discharge medications or the care you received while you were in the hospital after you are discharged, you can call the unit and asked to speak with the hospitalist on call if the hospitalist that took care of you is not available. Once you are discharged, your primary care physician will handle any further medical issues. Please note that NO REFILLS for any discharge medications will be authorized once you are discharged, as it is imperative that you return to your primary care physician (or establish a relationship with a primary care physician if you do not have one) for your aftercare needs so that they can reassess your need for medications and monitor your lab values.   Allergies as of 09/18/2020      Reactions   Haldol [haloperidol] Other (See Comments)   Dystonia       Medication List    STOP taking these medications   gabapentin 300 MG capsule Commonly known as: NEURONTIN   LORazepam 1 MG tablet Commonly known as: ATIVAN   omeprazole 40 MG capsule Commonly known as: PRILOSEC   ziprasidone 80 MG capsule Commonly known as: GEODON     TAKE these medications   carbamazepine 200 MG tablet Commonly known as: TEGRETOL Take 400 mg by mouth at bedtime.   carbamazepine 200 MG  tablet Commonly known as: TEGRETOL Take 200 mg by mouth daily.   clonazePAM 0.5 MG tablet Commonly known as: KLONOPIN Take 1 tablet (0.5 mg total) by mouth 2 (two) times daily for 10 days.   desvenlafaxine 100 MG 24 hr tablet Commonly known as: PRISTIQ Take 100 mg by mouth daily.   econazole nitrate 1 % cream Apply topically 2 (two) times daily for 14 days. Apply to red rash in groin   Multivitamin Gummies Mens Chew Chew 1 tablet by mouth daily.   pantoprazole 40 MG tablet Commonly known as: PROTONIX Take 1 tablet (40 mg total) by mouth daily. Start taking on: September 19, 2020   polyethylene glycol powder 17 GM/SCOOP powder Commonly known  as: GLYCOLAX/MIRALAX Take 255 g by mouth daily. What changed:   when to take this  reasons to take this   sucralfate 1 GM/10ML suspension Commonly known as: CARAFATE Take 10 mLs by mouth 4 (four) times daily as needed (stomach).   triamcinolone 0.1 % Commonly known as: KENALOG Apply 1 application topically 3 (three) times daily as needed (skin irritation or burns).      Allergies  Allergen Reactions  . Haldol [Haloperidol] Other (See Comments)    Dystonia      Follow-up Information    Abram Sander, MD. Call in 1 day(s).   Specialty: Family Medicine Why: Please call for a post hospital follow-up appointment. Contact information: [address] [place] [address] [phone]                The results of significant diagnostics from this hospitalization (including imaging, microbiology, ancillary and laboratory) are listed below for reference.    Significant Diagnostic Studies: MR BRAIN WO CONTRAST  Result Date: 09/13/2020 CLINICAL DATA:  Initial evaluation for acute psychosis. EXAM: MRI HEAD WITHOUT CONTRAST TECHNIQUE: Multiplanar, multiecho pulse sequences of the brain and surrounding structures were obtained without intravenous contrast. COMPARISON:  Previous brain MRI from 08/13/2012. FINDINGS: Brain: Cerebral volume within normal limits for age. A few scattered punctate foci of FLAIR hyperintensity noted within the periventricular white matter of both frontal lobes, nonspecific, but felt to be within normal limits for age and of doubtful significance. Suspected small left frontal DVA noted as well. No abnormal foci of restricted diffusion to suggest acute or subacute ischemia. Gray-white matter differentiation maintained. No encephalomalacia to suggest chronic cortical infarction. No foci of susceptibility artifact to suggest acute or chronic intracranial hemorrhage. No mass lesion, midline shift or mass effect. Ventricles normal size without hydrocephalus.  No extra-axial fluid collection. Pituitary gland suprasellar region within normal limits. Midline structures intact and normal. Vascular: Major intracranial vascular flow voids are well maintained and normal in appearance. Skull and upper cervical spine: Craniocervical junction normal. Bone marrow signal intensity within normal limits. No focal marrow replacing lesion. No scalp soft tissue abnormality. Sinuses/Orbits: Globes and orbital soft tissues within normal limits. Mild scattered mucosal thickening noted within the ethmoidal air cells. Paranasal sinuses are otherwise largely clear. No significant mastoid effusion. Inner ear structures within normal limits. Other: None. IMPRESSION: Normal brain MRI for age. No acute intracranial abnormality identified. Electronically Signed   By: Rise Mu M.D.   On: 09/13/2020 22:30    Microbiology: Recent Results (from the past 240 hour(s))  Resp Panel by RT-PCR (Flu A&B, Covid) Nasopharyngeal Swab     Status: None   Collection Time: 09/12/20  2:24 PM   Specimen: Nasopharyngeal Swab; Nasopharyngeal(NP) swabs in vial transport medium  Result Value Ref  Range Status   SARS Coronavirus 2 by RT PCR NEGATIVE NEGATIVE Final    Comment: (NOTE) SARS-CoV-2 target nucleic acids are NOT DETECTED.  The SARS-CoV-2 RNA is generally detectable in upper respiratory specimens during the acute phase of infection. The lowest concentration of SARS-CoV-2 viral copies this assay can detect is 138 copies/mL. A negative result does not preclude SARS-Cov-2 infection and should not be used as the sole basis for treatment or other patient management decisions. A negative result may occur with  improper specimen collection/handling, submission of specimen other than nasopharyngeal swab, presence of viral mutation(s) within the areas targeted by this assay, and inadequate number of viral copies(<138 copies/mL). A negative result must be combined with clinical  observations, patient history, and epidemiological information. The expected result is Negative.  Fact Sheet for Patients:  BloggerCourse.com  Fact Sheet for Healthcare Providers:  SeriousBroker.it  This test is no t yet approved or cleared by the [place] FDA and  has been authorized for detection and/or diagnosis of SARS-CoV-2 by FDA under an Emergency Use Authorization (EUA). This EUA will remain  in effect (meaning this test can be used) for the duration of the COVID-19 declaration under Section 564(b)(1) of the Act, 21 U.S.C.section 360bbb-3(b)(1), unless the authorization is terminated  or revoked sooner.       Influenza A by PCR NEGATIVE NEGATIVE Final   Influenza B by PCR NEGATIVE NEGATIVE Final    Comment: (NOTE) The Xpert Xpress SARS-CoV-2/FLU/RSV plus assay is intended as an aid in the diagnosis of influenza from Nasopharyngeal swab specimens and should not be used as a sole basis for treatment. Nasal washings and aspirates are unacceptable for Xpert Xpress SARS-CoV-2/FLU/RSV testing.  Fact Sheet for Patients: BloggerCourse.com  Fact Sheet for Healthcare Providers: SeriousBroker.it  This test is not yet approved or cleared by the [place] FDA and has been authorized for detection and/or diagnosis of SARS-CoV-2 by FDA under an Emergency Use Authorization (EUA). This EUA will remain in effect (meaning this test can be used) for the duration of the COVID-19 declaration under Section 564(b)(1) of the Act, 21 U.S.C. section 360bbb-3(b)(1), unless the authorization is terminated or revoked.  Performed at [facility], [address] [place], [place] [address]      Labs: Basic Metabolic Panel: Recent Labs  Lab 09/13/20 1800 09/14/20 0509 09/15/20 0647  NA 138 139 137  K 3.8 3.1* 4.0  CL 103 104 104  CO2 16* 24 26  GLUCOSE 150* 106* 91   BUN 9 7 6   CREATININE 1.22 0.73 0.73  CALCIUM 9.8 9.0 9.2  MG 2.1  --   --    Liver Function Tests: Recent Labs  Lab 09/13/20 1800  AST 59*  ALT 31  ALKPHOS 74  BILITOT 0.7  PROT 8.8*  ALBUMIN 5.5*   No results for input(s): LIPASE, AMYLASE in the last 168 hours. No results for input(s): AMMONIA in the last 168 hours. CBC: Recent Labs  Lab 09/13/20 1800 09/14/20 0509 09/15/20 0647  WBC 16.8* 10.6* 7.6  NEUTROABS 12.6*  --   --   HGB 17.2* 15.5 15.2  HCT 51.7 44.9 45.0  MCV 87.8 86.2 87.9  PLT 428* 311 266   Cardiac Enzymes: Recent Labs  Lab 09/13/20 1800 09/14/20 0509 09/15/20 0647 09/16/20 1304 09/18/20 0549  CKTOTAL 1,259* 873* 583* 634* 212   BNP: BNP (last 3 results) No results for input(s): BNP in the last 8760 hours.  ProBNP (last 3 results) No results  for input(s): PROBNP in the last 8760 hours.  CBG: No results for input(s): GLUCAP in the last 168 hours.     Signed:  Darlin Drop, MD [facility] 09/18/2020, 12:02 PM

## 2020-09-18 NOTE — Progress Notes (Signed)
Patient admitted from [facility] - Medical floor. Report received by RN on the floor. Patient calm and compliant during assessment. Patient denies SI/HI/AVH. Patient stated he has been down here before and is familiar with the unit. Patient oriented to the unit and his room. Patient given education, support, and encouragement to be active in his treatment plan. Patient being monitored Q 15 minutes for safety per unit protocol. Pt remains safe on the unit.

## 2020-09-18 NOTE — BH Assessment (Signed)
Patient can come down after 9pm  Call to give report: [phone]  Patient is to be admitted to [facility] byDr. Neale Burly  Attending Physician will be.Dr. Neale Burly  Patient has been assigned to room(TBA), [facility] Charge NurseGigi, RN.  Intake Paper Work has been signed and placed on patient chart.  MFL staff is aware of the admission: 1. Cristal Deer B,Patient's Nurse  2. Ethelene Browns, Patient Access.

## 2020-09-19 DIAGNOSIS — F22 Delusional disorders: Principal | ICD-10-CM

## 2020-09-19 MED ORDER — GABAPENTIN 300 MG PO CAPS
300.0000 mg | ORAL_CAPSULE | Freq: Three times a day (TID) | ORAL | Status: DC
  Administered 2020-09-19 – 2020-09-20 (×4): 300 mg via ORAL
  Filled 2020-09-19 (×4): qty 1

## 2020-09-19 MED ORDER — LORAZEPAM 2 MG PO TABS
2.0000 mg | ORAL_TABLET | ORAL | Status: DC | PRN
  Administered 2020-09-19 – 2020-09-21 (×3): 2 mg via ORAL
  Filled 2020-09-19 (×3): qty 1

## 2020-09-19 MED ORDER — LORAZEPAM 2 MG/ML IJ SOLN: 2.0000 mg | INTRAMUSCULAR | Status: DC | PRN

## 2020-09-19 MED ORDER — HYDRALAZINE HCL 10 MG PO TABS
10.0000 mg | ORAL_TABLET | Freq: Four times a day (QID) | ORAL | Status: DC | PRN
Start: 2020-09-19 — End: 2020-09-22
  Filled 2020-09-19: qty 1

## 2020-09-19 NOTE — Progress Notes (Signed)
Patient denies SI, HI, and AVH. He complains of poor sleep due to "the burning from the radiation". Patient states "the radiation is burning through my internal organs." Patient became agitated and anxious, screaming that nobody is helping him with the radiation and that "I am having a radiation emergency." Patient was given 2mg  Ativan and support and encouragement provided. Patient is compliant with medications. Patient remains safe on the unit at this time and q15 min safety checks are maintained.

## 2020-09-19 NOTE — BHH Counselor (Signed)
Adult Comprehensive Assessment  Patient ID: Connor [name], male   DOB: 02-01-2001, 20 y.o.   MRN: [id number]  Information Source: Information source: Patient  Current Stressors:  Patient states their primary concerns and needs for treatment are:: "me and my mom haven't been getting along.  I've been trying to get some medical help because I have been exposed to radiation" Patient states their goals for this hospitilization and ongoing recovery are:: "I'd like to try somehow to get the radiation out of me" Educational / Learning stressors: Pt denies. Employment / Job issues: Pt denies. Family Relationships: "me and my mom are not getting alongEngineer, petroleum / Lack of resources (include bankruptcy): Pt denies. Housing / Lack of housing: Pt denies. Physical health (include injuries & life threatening diseases): Pt denies. Social relationships: Pt denies. Substance abuse: Pt denies. Bereavement / Loss: Pt denies.  Living/Environment/Situation:  Living Arrangements: Parent Who else lives in the home?: Mom How long has patient lived in current situation?: "a few years" What is atmosphere in current home: Chaotic  Family History:  Marital status: Single Does patient have children?: No  Childhood History:  By whom was/is the patient raised?: Both parents Description of patient's relationship with caregiver when they were a child: "okay" Patient's description of current relationship with people who raised him/her: "some conflict with my mom, haven't spoken to my dad in awhile" How were you disciplined when you got in trouble as a child/adolescent?: "didn't get in trouble a whole lot" Does patient have siblings?: Yes Number of Siblings: 1 Description of patient's current relationship with siblings: "pretty good" Did patient suffer any verbal/emotional/physical/sexual abuse as a child?: No Did patient suffer from severe childhood neglect?: No Has patient ever been sexually  abused/assaulted/raped as an adolescent or adult?: No Was the patient ever a victim of a crime or a disaster?: No Witnessed domestic violence?: No Has patient been affected by domestic violence as an adult?: No  Education:  Highest grade of school patient has completed: 11th Currently a student?: No Learning disability?: Yes What learning problems does patient have?: "Autism"  Employment/Work Situation:   Employment situation: On disability Why is patient on disability: "mental health" How long has patient been on disability: "couple of months" Has patient ever been in the [employer]?: No  Financial Resources:   Surveyor, quantity resources: Restaurant manager, fast food from parents / caregiver,Medicaid,Medicare Does patient have a Lawyer or guardian?: No  Alcohol/Substance Abuse:   What has been your use of drugs/alcohol within the last 12 months?: Pt denies. If attempted suicide, did drugs/alcohol play a role in this?: No Alcohol/Substance Abuse Treatment Hx: Denies past history Has alcohol/substance abuse ever caused legal problems?: No  Social Support System:   Patient's Community Support System: None Describe Community Support System: Pt denies. Type of faith/religion: Connor [name] How does patient's faith help to cope with current illness?: Pt denies.  Leisure/Recreation:   Do You Have Hobbies?: No  Strengths/Needs:   What is the patient's perception of their strengths?: "I'm a pretty nice guy and I am pretty enough" Patient states they can use these personal strengths during their treatment to contribute to their recovery: Pt denies. Patient states these barriers may affect/interfere with their treatment: "not getting treatment for my radiation" Patient states these barriers may affect their return to the community: Pt denies.  Discharge Plan:   Currently receiving community mental health services: Yes (From Whom) [facility]) Patient states concerns and  preferences for aftercare planning are: Pt reports that  he would like to continue with current provider but is also open to therapy. Patient states they will know when they are safe and ready for discharge when: "when I feel like everything goes back to normal" Does patient have access to transportation?: Yes Does patient have financial barriers related to discharge medications?: No Will patient be returning to same living situation after discharge?: Yes  Summary/Recommendations:   Summary and Recommendations (to be completed by the evaluator): Patient is a 20 year old male from [place] [place] [facility]   He presents to the hospital under IVC.  He reports a belief that he has radiation poisoning and has not been on his medications per his mother who brought him to the hospital.  Recommendations include: crisis stabilization, therapeutic milieu, encourage group attendance and participation, medication management for detox/mood stabilization and development of comprehensive mental wellness/sobriety plan.  Harden Mo. 09/19/2020

## 2020-09-19 NOTE — BHH Group Notes (Addendum)
LCSW Group Therapy Note       09/19/2020 2:22 PM      Type of Therapy/Topic:  Group Therapy: Emotional Regulation        Participation Level:  Active     Description of Group:     Milieu was in state of chaos. Group was held outside to utilize coping skills to help deal with strong emotions. Positive recreational activity and mindfulness were practiced to assist with distraction/amelioration of negative emotional outbursts and overload.        Therapeutic Goals:   1. Patient will identify positive activities to help distract/ameliorate negative emotions.   2. Patient will demonstrate positive leisure activity/mindfulness practice.       Summary of Patient Progress:   Patient left the group early but participated by talking with other patients and CSW while he was in attendance.      Therapeutic Modalities:   Mindfulness   Positive leisure      Keagen Heinlen [place], MSW, LCSW-A 3/29/20222:22 PM

## 2020-09-19 NOTE — [facility] Suicide Risk Assessment (Signed)
[facility] INPATIENT:  Family/Significant Other Suicide Prevention Education  Suicide Prevention Education:  Education Completed; Mykah Shin, mother, [phone] has been identified by the patient as the family member/significant other with whom the patient will be residing, and identified as the person(s) who will aid the patient in the event of a mental health crisis (suicidal ideations/suicide attempt).  With written consent from the patient, the family member/significant other has been provided the following suicide prevention education, prior to the and/or following the discharge of the patient.  The suicide prevention education provided includes the following:  Suicide risk factors  Suicide prevention and interventions  National Suicide Hotline telephone number  [facility] assessment telephone number  [facility] Emergency Assistance 911  [facility] and/or Residential Mobile Crisis Unit telephone number  Request made of family/significant other to:  Remove weapons (e.g., guns, rifles, knives), all items previously/currently identified as safety concern.    Remove drugs/medications (over-the-counter, prescriptions, illicit drugs), all items previously/currently identified as a safety concern.  The family member/significant other verbalizes understanding of the suicide prevention education information provided.  The family member/significant other agrees to remove the items of safety concern listed above.  Mother reports that pt has been "for the past 2 1/2 months since a CT scan and a couple of Xrays he believes that he has radiation".  Mother reports "he has been to the ER about 30 times.  Mother reports "to ease his mind I took him to [place], [place], [place]" mother reports that she took him to these hospitals multiple times.  Mother reports "he would say something like I should just stab this knife into my throat or I just wish that you would  do it".  She reports that she removed the sharps from the homes.  Mother reports "he really believes this".  Mother reports "they would just let him go from the emergency room and most said delusional and others said psychosis". Mother reports "he has unplugged everything in the house, wouldn't let me lift the shades, in the cold wouldn't let me turn the heat on".  Mother reports that the patient got his information from a YouTube video.  Mother reports that patient had stopped taking medication, stating that "it wont absorb with the radiation". Mother reports that patient has other MH diagnosis including severe PTSD, episodic mood disorder and I think he may have Bipolar Disorder. Mother reports that patient "has been verbally abusing me and has scared me but I don't think he would kill".  Mother reports that pt has stated to her "I wish I could kill you right now".  She reports that patient has a history of being jailed for his aggression towards her and pt is currently on probation/parole.  Mother does report that patient has the capability of hurting her.  Mother reports that there are no weapons in the home.    Harden Mo 09/19/2020, 3:28 PM

## 2020-09-19 NOTE — [facility] Suicide Risk Assessment (Signed)
[facility] Admission Suicide Risk Assessment   Nursing information obtained from:  Patient Demographic factors:  [name],Adolescent or young adult,Caucasian Current Mental Status:  NA Loss Factors:  NA Historical Factors:  Prior suicide attempts Risk Reduction Factors:  Sense of responsibility to family,Living with another person, especially a relative,Positive social support  Total Time spent with patient: 1 hour Principal Problem: Delusional disorder (HCC) Diagnosis:  Principal Problem:   Delusional disorder (HCC) Active Problems:   Autism spectrum disorder associated with known medical or genetic condition or environmental factor, requiring substantial support (level 2)   Major depressive disorder, recurrent episode (HCC)   Panic disorder  Subjective Data: 20 year old [name] with intense delusion that he is suffering from acute radiation poisoning despite evidence of the contrary. Has expressed suicidal ideations at home secondary to this belief. See H&P for full details.   Continued Clinical Symptoms:  Alcohol Use Disorder Identification Test Final Score (AUDIT): 0 The "Alcohol Use Disorders Identification Test", Guidelines for Use in Primary Care, Second Edition.  World Science writer [facility]). Score between 0-7:  no or low risk or alcohol related problems. Score between 8-15:  moderate risk of alcohol related problems. Score between 16-19:  high risk of alcohol related problems. Score 20 or above:  warrants further diagnostic evaluation for alcohol dependence and treatment.   CLINICAL FACTORS:   Severe Anxiety and/or Agitation Depression:   Delusional Hopelessness Insomnia Severe Unstable or Poor Therapeutic Relationship Previous Psychiatric Diagnoses and Treatments Medical Diagnoses and Treatments/Surgeries   Musculoskeletal: Strength & Muscle Tone: within normal limits Gait & Station: normal Patient leans: N/A  Psychiatric Specialty Exam:  Presentation  General  Appearance: Disheveled  Eye Contact:Minimal  Speech:Slow  Speech Volume:Normal  Handedness:Right   Mood and Affect  Mood:Anxious  Affect:Congruent; Constricted   Thought Process  Thought Processes:Goal Directed  Descriptions of Associations:Intact  Orientation:Full (Time, Place and Person)  Thought Content:Rumination; Perseveration; Delusions; Obsessions  History of Schizophrenia/Schizoaffective disorder:No  Duration of Psychotic Symptoms:Less than six months  Hallucinations:Hallucinations: Tactile  Ideas of Reference:Delusions  Suicidal Thoughts:Suicidal Thoughts: No  Homicidal Thoughts:Homicidal Thoughts: No   Sensorium  Memory:Immediate Fair; Recent Fair; Remote Fair  Judgment:Impaired  Insight:Lacking   Executive Functions  Concentration:Fair  Attention Span:Fair  Recall:Fair  Fund of Knowledge:Fair  Language:Fair   Psychomotor Activity  Psychomotor Activity:Psychomotor Activity: Decreased   Assets  Assets:Desire for Improvement; Financial Resources/Insurance; Housing; Resilience; Social Support   Sleep  Sleep:Sleep: Fair Number of Hours of Sleep: 7.45    Physical Exam: Physical Exam Vitals and nursing note reviewed.  Constitutional:      Appearance: Normal appearance.  HENT:     Head: Normocephalic and atraumatic.     Right Ear: External ear normal.     Left Ear: External ear normal.     Nose: Nose normal.     Mouth/Throat:     Mouth: Mucous membranes are moist.     Pharynx: Oropharynx is clear.  Eyes:     Extraocular Movements: Extraocular movements intact.     Conjunctiva/sclera: Conjunctivae normal.     Pupils: Pupils are equal, round, and reactive to light.  Cardiovascular:     Rate and Rhythm: Normal rate.     Pulses: Normal pulses.  Pulmonary:     Effort: Pulmonary effort is normal.     Breath sounds: Normal breath sounds.  Abdominal:     General: Abdomen is flat.     Palpations: Abdomen is soft.   Musculoskeletal:        General: No  swelling. Normal range of motion.     Cervical back: Normal range of motion and neck supple.  Skin:    General: Skin is warm and dry.  Neurological:     General: No focal deficit present.     Mental Status: He is alert and oriented to person, place, and time.  Psychiatric:        Attention and Perception: Perception normal.        Mood and Affect: Mood is anxious.        Speech: Speech normal.        Behavior: Behavior is agitated.        Thought Content: Thought content is paranoid and delusional.        Cognition and Memory: Memory normal. Cognition is impaired.        Judgment: Judgment is inappropriate.    Review of Systems  Constitutional: Positive for malaise/fatigue. Negative for fever.  HENT: Negative for sinus pain and sore throat.   Eyes: Positive for photophobia. Negative for blurred vision.  Respiratory: Negative for cough and shortness of breath.   Cardiovascular: Negative for chest pain and palpitations.  Gastrointestinal: Positive for diarrhea. Negative for nausea and vomiting.  Genitourinary: Negative for dysuria and urgency.  Musculoskeletal: Positive for back pain, joint pain and myalgias.  Skin: Negative for itching.       Patient reports that his skin is burning and looks odd, however normal appearance on physical exam  Neurological: Positive for tingling, sensory change and headaches.  Endo/Heme/Allergies: Positive for environmental allergies. Negative for polydipsia.  Psychiatric/Behavioral: The patient is nervous/anxious.    Blood pressure (!) 135/104, pulse (!) 107, temperature 98.8 F (37.1 C), temperature source Oral, resp. rate 17, height 6\' 4"  (1.Connor m), weight 120.8 kg, SpO2 97 %. Body mass index is 32.42 kg/m.   COGNITIVE FEATURES THAT CONTRIBUTE TO RISK:  Loss of executive function and Polarized thinking    SUICIDE RISK:   Moderate:  Frequent suicidal ideation with limited intensity, and duration, some  specificity in terms of plans, no associated intent, good self-control, limited dysphoria/symptomatology, some risk factors present, and identifiable protective factors, including available and accessible social support.  PLAN OF CARE: Continue inpatient admission, See H&P for full details.   I certify that inpatient services furnished can reasonably be expected to improve the patient's condition.   , MD 09/19/2020, 1:17 PM

## 2020-09-19 NOTE — H&P (Signed)
Psychiatric Admission Assessment Adult  Patient Identification: Connor [name] MRN:  [id number] Date of Evaluation:  09/19/2020 Chief Complaint:  Delusional disorder [facility]) [F22] Principal Diagnosis: Delusional disorder (HCC) Diagnosis:  Principal Problem:   Delusional disorder (HCC) Active Problems:   Autism spectrum disorder associated with known medical or genetic condition or environmental factor, requiring substantial support (level 2)   Major depressive disorder, recurrent episode (HCC)   Panic disorder  CC "Everything is burning."  History of Present Illness: 20 year old male presenting with a fixed delusion that he is suffering from acute radiation poisoning. This has been ongoing for the last several months since he had a CT scan. He feels that his body is burning all over, that everything is extraordinarily bright, and that all his cell lines are dropping to the point he feels he needs a bone marrow transplant. These beliefs have caused him to come to the emergency department 26 times since December 2021. They have also caused him to become distressed enough to tell his mother he wanted to kill himself, and kill her for not helping him.  His medical workup has been negative, but delusion remains fixed. While in the emergency room he was tried on some antipsychotics to help with possible underlying psychosis. However, he recently had a severe dystonic reaction leading to medical admission. Antipsychotics have been discontinued. He remains on carbamazepine, pristiq, and klonopin for mood, anxiety, and possible underlying OCD. Today he continues to express the delusions mentioned above. However, he denies suicidal ideations, homicidal ideations, visual hallucinations, and auditory hallucinations. Today we walked through his lab results one by one so he could see the normal cell line counts himself. We also pulled up the MRI report as he had complaints of brain swelling. We also  discussed how anxiety can manifest in several intense bodily feelings of pain, shortness of breath, diarrhea, etc. Patient continues to express delusions above, but he was willing to start Gabapentin 300 mg TID for neuropathic pain and anxiety.   Associated Signs/Symptoms: Depression Symptoms:  depressed mood, insomnia, hopelessness, recurrent thoughts of death, anxiety, panic attacks, disturbed sleep, weight loss, decreased appetite, Duration of Depression Symptoms: Greater than two weeks  (Hypo) Manic Symptoms:  Delusions, Hallucinations, Anxiety Symptoms:  Excessive Worry, Panic Symptoms, Obsessive Compulsive Symptoms:   obsession about radiation poisoning, Psychotic Symptoms:  Delusions, Paranoia, PTSD Symptoms: Negative Total Time spent with patient: 1 hour  Past Psychiatric History: Jostpru pf ASD, ADHD, and unspecified mood disorder. Recent onset of delusion of acute radiation poisoning. Differential includes OCD, illness anxiety disorder, unspecified psychosis. No prior suicide attempts. Two prior admissions to our unit in 2019  Is the patient at risk to self? Yes.    Has the patient been a risk to self in the past 6 months? Yes.    Has the patient been a risk to self within the distant past? No.  Is the patient a risk to others? Yes.    Has the patient been a risk to others in the past 6 months? Yes.    Has the patient been a risk to others within the distant past? Yes.     Prior Inpatient Therapy:   Prior Outpatient Therapy:    Alcohol Screening: 1. How often do you have a drink containing alcohol?: Never 2. How many drinks containing alcohol do you have on a typical day when you are drinking?: 1 or 2 3. How often do you have six or more drinks on one occasion?: Never AUDIT-C Score:  0 4. How often during the last year have you found that you were not able to stop drinking once you had started?: Never 5. How often during the last year have you failed to do what was  normally expected from you because of drinking?: Never 6. How often during the last year have you needed a first drink in the morning to get yourself going after a heavy drinking session?: Never 7. How often during the last year have you had a feeling of guilt of remorse after drinking?: Never 8. How often during the last year have you been unable to remember what happened the night before because you had been drinking?: Never 9. Have you or someone else been injured as a result of your drinking?: No 10. Has a relative or friend or a doctor or another health worker been concerned about your drinking or suggested you cut down?: No Alcohol Use Disorder Identification Test Final Score (AUDIT): 0 Alcohol Brief Interventions/Follow-up: AUDIT Score <7 follow-up not indicated Substance Abuse History in the last 12 months:  No. Consequences of Substance Abuse: Negative Previous Psychotropic Medications: Yes  Psychological Evaluations: No  Past Medical History:  Past Medical History:  Diagnosis Date  . ADHD (attention deficit hyperactivity disorder)   . Autism   . Depressed   . Neurofibromatosis (HCC)    Type 1  . Obesity   . Pertussis    as a infant    Past Surgical History:  Procedure Laterality Date  . MRI    . RADIOLOGY WITH ANESTHESIA N/A 08/13/2012   Procedure: RADIOLOGY WITH ANESTHESIA;  Surgeon: Medication Radiologist, MD;  Location: [facility] OR;  Service: Radiology;  Laterality: N/A;  MRI    Family History:  Family History  Problem Relation Age of Onset  . Anxiety disorder Mother   . Depression Mother   . OCD Mother   . Hypertension Mother   . Cancer Maternal Aunt   . Cancer Maternal Grandmother   . Hypertension Father   . Schizophrenia Maternal Uncle    Family Psychiatric  History: schizophrenia in maternal uncle, mother with OCD, depression and anxiety Tobacco Screening: Have you used any form of tobacco in the last 30 days? (Cigarettes, Smokeless Tobacco, Cigars, and/or  Pipes): No Social History:  Social History   Substance and Sexual Activity  Alcohol Use No  . Alcohol/week: 0.0 standard drinks     Social History   Substance and Sexual Activity  Drug Use No    Additional Social History: Marital status: Single Does patient have children?: No                         Allergies:   Allergies  Allergen Reactions  . Haldol [Haloperidol] Other (See Comments)    Dystonia     Lab Results:  Results for orders placed or performed during the hospital encounter of 09/12/20 (from the past 48 hour(s))  CK     Status: None   Collection Time: 09/18/20  5:49 AM  Result Value Ref Range   Total CK 212 49 - 397 U/L    Comment: Performed at [facility], [address], [place], [place] [address]    Blood Alcohol level:  Lab Results  Component Value Date   [facility] <10 09/05/2020   ETH <10 09/01/2020    Metabolic Disorder Labs:  Lab Results  Component Value Date   HGBA1C 4.9 07/09/2017   MPG 93.93 07/09/2017   Lab Results  Component Value Date   PROLACTIN 3.4 (L) 08/08/2016   Lab Results  Component Value Date   CHOL 164 07/09/2017   TRIG 171 (H) 07/09/2017   HDL 35 (L) 07/09/2017   CHOLHDL 4.7 07/09/2017   VLDL 34 07/09/2017   LDLCALC 95 07/09/2017   LDLCALC 105 08/08/2016    Current Medications: Current Facility-Administered Medications  Medication Dose Route Frequency Provider Last Rate Last Admin  . acetaminophen (TYLENOL) tablet 650 mg  650 mg Oral Q6H PRN Clapacs, John T, MD      . alum & mag hydroxide-simeth (MAALOX/MYLANTA) 200-200-20 MG/5ML suspension 30 mL  30 mL Oral Q4H PRN Clapacs, John T, MD      . carbamazepine (TEGRETOL) tablet 200 mg  200 mg Oral BID Clapacs, Jackquline Denmark, MD   200 mg at 09/19/20 0801  . clonazePAM (KLONOPIN) tablet 0.5 mg  0.5 mg Oral BID Clapacs, Jackquline Denmark, MD   0.5 mg at 09/19/20 0802  . desvenlafaxine (PRISTIQ) 24 hr tablet 100 mg  100 mg Oral Daily Clapacs, Jackquline Denmark, MD   100 mg at 09/19/20  0802  . diphenhydrAMINE (BENADRYL) injection 50 mg  50 mg Intramuscular Q2H PRN Clapacs, John T, MD      . gabapentin (NEURONTIN) capsule 300 mg  300 mg Oral TID Jesse Sans, MD   300 mg at 09/19/20 1313  . hydrALAZINE (APRESOLINE) tablet 10 mg  10 mg Oral Q6H PRN Jesse Sans, MD      . ibuprofen (ADVIL) tablet 600 mg  600 mg Oral Q6H PRN Clapacs, Jackquline Denmark, MD      . LORazepam (ATIVAN) tablet 2 mg  2 mg Oral Q4H PRN Jesse Sans, MD   2 mg at 09/19/20 1111   Or  . LORazepam (ATIVAN) injection 2 mg  2 mg Intramuscular Q4H PRN Jesse Sans, MD      . magnesium hydroxide (MILK OF MAGNESIA) suspension 30 mL  30 mL Oral Daily PRN Clapacs, Jackquline Denmark, MD      . multivitamin with minerals tablet 1 tablet  1 tablet Oral Daily Clapacs, Jackquline Denmark, MD   1 tablet at 09/19/20 0801  . pantoprazole (PROTONIX) EC tablet 40 mg  40 mg Oral Daily Clapacs, Jackquline Denmark, MD   40 mg at 09/19/20 0802  . polyethylene glycol (MIRALAX / GLYCOLAX) packet 17 g  17 g Oral Daily Clapacs, John T, MD   17 g at 09/19/20 0802   PTA Medications: Medications Prior to Admission  Medication Sig Dispense Refill Last Dose  . carbamazepine (TEGRETOL) 200 MG tablet Take 400 mg by mouth at bedtime.     . carbamazepine (TEGRETOL) 200 MG tablet Take 200 mg by mouth daily.     . clonazePAM (KLONOPIN) 0.5 MG tablet Take 1 tablet (0.5 mg total) by mouth 2 (two) times daily for 10 days. 20 tablet 0   . desvenlafaxine (PRISTIQ) 100 MG 24 hr tablet Take 100 mg by mouth daily.     Marland Kitchen econazole nitrate 1 % cream Apply topically 2 (two) times daily for 14 days. Apply to red rash in groin 30 g 1   . Multiple Vitamins-Minerals (MULTIVITAMIN GUMMIES MENS) CHEW Chew 1 tablet by mouth daily.     . pantoprazole (PROTONIX) 40 MG tablet Take 1 tablet (40 mg total) by mouth daily. 30 tablet 0   . polyethylene glycol powder (GLYCOLAX/MIRALAX) 17 GM/SCOOP powder Take 255 g by mouth daily. (Patient taking differently: Take 1 Container by  mouth daily as  needed for severe constipation.) 255 g 3   . sucralfate (CARAFATE) 1 GM/10ML suspension Take 10 mLs by mouth 4 (four) times daily as needed (stomach).     . triamcinolone (KENALOG) 0.1 % Apply 1 application topically 3 (three) times daily as needed (skin irritation or burns).       Musculoskeletal: Strength & Muscle Tone: within normal limits Gait & Station: normal Patient leans: N/A            Psychiatric Specialty Exam:  Presentation  General Appearance: Disheveled  Eye Contact:Minimal  Speech:Slow  Speech Volume:Normal  Handedness:Right   Mood and Affect  Mood:Anxious  Affect:Congruent; Constricted   Thought Process  Thought Processes:Goal Directed  Duration of Psychotic Symptoms: Less than six months  Past Diagnosis of Schizophrenia or Psychoactive disorder: No  Descriptions of Associations:Intact  Orientation:Full (Time, Place and Person)  Thought Content:Rumination; Perseveration; Delusions; Obsessions  Hallucinations:Hallucinations: Tactile  Ideas of Reference:Delusions  Suicidal Thoughts:Suicidal Thoughts: No  Homicidal Thoughts:Homicidal Thoughts: No   Sensorium  Memory:Immediate Fair; Recent Fair; Remote Fair  Judgment:Impaired  Insight:Lacking   Executive Functions  Concentration:Fair  Attention Span:Fair  Recall:Fair  Fund of Knowledge:Fair  Language:Fair   Psychomotor Activity  Psychomotor Activity:Psychomotor Activity: Decreased   Assets  Assets:Desire for Improvement; Financial Resources/Insurance; Housing; Resilience; Social Support   Sleep  Sleep:Sleep: Fair Number of Hours of Sleep: 7.45    Physical Exam: Physical Exam Vitals and nursing note reviewed.  Constitutional:      Appearance: Normal appearance.  HENT:     Head: Normocephalic and atraumatic.     Right Ear: External ear normal.     Left Ear: External ear normal.     Nose: Nose normal.     Mouth/Throat:     Mouth: Mucous membranes are  moist.     Pharynx: Oropharynx is clear.  Eyes:     Extraocular Movements: Extraocular movements intact.     Conjunctiva/sclera: Conjunctivae normal.     Pupils: Pupils are equal, round, and reactive to light.  Cardiovascular:     Rate and Rhythm: Normal rate.     Pulses: Normal pulses.  Pulmonary:     Effort: Pulmonary effort is normal.     Breath sounds: Normal breath sounds.  Abdominal:     General: Abdomen is flat.     Palpations: Abdomen is soft.  Musculoskeletal:        General: No swelling. Normal range of motion.     Cervical back: Normal range of motion and neck supple.  Skin:    General: Skin is warm and dry.  Neurological:     General: No focal deficit present.     Mental Status: He is alert and oriented to person, place, and time.  Psychiatric:        Attention and Perception: Perception normal.        Mood and Affect: Mood is anxious.        Speech: Speech normal.        Behavior: Behavior is agitated.        Thought Content: Thought content is paranoid and delusional.        Cognition and Memory: Memory normal. Cognition is impaired.        Judgment: Judgment is inappropriate.    Review of Systems  Constitutional: Positive for malaise/fatigue. Negative for fever.  HENT: Negative for sinus pain and sore throat.   Eyes: Positive for photophobia. Negative for blurred vision.  Respiratory: Negative for cough  and shortness of breath.   Cardiovascular: Negative for chest pain and palpitations.  Gastrointestinal: Positive for diarrhea. Negative for nausea and vomiting.  Genitourinary: Negative for dysuria and urgency.  Musculoskeletal: Positive for back pain, joint pain and myalgias.  Skin: Negative for itching.       Patient reports that his skin is burning and looks odd, however normal appearance on physical exam  Neurological: Positive for tingling, sensory change and headaches.  Endo/Heme/Allergies: Positive for environmental allergies. Negative for  polydipsia.  Psychiatric/Behavioral: The patient is nervous/anxious.    Blood pressure (!) 135/104, pulse (!) 107, temperature 98.8 F (37.1 C), temperature source Oral, resp. rate 17, height  (1.93 m), weight 120.8 kg, SpO2 97 %. Body mass index is 32.42 kg/m.  Treatment Plan Summary: Daily contact with patient to assess and evaluate symptoms and progress in treatment and Medication management  1) Delusional Disorder vs illness anxiety disorder vs OCD vs underlying unspecified psychosis- unstable - Tegretol 200 mg BID, Pristiq 100 mg daily, Klonopin 0.5 mg BID. Start gabapentin 300 mg TID for reported neuropathic pain and off label for anxiety - Recently hospitalized for acute dystonic reaction, avoiding neuroleptics for now, PRN ativan in place for agitation   2) Elevated blood pressure reading- new problem - Continue to monitor, hydralazine 10 mg Qhr PRN for elevated readings, consider adding amlodpine if continues to be elevated   Observation Level/Precautions:  15 minute checks  Laboratory:  Completed in ED  Psychotherapy:    Medications:    Consultations:    Discharge Concerns:    Estimated LOS:  Other:     Physician Treatment Plan for Primary Diagnosis: Delusional disorder (HCC) Long Term Goal(s): Improvement in symptoms so as ready for discharge  Short Term Goals: Ability to identify changes in lifestyle to reduce recurrence of condition will improve, Ability to verbalize feelings will improve, Ability to disclose and discuss suicidal ideas, Ability to demonstrate self-control will improve, Ability to identify and develop effective coping behaviors will improve, Ability to maintain clinical measurements within normal limits will improve and Compliance with prescribed medications will improve  Physician Treatment Plan for Secondary Diagnosis: Principal Problem:   Delusional disorder (HCC) Active Problems:   Autism spectrum disorder associated with known medical or  genetic condition or environmental factor, requiring substantial support (level 2)   Major depressive disorder, recurrent episode (HCC)   Panic disorder  Long Term Goal(s): Improvement in symptoms so as ready for discharge  Short Term Goals: Ability to identify changes in lifestyle to reduce recurrence of condition will improve, Ability to verbalize feelings will improve, Ability to disclose and discuss suicidal ideas, Ability to demonstrate self-control will improve, Ability to identify and develop effective coping behaviors will improve, Ability to maintain clinical measurements within normal limits will improve and Compliance with prescribed medications will improve  I certify that inpatient services furnished can reasonably be expected to improve the patient's condition.    Jesse Sans, MD 3/29/20221:20 PM

## 2020-09-19 NOTE — Progress Notes (Signed)
Recreation Therapy Notes   Date: 09/19/2020  Time: 2:30 pm   Location: Craft room     Behavioral response: N/A   Intervention Topic: Self-care    Discussion/Intervention: Patient did not attend group.   Clinical Observations/Feedback:  Patient did not attend group.   Neddie Steedman LRT/CTRS         Jinx Gilden 09/19/2020 4:09 PM

## 2020-09-20 MED ORDER — AMLODIPINE BESYLATE 5 MG PO TABS
5.0000 mg | ORAL_TABLET | Freq: Every day | ORAL | Status: DC
  Administered 2020-09-20 – 2020-09-22 (×3): 5 mg via ORAL
  Filled 2020-09-20 (×3): qty 1

## 2020-09-20 MED ORDER — CARBAMAZEPINE 200 MG PO TABS
200.0000 mg | ORAL_TABLET | Freq: Every day | ORAL | Status: DC
  Administered 2020-09-21 – 2020-09-22 (×2): 200 mg via ORAL
  Filled 2020-09-20 (×2): qty 1

## 2020-09-20 MED ORDER — ZIPRASIDONE HCL 40 MG PO CAPS
40.0000 mg | ORAL_CAPSULE | Freq: Every day | ORAL | Status: DC
  Administered 2020-09-20 – 2020-09-21 (×2): 40 mg via ORAL
  Filled 2020-09-20 (×2): qty 1

## 2020-09-20 MED ORDER — CARBAMAZEPINE 200 MG PO TABS
400.0000 mg | ORAL_TABLET | Freq: Every day | ORAL | Status: DC
  Administered 2020-09-20 – 2020-09-21 (×2): 400 mg via ORAL
  Filled 2020-09-20 (×2): qty 2

## 2020-09-20 MED ORDER — GABAPENTIN 300 MG PO CAPS
600.0000 mg | ORAL_CAPSULE | Freq: Three times a day (TID) | ORAL | Status: DC
  Administered 2020-09-20 – 2020-09-21 (×2): 600 mg via ORAL
  Filled 2020-09-20 (×2): qty 2

## 2020-09-20 NOTE — [facility] Counselor (Signed)
CSW spoke with pt's mother, Connor [name]. [name] stated that she does not feel that pt is a danger to anyone. She shares that she had reported that pt had been aggressive with her in the past but that she wants to make certain that team knew that she did not believe that pt was a danger to her, himself, or anyone else. [name] stated that she had been informed that pt's delusions were probably fixed and that if this were the case and the facility could not help him then she would like him discharged to her as she had committed him. CSW stated that this was something about which she would have to speak with the provider. She agreed. She shared that she has been researching treatment with ketamine and feels this may be more appropriate to help pt. CSW again encouraged her to speak with the provider and informed her that provider would be given her information. No other concerns expressed. Contact ended without incident.   [name]. Algis Greenhouse, MSW, LCSW, LCAS 09/20/2020 11:02 AM

## 2020-09-20 NOTE — Tx Team (Signed)
Interdisciplinary Treatment and Diagnostic Plan Update  09/20/2020 Time of Session: 9:00AM Connor [name] MRN: [id number]  Principal Diagnosis: Delusional disorder [facility])  Secondary Diagnoses: Principal Problem:   Delusional disorder ([place]) Active Problems:   Autism spectrum disorder associated with known medical or genetic condition or environmental factor, requiring substantial support (level 2)   Major depressive disorder, recurrent episode ([place])   Panic disorder   Current Medications:  Current Facility-Administered Medications  Medication Dose Route Frequency Provider Last Rate Last Admin  . acetaminophen (TYLENOL) tablet 650 mg  650 mg Oral Q6H PRN Clapacs, John T, MD      . alum & mag hydroxide-simeth (MAALOX/MYLANTA) 200-200-20 MG/5ML suspension 30 mL  30 mL Oral Q4H PRN Clapacs, John T, MD      . carbamazepine (TEGRETOL) tablet 200 mg  200 mg Oral BID Clapacs, Madie Reno, MD   200 mg at 09/20/20 0748  . clonazePAM (KLONOPIN) tablet 0.5 mg  0.5 mg Oral BID Clapacs, Madie Reno, MD   0.5 mg at 09/20/20 0748  . desvenlafaxine (PRISTIQ) 24 hr tablet 100 mg  100 mg Oral Daily Clapacs, Madie Reno, MD   100 mg at 09/20/20 0749  . diphenhydrAMINE (BENADRYL) injection 50 mg  50 mg Intramuscular Q2H PRN Clapacs, John T, MD      . gabapentin (NEURONTIN) capsule 300 mg  300 mg Oral TID Salley Scarlet, MD   300 mg at 09/20/20 0748  . hydrALAZINE (APRESOLINE) tablet 10 mg  10 mg Oral Q6H PRN Salley Scarlet, MD      . ibuprofen (ADVIL) tablet 600 mg  600 mg Oral Q6H PRN Clapacs, Madie Reno, MD      . LORazepam (ATIVAN) tablet 2 mg  2 mg Oral Q4H PRN Salley Scarlet, MD   2 mg at 09/19/20 1111   Or  . LORazepam (ATIVAN) injection 2 mg  2 mg Intramuscular Q4H PRN Salley Scarlet, MD      . magnesium hydroxide (MILK OF MAGNESIA) suspension 30 mL  30 mL Oral Daily PRN Clapacs, Madie Reno, MD      . multivitamin with minerals tablet 1 tablet  1 tablet Oral Daily Clapacs, Madie Reno, MD   1 tablet at  09/20/20 [phone]  . pantoprazole (PROTONIX) EC tablet 40 mg  40 mg Oral Daily Clapacs, Madie Reno, MD   40 mg at 09/20/20 0748  . polyethylene glycol (MIRALAX / GLYCOLAX) packet 17 g  17 g Oral Daily Clapacs, John T, MD   17 g at 09/20/20 2395   PTA Medications: Medications Prior to Admission  Medication Sig Dispense Refill Last Dose  . carbamazepine (TEGRETOL) 200 MG tablet Take 400 mg by mouth at bedtime.     . carbamazepine (TEGRETOL) 200 MG tablet Take 200 mg by mouth daily.     . clonazePAM (KLONOPIN) 0.5 MG tablet Take 1 tablet (0.5 mg total) by mouth 2 (two) times daily for 10 days. 20 tablet 0   . desvenlafaxine (PRISTIQ) 100 MG 24 hr tablet Take 100 mg by mouth daily.     . [EXPIRED] econazole nitrate 1 % cream Apply topically 2 (two) times daily for 14 days. Apply to red rash in groin 30 g 1   . Multiple Vitamins-Minerals (MULTIVITAMIN GUMMIES MENS) CHEW Chew 1 tablet by mouth daily.     . pantoprazole (PROTONIX) 40 MG tablet Take 1 tablet (40 mg total) by mouth daily. 30 tablet 0   . polyethylene glycol powder (GLYCOLAX/MIRALAX) 17  GM/SCOOP powder Take 255 g by mouth daily. (Patient taking differently: Take 1 Container by mouth daily as needed for severe constipation.) 255 g 3   . sucralfate (CARAFATE) 1 GM/10ML suspension Take 10 mLs by mouth 4 (four) times daily as needed (stomach).     . triamcinolone (KENALOG) 0.1 % Apply 1 application topically 3 (three) times daily as needed (skin irritation or burns).       Patient Stressors:    Patient Strengths:    Treatment Modalities: Medication Management, Group therapy, Case management,  1 to 1 session with clinician, Psychoeducation, Recreational therapy.   Physician Treatment Plan for Primary Diagnosis: Delusional disorder [facility]) Long Term Goal(s): Improvement in symptoms so as ready for discharge Improvement in symptoms so as ready for discharge   Short Term Goals: Ability to identify changes in lifestyle to reduce recurrence of  condition will improve Ability to verbalize feelings will improve Ability to disclose and discuss suicidal ideas Ability to demonstrate self-control will improve Ability to identify and develop effective coping behaviors will improve Ability to maintain clinical measurements within normal limits will improve Compliance with prescribed medications will improve Ability to identify changes in lifestyle to reduce recurrence of condition will improve Ability to verbalize feelings will improve Ability to disclose and discuss suicidal ideas Ability to demonstrate self-control will improve Ability to identify and develop effective coping behaviors will improve Ability to maintain clinical measurements within normal limits will improve Compliance with prescribed medications will improve  Medication Management: Evaluate patient's response, side effects, and tolerance of medication regimen.  Therapeutic Interventions: 1 to 1 sessions, Unit Group sessions and Medication administration.  Evaluation of Outcomes: Not Met  Physician Treatment Plan for Secondary Diagnosis: Principal Problem:   Delusional disorder ([place]) Active Problems:   Autism spectrum disorder associated with known medical or genetic condition or environmental factor, requiring substantial support (level 2)   Major depressive disorder, recurrent episode ([place])   Panic disorder  Long Term Goal(s): Improvement in symptoms so as ready for discharge Improvement in symptoms so as ready for discharge   Short Term Goals: Ability to identify changes in lifestyle to reduce recurrence of condition will improve Ability to verbalize feelings will improve Ability to disclose and discuss suicidal ideas Ability to demonstrate self-control will improve Ability to identify and develop effective coping behaviors will improve Ability to maintain clinical measurements within normal limits will improve Compliance with prescribed medications will  improve Ability to identify changes in lifestyle to reduce recurrence of condition will improve Ability to verbalize feelings will improve Ability to disclose and discuss suicidal ideas Ability to demonstrate self-control will improve Ability to identify and develop effective coping behaviors will improve Ability to maintain clinical measurements within normal limits will improve Compliance with prescribed medications will improve     Medication Management: Evaluate patient's response, side effects, and tolerance of medication regimen.  Therapeutic Interventions: 1 to 1 sessions, Unit Group sessions and Medication administration.  Evaluation of Outcomes: Not Met   RN Treatment Plan for Primary Diagnosis: Delusional disorder ([place]) Long Term Goal(s): Knowledge of disease and therapeutic regimen to maintain health will improve  Short Term Goals: Ability to remain free from injury will improve, Ability to verbalize frustration and anger appropriately will improve, Ability to demonstrate self-control, Ability to participate in decision making will improve, Ability to verbalize feelings will improve, Ability to identify and develop effective coping behaviors will improve and Compliance with prescribed medications will improve  Medication Management: RN will administer medications as  ordered by provider, will assess and evaluate patient's response and provide education to patient for prescribed medication. RN will report any adverse and/or side effects to prescribing provider.  Therapeutic Interventions: 1 on 1 counseling sessions, Psychoeducation, Medication administration, Evaluate responses to treatment, Monitor vital signs and CBGs as ordered, Perform/monitor CIWA, COWS, AIMS and Fall Risk screenings as ordered, Perform wound care treatments as ordered.  Evaluation of Outcomes: Not Met   LCSW Treatment Plan for Primary Diagnosis: Delusional disorder [facility]) Long Term Goal(s): Safe transition  to appropriate next level of care at discharge, Engage patient in therapeutic group addressing interpersonal concerns.  Short Term Goals: Engage patient in aftercare planning with referrals and resources, Increase social support, Increase ability to appropriately verbalize feelings, Increase emotional regulation, Facilitate acceptance of mental health diagnosis and concerns, Identify triggers associated with mental health/substance abuse issues and Increase skills for wellness and recovery  Therapeutic Interventions: Assess for all discharge needs, 1 to 1 time with Social worker, Explore available resources and support systems, Assess for adequacy in community support network, Educate family and significant other(s) on suicide prevention, Complete Psychosocial Assessment, Interpersonal group therapy.  Evaluation of Outcomes: Not Met   Progress in Treatment: Attending groups: Yes. Participating in groups: Yes. Taking medication as prescribed: Yes. Toleration medication: Yes. Family/Significant other contact made: Yes, individual(s) contacted:  mother, Marquez Ceesay. Patient understands diagnosis: No. Discussing patient identified problems/goals with staff: Yes. Medical problems stabilized or resolved: Yes. Denies suicidal/homicidal ideation: Yes. Issues/concerns per patient self-inventory: No. Other: None.  New problem(s) identified: No, Describe:  none.  New Short Term/Long Term Goal(s): elimination of symptoms of psychosis, medication management for mood stabilization; elimination of SI thoughts; development of comprehensive mental wellness plan.  Patient Goals: "I don't know." He also expressed interest in feeling better and improving his sleep.    Discharge Plan or Barriers: CSW will assist patient with development of an appropriate discharge/aftercare plan.   Reason for Continuation of Hospitalization: Delusions  Medication stabilization  Estimated Length of Stay: 1-7  days  Attendees: Patient: Connor [name] 09/20/2020 9:55 AM  Physician: Selina Cooley, MD 09/20/2020 9:55 AM  Nursing: West Pugh, RN 09/20/2020 9:55 AM  RN Care Manager: 09/20/2020 9:55 AM  Social Worker: Chalmers Guest. [name], MSW, [place], [place] 09/20/2020 9:55 AM  Recreational Therapist: Devin Going, LRT  09/20/2020 9:55 AM  Other: Kiva Martinique, MSW, LCSW-A 09/20/2020 9:55 AM  Other:  09/20/2020 9:55 AM  Other: 09/20/2020 9:55 AM    Scribe for Treatment Team: Shirl Harris, LCSW 09/20/2020 9:55 AM

## 2020-09-20 NOTE — Plan of Care (Signed)
Pt self isolates in room sleeping at this time. Pt denies SI/HI/AVH. Hs med given. Pt denies pain. Q 15 min safety checks maintained.  Problem: Education: Goal: Knowledge of [facility] Education information/materials will improve Outcome: Progressing Goal: Emotional status will improve Outcome: Progressing Goal: Mental status will improve Outcome: Progressing Goal: Verbalization of understanding the information provided will improve Outcome: Progressing   Problem: Safety: Goal: Periods of time without injury will increase Outcome: Progressing

## 2020-09-20 NOTE — Progress Notes (Signed)
[facility] MD Progress Note  09/20/2020 12:32 PM Connor [name]  MRN:  [id number]   CC "I need some cytokine therapy."  Subjective:  20 year old male presenting with a fixed delusion that he is suffering from acute radiation poisoning. He had no acute events overnight, medication compliant, attending to ADLs.   Patient seen during treatment team and again one-on-one. While he continues to speak about his delusions he is significantly calmer today. He has not yelled out in distress since yesterday around lunch time. He continues to report some bone pain, feeling that his hair is on fire, and that there may be some bacteria in his lungs. Today I offered to allow him to come into office to view lab results on computer screen, but he declines. Reminded him of his normal medical work up thus far, and reassured him that there were no signs of cell break down or need for transplant. While he appears somewhat skeptical, he no longer tries to argue this point with me. He states he does not want to kill himself or anyone else. He denies any visual or auditory hallucinations. He notes that he is feeling homesick, and wants to go home. He gives permission to speak with his mother.   Spoke with his mother, Nori Poland, [phone]: She wanted to reiterate that he did not feel the patient was a danger to himself as she felt he was too afraid to harm himself. She also did not feel that he was going to kill her. She fears that this hospital will be unable to help her son, and she questions if she should take him to outpatient psychiatrist for ketamine. She also requested that we restart Geodon that he has been on for over a year without side effects, and also requests help finding him a therapist to speak to. Updated her on progress noted since I last saw her son in the emergency room March 6th, and also updated on medication changes made this admission. Discussed ketamine mainly indicated for depression as  opposed to delusion in current literature, but encouraged her to speak with her provider after discharge. Also informed that I did not feel comfortable discharging patient today.   Principal Problem: Delusional disorder (HCC) Diagnosis: Principal Problem:   Delusional disorder (HCC) Active Problems:   Autism spectrum disorder associated with known medical or genetic condition or environmental factor, requiring substantial support (level 2)   Major depressive disorder, recurrent episode (HCC)   Panic disorder  Total Time spent with patient: 30 minutes  Past Psychiatric History: See H&P  Past Medical History:  Past Medical History:  Diagnosis Date  . ADHD (attention deficit hyperactivity disorder)   . Autism   . Depressed   . Neurofibromatosis (HCC)    Type 1  . Obesity   . Pertussis    as a infant    Past Surgical History:  Procedure Laterality Date  . MRI    . RADIOLOGY WITH ANESTHESIA N/A 08/13/2012   Procedure: RADIOLOGY WITH ANESTHESIA;  Surgeon: Medication Radiologist, MD;  Location: [facility] OR;  Service: Radiology;  Laterality: N/A;  MRI    Family History:  Family History  Problem Relation Age of Onset  . Anxiety disorder Mother   . Depression Mother   . OCD Mother   . Hypertension Mother   . Cancer Maternal Aunt   . Cancer Maternal Grandmother   . Hypertension Father   . Schizophrenia Maternal Uncle    Family Psychiatric  History: See H&P Social History:  Social History   Substance and Sexual Activity  Alcohol Use No  . Alcohol/week: 0.0 standard drinks     Social History   Substance and Sexual Activity  Drug Use No    Social History   Socioeconomic History  . Marital status: Single    Spouse name: Not on file  . Number of children: Not on file  . Years of education: Not on file  . Highest education level: Not on file  Occupational History  . Not on file  Tobacco Use  . Smoking status: Former Smoker    Packs/day: 1.00    Years: 1.00    Pack  years: 1.00    Types: Cigarettes    Quit date: 12/31/2014    Years since quitting: 5.7  . Smokeless tobacco: Never Used  Substance and Sexual Activity  . Alcohol use: No    Alcohol/week: 0.0 standard drinks  . Drug use: No  . Sexual activity: Not Currently    Birth control/protection: None  Other Topics Concern  . Not on file  Social History Narrative   Khi is a high school drop out.   He lives with his mom only. He has one sister.   He enjoys eating, sleeping, and watching tv.   Social Determinants of Health   Financial Resource Strain: Not on file  Food Insecurity: Not on file  Transportation Needs: Not on file  Physical Activity: Not on file  Stress: Not on file  Social Connections: Not on file   Additional Social History:     Sleep: Patient reports poor sleep, but documented 7.5 hours  Appetite:  Fair  Current Medications: Current Facility-Administered Medications  Medication Dose Route Frequency Provider Last Rate Last Admin  . acetaminophen (TYLENOL) tablet 650 mg  650 mg Oral Q6H PRN Clapacs, John T, MD      . alum & mag hydroxide-simeth (MAALOX/MYLANTA) 200-200-20 MG/5ML suspension 30 mL  30 mL Oral Q4H PRN Clapacs, Jackquline Denmark, MD      . Melene Muller ON 09/21/2020] carbamazepine (TEGRETOL) tablet 200 mg  200 mg Oral Daily Jesse Sans, MD      . carbamazepine (TEGRETOL) tablet 400 mg  400 mg Oral QHS Jesse Sans, MD      . clonazePAM Scarlette [place]) tablet 0.5 mg  0.5 mg Oral BID Clapacs, Jackquline Denmark, MD   0.5 mg at 09/20/20 0748  . desvenlafaxine (PRISTIQ) 24 hr tablet 100 mg  100 mg Oral Daily Clapacs, Jackquline Denmark, MD   100 mg at 09/20/20 0749  . diphenhydrAMINE (BENADRYL) injection 50 mg  50 mg Intramuscular Q2H PRN Clapacs, John T, MD      . gabapentin (NEURONTIN) capsule 600 mg  600 mg Oral TID Jesse Sans, MD      . hydrALAZINE (APRESOLINE) tablet 10 mg  10 mg Oral Q6H PRN Jesse Sans, MD      . ibuprofen (ADVIL) tablet 600 mg  600 mg Oral Q6H PRN Clapacs, Jackquline Denmark, MD      . LORazepam (ATIVAN) tablet 2 mg  2 mg Oral Q4H PRN Jesse Sans, MD   2 mg at 09/19/20 1111   Or  . LORazepam (ATIVAN) injection 2 mg  2 mg Intramuscular Q4H PRN Jesse Sans, MD      . magnesium hydroxide (MILK OF MAGNESIA) suspension 30 mL  30 mL Oral Daily PRN Clapacs, John T, MD      . multivitamin with minerals tablet 1 tablet  1  tablet Oral Daily Clapacs, Jackquline Denmark, MD   1 tablet at 09/20/20 [phone]  . pantoprazole (PROTONIX) EC tablet 40 mg  40 mg Oral Daily Clapacs, Jackquline Denmark, MD   40 mg at 09/20/20 0748  . polyethylene glycol (MIRALAX / GLYCOLAX) packet 17 g  17 g Oral Daily Clapacs, Jackquline Denmark, MD   17 g at 09/20/20 0748  . ziprasidone (GEODON) capsule 40 mg  40 mg Oral Q supper Jesse Sans, MD        Lab Results: No results found for this or any previous visit (from the past 48 hour(s)).  Blood Alcohol level:  Lab Results  Component Value Date   ETH <10 09/05/2020   ETH <10 09/01/2020    Metabolic Disorder Labs: Lab Results  Component Value Date   HGBA1C 4.9 07/09/2017   MPG 93.93 07/09/2017   Lab Results  Component Value Date   PROLACTIN 3.4 (L) 08/08/2016   Lab Results  Component Value Date   CHOL 164 07/09/2017   TRIG 171 (H) 07/09/2017   HDL 35 (L) 07/09/2017   CHOLHDL 4.7 07/09/2017   VLDL 34 07/09/2017   LDLCALC 95 07/09/2017   LDLCALC 105 08/08/2016    Physical Findings: AIMS:  , ,  ,  ,    CIWA:    COWS:     Musculoskeletal: Strength & Muscle Tone: within normal limits Gait & Station: normal Patient leans: N/A  Psychiatric Specialty Exam:  Presentation  General Appearance: Casual  Eye Contact:Fair  Speech:Clear and Coherent  Speech Volume:Normal  Handedness:Right   Mood and Affect  Mood:Anxious  Affect:Congruent   Thought Process  Thought Processes:Goal Directed  Descriptions of Associations:Circumstantial  Orientation:Full (Time, Place and Person)  Thought Content:Rumination; Perseveration; Delusions;  Obsessions  History of Schizophrenia/Schizoaffective disorder:No  Duration of Psychotic Symptoms:Less than six months  Hallucinations:Hallucinations: Tactile  Ideas of Reference:Delusions  Suicidal Thoughts:Suicidal Thoughts: No  Homicidal Thoughts:Homicidal Thoughts: No   Sensorium  Memory:Immediate Fair; Recent Fair; Remote Fair  Judgment:Impaired  Insight:Lacking   Executive Functions  Concentration:Fair  Attention Span:Fair  Recall:Fair  Fund of Knowledge:Fair  Language:Fair   Psychomotor Activity  Psychomotor Activity:Psychomotor Activity: Normal   Assets  Assets:Desire for Improvement; Financial Resources/Insurance; Housing; Physical Health; Social Support   Sleep  Sleep:Sleep: Fair Number of Hours of Sleep: 7.15    Physical Exam: Physical Exam ROS Blood pressure (!) 136/99, pulse 93, temperature 98.3 F (36.8 C), temperature source Oral, resp. rate 18, height 6\' 4"  (1.93 m), weight 120.8 kg, SpO2 97 %. Body mass index is 32.42 kg/m.   Treatment Plan Summary: Daily contact with patient to assess and evaluate symptoms and progress in treatment and Medication management 1) Delusional Disorder vs illness anxiety disorder vs OCD vs underlying unspecified psychosis- unstable - Increase egretol 200 mg in the morning, 400 mg at nighttime, Pristiq 100 mg daily, Klonopin 0.5 mg BID. Increase gabapentin 600 mg TID for reported neuropathic pain and off label for anxiety. Restart Geodon 40 mg with super, and monitor for EPS. Ativan Q4hr prn for agitation  2) Elevated blood pressure reading- new problem - Start Amlodipine 5 mg daily, continue hydralazine PRN    , MD 09/20/2020, 12:32 PM

## 2020-09-20 NOTE — Progress Notes (Signed)
Recreation Therapy Notes  INPATIENT RECREATION TR PLAN  Patient Details Name: Connor [name] MRN: [id number] DOB: September 23, 2000 Today's Date: 09/20/2020  Rec Therapy Plan Is patient appropriate for Therapeutic Recreation?: Yes Treatment times per week: at least 3 Estimated Length of Stay: 5-7 days TR Treatment/Interventions: Group participation (Comment)  Discharge Criteria Pt will be discharged from therapy if:: Discharged Treatment plan/goals/alternatives discussed and agreed upon by:: Patient/family  Discharge Summary     Wave Calzada 09/20/2020, 2:22 PM

## 2020-09-20 NOTE — Progress Notes (Signed)
Patient was cooperative with assessment. He denies SI, HI, and AVH. Patient states he could not sleep due to the "burning". He continues to state the burning from the radiation is causing him extreme pain and that the medication is not helping him. Patient remains isolative to his room after breakfast this morning.  Patient is medication compliant. Support and encouragement provided. Patient remains safe on the unit at this time and q15 min safety checks are maintained.

## 2020-09-20 NOTE — Progress Notes (Signed)
Pt remained in his room sleeping. Room door shut and room totally dark.  Pt denies SI/HI/AVH at this time. 1 blood stain seen on the patient's pillow. The patient reports that he accidentally bit his tongue sometime today, and the blood came from that. Oral assessment showed no further bleeding. Pt's sclera noted to be very red. Hs meds administered. Q15 min safety checks maintained.

## 2020-09-20 NOTE — BHH Group Notes (Signed)
LCSW Group Therapy Note  09/20/2020 2:06 PM  Type of Therapy/Topic:  Group Therapy:  Emotion Regulation  Participation Level:  None   Description of Group:   The purpose of this group is to assist patients in learning to regulate negative emotions and experience positive emotions. Patients will be guided to discuss ways in which they have been vulnerable to their negative emotions. These vulnerabilities will be juxtaposed with experiences of positive emotions or situations, and patients will be challenged to use positive emotions to combat negative ones. Special emphasis will be placed on coping with negative emotions in conflict situations, and patients will process healthy conflict resolution skills.  Therapeutic Goals: 1. Patient will identify two positive emotions or experiences to reflect on in order to balance out negative emotions 2. Patient will label two or more emotions that they find the most difficult to experience 3. Patient will demonstrate positive conflict resolution skills through discussion and/or role plays  Summary of Patient Progress: Patient was physically present for the entirety of the group. Outside of responding to the ice breaker, he did not speak but did appear to attend to the discussion.  Therapeutic Modalities:   Cognitive Behavioral Therapy Feelings Identification Dialectical Behavioral Therapy  [name]. Algis Greenhouse, MSW, LCSW, LCAS 09/20/2020 2:06 PM

## 2020-09-20 NOTE — Progress Notes (Signed)
Recreation Therapy Notes  INPATIENT RECREATION THERAPY ASSESSMENT  Patient Details Name: Connor [name] MRN: [id number] DOB: 2000-08-30 Today's Date: 09/20/2020       Information Obtained From: Patient  Able to Participate in Assessment/Interview: Yes  Patient Presentation: Responsive  Reason for Admission (Per Patient): Active Symptoms  Patient Stressors: Other (Comment) (Radiation in my body)  Coping Skills:   TV,Prayer,Read,Other (Comment) (Walk in nature)  Leisure Interests (2+):  Games - Video games,Individual - TV,Music - Listen,Exercise - Walking  Frequency of Recreation/Participation: Weekly  Awareness of Community Resources:  Yes  Community Resources:     Current Use: No  If no, Barriers?: Transportation,Attitudinal,Financial  Expressed Interest in [employer] Information: No  [place]idence:  [place]  Patient Main Form of Transportation: Other (Comment) (My mom)  Patient Strengths:  Maryjane Hurter, Funny, Sensitive  Patient Identified Areas of Improvement:  Getting the right treatment  Patient Goal for Hospitalization:  Feel better and get some sleep  Current SI (including self-harm):  No  Current HI:  No  Current AVH: No  Staff Intervention Plan: Group Attendance,Collaborate with Interdisciplinary Treatment Team  Consent to Intern Participation: N/A  Connor [name] 09/20/2020, 2:20 PM

## 2020-09-20 NOTE — Care Management Important Message (Signed)
Important Message  Patient Details  Name: Connor [name] MRN: [id number] Date of Birth: Feb 05, 2001   Medicare Important Message Given:  Yes     Glenis Smoker, LCSW 09/20/2020, 10:56 AM

## 2020-09-21 DIAGNOSIS — F22 Delusional disorders: Principal | ICD-10-CM

## 2020-09-21 MED ORDER — GABAPENTIN 400 MG PO CAPS
800.0000 mg | ORAL_CAPSULE | Freq: Three times a day (TID) | ORAL | Status: DC
  Administered 2020-09-21 – 2020-09-22 (×4): 800 mg via ORAL
  Filled 2020-09-21 (×4): qty 2

## 2020-09-21 NOTE — Progress Notes (Signed)
Recreation Therapy Notes  Date: 09/21/2020   Time: 10:00 am   Location: Craft room     Behavioral response: N/A   Intervention Topic: Leisure    Discussion/Intervention: Patient did not attend group.   Clinical Observations/Feedback:  Patient did not attend group.   Timothy Townsel LRT/CTRS        Osborn Pullin 09/21/2020 10:16 AM

## 2020-09-21 NOTE — Progress Notes (Signed)
[facility] MD Progress Note  09/21/2020 11:21 AM Connor [name]  MRN:  [id number]   CC "I'm okay"  Subjective:  20 year old male presenting with a fixed delusion that he is suffering from acute radiation poisoning. He had no acute events overnight, medication compliant, attending to ADLs.   Patient observed watching television in the day room with all the lights on. This is a significant improvement to his time in the emergency room where he would scream out if any lights were on near him. He appears calm, and it take him nearly two minutes of conversation to bring up his delusion of acute radiation poisoning. He continues to complain of burning sharp pains and a swelling sensation in his head. He continues to state that this is real radiation poisoning and not a psychiatric illness. He continues to be only minimally reassured when discussing his lab and medical work up. He is agreeable to increase in gabapentin for neuropathic pain. He denies any suicidal ideations, homicidal ideations, visual hallucinations, and auditory hallucinations. Patient would qualify for an ACT Team given his numerous presentations to the emergency room, but remains unsure if this is a good fit for him. Social work team has provided him with information on ACT Teams and their associated services to ponder.   Principal Problem: Delusional disorder (HCC) Diagnosis: Principal Problem:   Delusional disorder (HCC) Active Problems:   Autism spectrum disorder associated with known medical or genetic condition or environmental factor, requiring substantial support (level 2)   Major depressive disorder, recurrent episode (HCC)   Panic disorder  Total Time spent with patient: 30 minutes  Past Psychiatric History: See H&P  Past Medical History:  Past Medical History:  Diagnosis Date  . ADHD (attention deficit hyperactivity disorder)   . Autism   . Depressed   . Neurofibromatosis (HCC)    Type 1  . Obesity   .  Pertussis    as a infant    Past Surgical History:  Procedure Laterality Date  . MRI    . RADIOLOGY WITH ANESTHESIA N/A 08/13/2012   Procedure: RADIOLOGY WITH ANESTHESIA;  Surgeon: Medication Radiologist, MD;  Location: [facility] OR;  Service: Radiology;  Laterality: N/A;  MRI    Family History:  Family History  Problem Relation Age of Onset  . Anxiety disorder Mother   . Depression Mother   . OCD Mother   . Hypertension Mother   . Cancer Maternal Aunt   . Cancer Maternal Grandmother   . Hypertension Father   . Schizophrenia Maternal Uncle    Family Psychiatric  History: See H&P Social History:  Social History   Substance and Sexual Activity  Alcohol Use No  . Alcohol/week: 0.0 standard drinks     Social History   Substance and Sexual Activity  Drug Use No    Social History   Socioeconomic History  . Marital status: Single    Spouse name: Not on file  . Number of children: Not on file  . Years of education: Not on file  . Highest education level: Not on file  Occupational History  . Not on file  Tobacco Use  . Smoking status: Former Smoker    Packs/day: 1.00    Years: 1.00    Pack years: 1.00    Types: Cigarettes    Quit date: 12/31/2014    Years since quitting: 5.7  . Smokeless tobacco: Never Used  Substance and Sexual Activity  . Alcohol use: No    Alcohol/week: 0.0 standard  drinks  . Drug use: No  . Sexual activity: Not Currently    Birth control/protection: None  Other Topics Concern  . Not on file  Social History Narrative   Herb is a high school drop out.   He lives with his mom only. He has one sister.   He enjoys eating, sleeping, and watching tv.   Social Determinants of Health   Financial Resource Strain: Not on file  Food Insecurity: Not on file  Transportation Needs: Not on file  Physical Activity: Not on file  Stress: Not on file  Social Connections: Not on file   Additional Social History:     Sleep: Patient reports poor sleep, but  documented 8.25 hours  Appetite:  Fair  Current Medications: Current Facility-Administered Medications  Medication Dose Route Frequency Provider Last Rate Last Admin  . acetaminophen (TYLENOL) tablet 650 mg  650 mg Oral Q6H PRN Clapacs, John T, MD      . alum & mag hydroxide-simeth (MAALOX/MYLANTA) 200-200-20 MG/5ML suspension 30 mL  30 mL Oral Q4H PRN Clapacs, John T, MD      . amLODipine (NORVASC) tablet 5 mg  5 mg Oral Daily Jesse Sans, MD   5 mg at 09/21/20 0948  . carbamazepine (TEGRETOL) tablet 200 mg  200 mg Oral Daily Jesse Sans, MD   200 mg at 09/21/20 0948  . carbamazepine (TEGRETOL) tablet 400 mg  400 mg Oral QHS Jesse Sans, MD   400 mg at 09/20/20 2119  . clonazePAM (KLONOPIN) tablet 0.5 mg  0.5 mg Oral BID Clapacs, Jackquline Denmark, MD   0.5 mg at 09/21/20 0948  . desvenlafaxine (PRISTIQ) 24 hr tablet 100 mg  100 mg Oral Daily Clapacs, Jackquline Denmark, MD   100 mg at 09/21/20 0949  . diphenhydrAMINE (BENADRYL) injection 50 mg  50 mg Intramuscular Q2H PRN Clapacs, John T, MD      . gabapentin (NEURONTIN) capsule 800 mg  800 mg Oral TID Jesse Sans, MD      . hydrALAZINE (APRESOLINE) tablet 10 mg  10 mg Oral Q6H PRN Jesse Sans, MD      . ibuprofen (ADVIL) tablet 600 mg  600 mg Oral Q6H PRN Clapacs, Jackquline Denmark, MD      . LORazepam (ATIVAN) tablet 2 mg  2 mg Oral Q4H PRN Jesse Sans, MD   2 mg at 09/19/20 1111   Or  . LORazepam (ATIVAN) injection 2 mg  2 mg Intramuscular Q4H PRN Jesse Sans, MD      . magnesium hydroxide (MILK OF MAGNESIA) suspension 30 mL  30 mL Oral Daily PRN Clapacs, Jackquline Denmark, MD   30 mL at 09/21/20 0952  . multivitamin with minerals tablet 1 tablet  1 tablet Oral Daily Clapacs, Jackquline Denmark, MD   1 tablet at 09/21/20 0947  . pantoprazole (PROTONIX) EC tablet 40 mg  40 mg Oral Daily Clapacs, Jackquline Denmark, MD   40 mg at 09/21/20 0947  . polyethylene glycol (MIRALAX / GLYCOLAX) packet 17 g  17 g Oral Daily Clapacs, John T, MD   17 g at 09/21/20 0947  .  ziprasidone (GEODON) capsule 40 mg  40 mg Oral Q supper Jesse Sans, MD   40 mg at 09/20/20 1630    Lab Results: No results found for this or any previous visit (from the past 48 hour(s)).  Blood Alcohol level:  Lab Results  Component Value Date   ETH <10 09/05/2020  ETH <10 09/01/2020    Metabolic Disorder Labs: Lab Results  Component Value Date   HGBA1C 4.9 07/09/2017   MPG 93.93 07/09/2017   Lab Results  Component Value Date   PROLACTIN 3.4 (L) 08/08/2016   Lab Results  Component Value Date   CHOL 164 07/09/2017   TRIG 171 (H) 07/09/2017   HDL 35 (L) 07/09/2017   CHOLHDL 4.7 07/09/2017   VLDL 34 07/09/2017   LDLCALC 95 07/09/2017   LDLCALC 105 08/08/2016    Physical Findings: AIMS: Facial and Oral Movements Muscles of Facial Expression: None, normal Lips and Perioral Area: None, normal Jaw: None, normal Tongue: None, normal,Extremity Movements Upper (arms, wrists, hands, fingers): None, normal Lower (legs, knees, ankles, toes): None, normal, Trunk Movements Neck, shoulders, hips: None, normal, Overall Severity Severity of abnormal movements (highest score from questions above): None, normal Incapacitation due to abnormal movements: None, normal Patient's awareness of abnormal movements (rate only patient's report): No Awareness, Dental Status Current problems with teeth and/or dentures?: No Does patient usually wear dentures?: No  CIWA:    COWS:     Musculoskeletal: Strength & Muscle Tone: within normal limits Gait & Station: normal Patient leans: N/A  Psychiatric Specialty Exam:  Presentation  General Appearance: Casual  Eye Contact:Fair  Speech:Clear and Coherent  Speech Volume:Normal  Handedness:Right   Mood and Affect  Mood:Anxious  Affect:Congruent   Thought Process  Thought Processes:Goal Directed  Descriptions of Associations:Circumstantial  Orientation:Full (Time, Place and Person)  Thought Content:Rumination;  Perseveration; Delusions; Obsessions  History of Schizophrenia/Schizoaffective disorder:No  Duration of Psychotic Symptoms:Less than six months  Hallucinations:Hallucinations: Tactile  Ideas of Reference:Delusions  Suicidal Thoughts:Suicidal Thoughts: No  Homicidal Thoughts:Homicidal Thoughts: No   Sensorium  Memory:Immediate Fair; Recent Fair; Remote Fair  Judgment:Impaired  Insight:Lacking   Executive Functions  Concentration:Fair  Attention Span:Fair  Recall:Fair  Fund of Knowledge:Fair  Language:Fair   Psychomotor Activity  Psychomotor Activity:Psychomotor Activity: Normal   Assets  Assets:Desire for Improvement; Financial Resources/Insurance; Housing; Physical Health; Social Support   Sleep  Sleep: Good, 8.25 hours   Physical Exam: Physical Exam  ROS  Blood pressure 122/87, pulse 87, temperature 97.9 F (36.6 C), temperature source Oral, resp. rate 17, height 6\' 4"  (1.93 m), weight 120.8 kg, SpO2 100 %. Body mass index is 32.42 kg/m.   Treatment Plan Summary: Daily contact with patient to assess and evaluate symptoms and progress in treatment and Medication management 1) Delusional Disorder vs illness anxiety disorder vs OCD vs underlying unspecified psychosis- unstable - Continue Tegretol 200 mg in the morning, 400 mg at nighttime, Pristiq 100 mg daily, Klonopin 0.5 mg BID. Increase gabapentin 800 mg TID for reported neuropathic pain and off label for anxiety. Continue Geodon 40 mg with super, and monitor for EPS. Ativan Q4hr prn for agitation  2) HTN, improving - Continue Amlodipine 5 mg daily, continue hydralazine PRN    , MD 09/21/2020, 11:21 AM

## 2020-09-21 NOTE — BHH Counselor (Signed)
CSW spoke with pt about ACTT. He stated that he had been trying to connect with ACTT services two years ago but that he felt it was not a good fit for him. CSW stated that pt could be given more information on ACTT services to review. He agreed. Pt stated that his mother probably had information on ACTT and that CSW could speak with her about it. CSW informed pt that as he is an adult the decision was ultimately up to him. Pt was encouraged to think it over and CSW would give him some information on this service. He agreed. No other concerns expressed. Contact ended without incident.   Pt given information on ACTT services.  [name]. Algis Greenhouse, MSW, LCSW, LCAS 09/21/2020 11:08 AM

## 2020-09-21 NOTE — Plan of Care (Signed)
Patient presents with delusional thinking   Problem: Education: Goal: Emotional status will improve Outcome: Not Progressing Goal: Mental status will improve Outcome: Not Progressing

## 2020-09-21 NOTE — BHH Group Notes (Signed)
LCSW Group Therapy Note     09/21/2020 1:49 PM     Type of Therapy/Topic:  Group Therapy:  Balance in Life     Participation Level:  Active     Description of Group:    This group will address the concept of balance and how it feels and looks when one is unbalanced. Patients will be encouraged to process areas in their lives that are out of balance and identify reasons for remaining unbalanced. Facilitators will guide patients in utilizing problem-solving interventions to address and correct the stressor making their life unbalanced. Understanding and applying boundaries will be explored and addressed for obtaining and maintaining a balanced life. Patients will be encouraged to explore ways to assertively make their unbalanced needs known to significant others in their lives, using other group members and facilitator for support and feedback.     Therapeutic Goals:  1.      Patient will identify two or more emotions or situations they have that consume much of in their lives.  2.      Patient will identify signs/triggers that life has become out of balance:  3.      Patient will identify two ways to set boundaries in order to achieve balance in their lives:  4.      Patient will demonstrate ability to communicate their needs through discussion and/or role plays     Summary of Patient Progress: Pt discussed that he has situations that are overwhelming to him and discussed that much of it stems from his radiation exposure. CSW discussed different coping mechanisms that can help him cope with his distress.     Therapeutic Modalities:   Cognitive Behavioral Therapy  Solution-Focused Therapy  Assertiveness Training     Abir Craine [place] MSW, LCSW-A  09/21/2020 1:49 PM

## 2020-09-21 NOTE — Progress Notes (Signed)
Pt is alert and oriented to person, place, but not to situation. Pt has poor insight, and is not receptive to reality orientation. When asked why pt believes he is in the inpatient psychiatric unit or what the admission triggers were, pt states he is here due to radiation poisoning. Pt yells out at times, screams the calms himself. Pt spends time in his room, at times walks into the dayroom. Pt is medication compliant. Pt yelled out screaming about radiation and was given PRN Ativan PO and Tylenol PO for c/o radiation burning hurting his eyes. These PRNs were effective. Pt was calm and cooperative afterwards and the screaming stopped. Will continue to monitor pt per Q15 minute face checks and monitor for safety.

## 2020-09-21 NOTE — Progress Notes (Signed)
Patient isolative to his room this evening. Pt is preoccupied with being exposed to radiation. Pt given education, support, and encouragement. Patient compliant with medication administration per MD orders. Pt being monitored Q 15 minutes for safety per unit protocol. Pt remains safe on the unit.

## 2020-09-22 MED ORDER — DESVENLAFAXINE SUCCINATE ER 100 MG PO TB24: 100.0000 mg | ORAL_TABLET | Freq: Every day | ORAL | 1 refills | Status: DC

## 2020-09-22 MED ORDER — CARBAMAZEPINE 200 MG PO TABS: ORAL_TABLET | ORAL | 1 refills | Status: DC

## 2020-09-22 MED ORDER — GABAPENTIN 400 MG PO CAPS: 800.0000 mg | ORAL_CAPSULE | Freq: Three times a day (TID) | ORAL | 1 refills | Status: DC

## 2020-09-22 MED ORDER — ZIPRASIDONE HCL 40 MG PO CAPS: 40.0000 mg | ORAL_CAPSULE | Freq: Every day | ORAL | 1 refills | Status: DC

## 2020-09-22 MED ORDER — AMLODIPINE BESYLATE 5 MG PO TABS: 5.0000 mg | ORAL_TABLET | Freq: Every day | ORAL | 1 refills | Status: AC

## 2020-09-22 MED ORDER — CLONAZEPAM 0.5 MG PO TABS: 0.5000 mg | ORAL_TABLET | Freq: Three times a day (TID) | ORAL | 1 refills | Status: AC | PRN

## 2020-09-22 NOTE — BHH Suicide Risk Assessment (Signed)
[facility] Discharge Suicide Risk Assessment   Principal Problem: Delusional disorder [facility]) Discharge Diagnoses: Principal Problem:   Delusional disorder (HCC) Active Problems:   Autism spectrum disorder associated with known medical or genetic condition or environmental factor, requiring substantial support (level 2)   Major depressive disorder, recurrent episode (HCC)   Panic disorder   Total Time spent with patient: 35 minutes- 25 minutes face-to-face contact with patient, 10 minutes documentation, coordination of care, scripts   Musculoskeletal: Strength & Muscle Tone: within normal limits Gait & Station: normal Patient leans: N/A  Psychiatric Specialty Exam: Review of Systems  Constitutional: Negative for appetite change and fatigue.  HENT: Negative for rhinorrhea and sore throat.   Eyes: Negative for photophobia and visual disturbance.  Respiratory: Negative for cough and shortness of breath.   Cardiovascular: Negative for chest pain and palpitations.  Gastrointestinal: Negative for constipation, diarrhea, nausea and vomiting.  Endocrine: Negative for cold intolerance and heat intolerance.  Genitourinary: Negative for difficulty urinating and dysuria.  Musculoskeletal: Negative for neck pain and neck stiffness.  Skin: Negative for rash and wound.  Allergic/Immunologic: Positive for environmental allergies. Negative for food allergies.  Neurological: Negative for dizziness and speech difficulty.  Hematological: Negative for adenopathy. Does not bruise/bleed easily.  Psychiatric/Behavioral: Negative for behavioral problems, hallucinations, sleep disturbance and suicidal ideas. The patient is nervous/anxious.     Blood pressure 117/88, pulse 88, temperature 98 F (36.7 C), temperature source Oral, resp. rate 17, height 6\' 4"  (1.93 m), weight 120.8 kg, SpO2 100 %.Body mass index is 32.42 kg/m.  General Appearance: Fairly Groomed  ::  Fair  Speech:  Clear and Coherent  and Normal Rate  Volume:  Normal  Mood:  Anxious  Affect:  Congruent  Thought Process:  Linear  Orientation:  Full (Time, Place, and Person)  Thought Content:  Delusions  Suicidal Thoughts:  No  Homicidal Thoughts:  No  Memory:  Immediate;   Fair Recent;   Fair Remote;   Fair  Judgement:  Intact  Insight:  Shallow  Psychomotor Activity:  Normal  Concentration:  Fair  Recall:  [phone] of Knowledge:Fair  Language: Fair  Akathisia:  Negative  Handed:  Right  AIMS (if indicated):     Assets:  Communication Skills Desire for Improvement Financial Resources/Insurance Housing Physical Health Resilience Social Support  Sleep:  Number of Hours: 8.5  Cognition: WNL  ADL's:  Intact   Mental Status Per Nursing Assessment::   On Admission:  NA  Demographic Factors:  Male and Caucasian  Loss Factors: Legal issues  Historical Factors: Family history of mental illness or substance abuse and Impulsivity  Risk Reduction Factors:   Sense of responsibility to family, Religious beliefs about death, Living with another person, especially a relative, Positive social support, Positive therapeutic relationship and Positive coping skills or problem solving skills  Continued Clinical Symptoms:  Severe Anxiety and/or Agitation Previous Psychiatric Diagnoses and Treatments  Cognitive Features That Contribute To Risk:  Polarized thinking    Suicide Risk:  Minimal: No identifiable suicidal ideation.  Patients presenting with no risk factors but with morbid ruminations; may be classified as minimal risk based on the severity of the depressive symptoms    Plan Of Care/Follow-up recommendations:  Activity:  as tolerated  Diet:  regular diet  [phone], MD 09/22/2020, 10:25 AM

## 2020-09-22 NOTE — Plan of Care (Signed)
  Problem: Group Participation Goal: STG - Patient will engage in groups without prompting or encouragement from LRT x3 group sessions within 5 recreation therapy group sessions Description: STG - Patient will engage in groups without prompting or encouragement from LRT x3 group sessions within 5 recreation therapy group sessions 09/22/2020 1135 by Ernest Haber, LRT Outcome: Not Applicable 9/0/1222 [id number] by Ernest Haber, LRT Outcome: Not Met (add Reason) Note: Patient spent most of his time in his room

## 2020-09-22 NOTE — Progress Notes (Signed)
Recreation Therapy Notes  INPATIENT RECREATION TR PLAN  Patient Details Name: Connor [name] MRN: [id number] DOB: 04/21/2001 Today's Date: 09/22/2020  Rec Therapy Plan Is patient appropriate for Therapeutic Recreation?: Yes Treatment times per week: at least 3 Estimated Length of Stay: 5-7 days TR Treatment/Interventions: Group participation (Comment)  Discharge Criteria Pt will be discharged from therapy if:: Discharged Treatment plan/goals/alternatives discussed and agreed upon by:: Patient/family  Discharge Summary Short term goals set: Patient will engage in groups without prompting or encouragement from LRT x3 group sessions within 5 recreation therapy group sessions Short term goals met: Not met Progress toward goals comments: Groups attended Which groups?: Social skills Reason goals not met: Patient spent most of his time in his room Therapeutic equipment acquired: N/A Reason patient discharged from therapy: Discharge from hospital Pt/family agrees with progress & goals achieved: Yes Date patient discharged from therapy: 09/22/20   [name] 09/22/2020, 11:36 AM

## 2020-09-22 NOTE — Progress Notes (Signed)
  [facility] Adult Case Management Discharge Plan :  Will you be returning to the same living situation after discharge:  Yes,  pt plans to return home to mother's. At discharge, do you have transportation home?: Yes,  mother to provide transport. Do you have the ability to pay for your medications: Yes,  Medicare/Medicare Part A.  Release of information consent forms completed and in the chart;  Patient's signature needed at discharge.  Patient to Follow up at:  Follow-up Information    [facility].   Why: You can walk-in Monday through Friday between 8-8:30am. If they have an appointment open up they will give you a call. Thanks! Contact information: [address] [place] [address] [phone]               Next level of care provider has access to [facility] Link:no  Safety Planning and Suicide Prevention discussed: Yes,  SPE completed with mother, Cason Luffman.  Have you used any form of tobacco in the last 30 days? (Cigarettes, Smokeless Tobacco, Cigars, and/or Pipes): No  Has patient been referred to the Quitline?: Patient refused referral  Patient has been referred for addiction treatment: Pt. refused referral  Glenis Smoker, LCSW 09/22/2020, 10:50 AM

## 2020-09-22 NOTE — Discharge Summary (Signed)
Physician Discharge Summary Note  Patient:  Connor [name] is an 20 y.o., male MRN:  [id number] DOB:  12/02/2000 Patient phone:  [phone] (home)  Patient address:   [address] [place] [id number],  Total Time spent with patient: 35 minutes- 25 minutes face-to-face contact with patient, 10 minutes documentation, coordination of care, scripts   Date of Admission:  09/18/2020 Date of Discharge: 09/22/2020  Reason for Admission:  20 year old male presenting with a fixed delusion that he is suffering from acute radiation poisoning.  Principal Problem: Delusional disorder [facility](HCC) Discharge Diagnoses: Principal Problem:   Delusional disorder (HCC) Active Problems:   Autism spectrum disorder associated with known medical or genetic condition or environmental factor, requiring substantial support (level 2)   Major depressive disorder, recurrent episode (HCC)   Panic disorder   Past Psychiatric History: History of ASD, ADHD, and unspecified mood disorder. Recent onset of delusion of acute radiation poisoning. Differential includes OCD, illness anxiety disorder, unspecified psychosis. No prior suicide attempts. Two prior admissions to our unit in 2019  Past Medical History:  Past Medical History:  Diagnosis Date  . ADHD (attention deficit hyperactivity disorder)   . Autism   . Depressed   . Neurofibromatosis (HCC)    Type 1  . Obesity   . Pertussis    as a infant    Past Surgical History:  Procedure Laterality Date  . MRI    . RADIOLOGY WITH ANESTHESIA N/A 08/13/2012   Procedure: RADIOLOGY WITH ANESTHESIA;  Surgeon: Medication Radiologist, MD;  Location: [facility] OR;  Service: Radiology;  Laterality: N/A;  MRI    Family History:  Family History  Problem Relation Age of Onset  . Anxiety disorder Mother   . Depression Mother   . OCD Mother   . Hypertension Mother   . Cancer Maternal Aunt   . Cancer Maternal Grandmother   . Hypertension Father   .  Schizophrenia Maternal Uncle    Family Psychiatric  History: schizophrenia in maternal uncle, mother with OCD, depression and anxiety Social History:  Social History   Substance and Sexual Activity  Alcohol Use No  . Alcohol/week: 0.0 standard drinks     Social History   Substance and Sexual Activity  Drug Use No    Social History   Socioeconomic History  . Marital status: Single    Spouse name: Not on file  . Number of children: Not on file  . Years of education: Not on file  . Highest education level: Not on file  Occupational History  . Not on file  Tobacco Use  . Smoking status: Former Smoker    Packs/day: 1.00    Years: 1.00    Pack years: 1.00    Types: Cigarettes    Quit date: 12/31/2014    Years since quitting: 5.7  . Smokeless tobacco: Never Used  Substance and Sexual Activity  . Alcohol use: No    Alcohol/week: 0.0 standard drinks  . Drug use: No  . Sexual activity: Not Currently    Birth control/protection: None  Other Topics Concern  . Not on file  Social History Narrative   Connor [name] is a high school drop out.   He lives with his mom only. He has one sister.   He enjoys eating, sleeping, and watching tv.   Social Determinants of Health   Financial Resource Strain: Not on file  Food Insecurity: Not on file  Transportation Needs: Not on file  Physical Activity: Not on file  Stress: Not on file  Social Connections: Not on file    Hospital Course:  20 year old male presented with a fixed delusion that he is suffering from acute radiation poisoning. This has been ongoing for the last several months since he had a CT scan. He feels that his body is burning all over, that everything is extraordinarily bright, and that all his cell lines are dropping to the point he feels he needs a bone marrow transplant. These beliefs have caused him to come to the emergency department 26 times since December 2021. They have also caused him to become distressed enough to  tell his mother he wanted to kill himself, and kill her for not helping him.  His medical workup has been negative, but delusion remains fixed. While in the emergency room he was tried on some antipsychotics to help with possible underlying psychosis. However, he recently had a severe dystonic reaction leading to medical admission. Once stabilized he was admitted to our unit.   Geodon was restarted at 40 mg with supper and tolerated without any side effects. Carbamazepine was increased to 200 mg daily, 400 mg nightly, klonopin 0.5 mg BID was continued along with Pristiq 100 mg daily. He was also started on gabapentin and titrated to 800 mg TID for neuropathic pain and off-label assistance with continued anxiety. On this regimen patient continues to have delusion of acute radiation sickness, but has been significantly calmer. He was noted to be in the dayroom with the lights on watching TV, and also went outside in direct sunlight to play basketball which are both major improvements in his functioning. He denies suicidal ideations, homicidal ideations, visual hallucinations, and auditory hallucinations. His mother was contacted on day of discharge and agreed that she did not feel he was a danger to himself or her, and felt comfortable picking him up today.   Physical Findings: AIMS: Facial and Oral Movements Muscles of Facial Expression: None, normal Lips and Perioral Area: None, normal Jaw: None, normal Tongue: None, normal,Extremity Movements Upper (arms, wrists, hands, fingers): None, normal Lower (legs, knees, ankles, toes): None, normal, Trunk Movements Neck, shoulders, hips: None, normal, Overall Severity Severity of abnormal movements (highest score from questions above): None, normal Incapacitation due to abnormal movements: None, normal Patient's awareness of abnormal movements (rate only patient's report): No Awareness, Dental Status Current problems with teeth and/or dentures?: No Does  patient usually wear dentures?: No  CIWA:    COWS:     Musculoskeletal: Strength & Muscle Tone: within normal limits Gait & Station: normal Patient leans: N/A                Psychiatric Specialty Exam: General Appearance: Fairly Groomed  Patent attorney::  Fair  Speech:  Clear and Coherent and Normal Rate  Volume:  Normal  Mood:  Anxious  Affect:  Congruent  Thought Process:  Linear  Orientation:  Full (Time, Place, and Person)  Thought Content:  Delusions  Suicidal Thoughts:  No  Homicidal Thoughts:  No  Memory:  Immediate;   Fair Recent;   Fair Remote;   Fair  Judgement:  Intact  Insight:  Shallow  Psychomotor Activity:  Normal  Concentration:  Fair  Recall:  [employer] of Knowledge:Fair  Language: Fair  Akathisia:  Negative  Handed:  Right  AIMS (if indicated):     Assets:  Communication Skills Desire for Improvement Financial Resources/Insurance Housing Physical Health Resilience Social Support  Sleep:  Number of Hours: 8.5  Cognition: WNL  ADL's:  Intact     Physical Exam: Physical Exam Vitals and nursing note reviewed.  Constitutional:      Appearance: Normal appearance.  HENT:     Head: Normocephalic and atraumatic.     Right Ear: External ear normal.     Left Ear: External ear normal.     Nose: Nose normal.     Mouth/Throat:     Mouth: Mucous membranes are moist.     Pharynx: Oropharynx is clear.  Eyes:     Extraocular Movements: Extraocular movements intact.     Conjunctiva/sclera: Conjunctivae normal.     Pupils: Pupils are equal, round, and reactive to light.  Cardiovascular:     Rate and Rhythm: Normal rate.     Pulses: Normal pulses.  Pulmonary:     Effort: Pulmonary effort is normal.     Breath sounds: Normal breath sounds.  Abdominal:     General: Abdomen is flat.     Palpations: Abdomen is soft.  Musculoskeletal:        General: No swelling. Normal range of motion.     Cervical back: Normal range of motion and neck  supple.  Skin:    General: Skin is warm and dry.  Neurological:     General: No focal deficit present.     Mental Status: He is alert and oriented to person, place, and time.  Psychiatric:        Mood and Affect: Mood is anxious.        Behavior: Behavior normal.        Thought Content: Thought content is delusional.        Judgment: Judgment normal.    Review of Systems  Constitutional: Negative for appetite change and fatigue.  HENT: Negative for rhinorrhea and sore throat.   Eyes: Negative for photophobia and visual disturbance.  Respiratory: Negative for cough and shortness of breath.   Cardiovascular: Negative for chest pain and palpitations.  Gastrointestinal: Negative for constipation, diarrhea, nausea and vomiting.  Endocrine: Negative for cold intolerance and heat intolerance.  Genitourinary: Negative for difficulty urinating and dysuria.  Musculoskeletal: Negative for neck pain and neck stiffness.  Skin: Negative for rash and wound.  Allergic/Immunologic: Positive for environmental allergies. Negative for food allergies.  Neurological: Negative for dizziness and speech difficulty.  Hematological: Negative for adenopathy. Does not bruise/bleed easily.  Psychiatric/Behavioral: Negative for behavioral problems, hallucinations, sleep disturbance and suicidal ideas. The patient is nervous/anxious.   Blood pressure 117/88, pulse 88, temperature 98 F (36.7 C), temperature source Oral, resp. rate 17, height 6\' 4"  (1.93 m), weight 120.8 kg, SpO2 100 %. Body mass index is 32.42 kg/m.   Have you used any form of tobacco in the last 30 days? (Cigarettes, Smokeless Tobacco, Cigars, and/or Pipes): No  Has this patient used any form of tobacco in the last 30 days? (Cigarettes, Smokeless Tobacco, Cigars, and/or Pipes) No  Blood Alcohol level:  Lab Results  Component Value Date   ETH <10 09/05/2020   ETH <10 09/01/2020    Metabolic Disorder Labs:  Lab Results  Component Value  Date   HGBA1C 4.9 07/09/2017   MPG 93.93 07/09/2017   Lab Results  Component Value Date   PROLACTIN 3.4 (L) 08/08/2016   Lab Results  Component Value Date   CHOL 164 07/09/2017   TRIG 171 (H) 07/09/2017   HDL 35 (L) 07/09/2017   CHOLHDL 4.7 07/09/2017   VLDL 34 07/09/2017   LDLCALC 95 07/09/2017   LDLCALC 105  08/08/2016    See Psychiatric Specialty Exam and Suicide Risk Assessment completed by Attending Physician prior to discharge.  Discharge destination:  Home  Is patient on multiple antipsychotic therapies at discharge:  No   Has Patient had three or more failed trials of antipsychotic monotherapy by history:  No  Recommended Plan for Multiple Antipsychotic Therapies: NA  Discharge Instructions    Diet general   Complete by: As directed    Increase activity slowly   Complete by: As directed      Allergies as of 09/22/2020      Reactions   Haldol [haloperidol] Other (See Comments)   Dystonia       Medication List    STOP taking these medications   econazole nitrate 1 % cream   sucralfate 1 GM/10ML suspension Commonly known as: CARAFATE   triamcinolone 0.1 % Commonly known as: KENALOG     TAKE these medications     Indication  amLODipine 5 MG tablet Commonly known as: NORVASC Take 1 tablet (5 mg total) by mouth daily. Start taking on: September 23, 2020  Indication: High Blood Pressure Disorder   carbamazepine 200 MG tablet Commonly known as: TEGRETOL Take 1 tablet (200 mg total) by mouth in the morning AND 2 tablets (400 mg total) at bedtime. What changed:   See the new instructions.  Another medication with the same name was removed. Continue taking this medication, and follow the directions you see here.  Indication: Depressive Phase of Manic-Depression   clonazePAM 0.5 MG tablet Commonly known as: KLONOPIN Take 1 tablet (0.5 mg total) by mouth 3 (three) times daily as needed for anxiety. What changed:   when to take this  reasons to take  this  Indication: Feeling Anxious   desvenlafaxine 100 MG 24 hr tablet Commonly known as: PRISTIQ Take 1 tablet (100 mg total) by mouth daily.  Indication: Major Depressive Disorder   gabapentin 400 MG capsule Commonly known as: NEURONTIN Take 2 capsules (800 mg total) by mouth 3 (three) times daily.  Indication: Fibromyalgia Syndrome   Multivitamin Gummies Mens Chew Chew 1 tablet by mouth daily.  Indication: nutritional support   pantoprazole 40 MG tablet Commonly known as: PROTONIX Take 1 tablet (40 mg total) by mouth daily.  Indication: Gastroesophageal Reflux Disease   polyethylene glycol powder 17 GM/SCOOP powder Commonly known as: GLYCOLAX/MIRALAX Take 255 g by mouth daily. What changed:   when to take this  reasons to take this  Indication: Constipation   ziprasidone 40 MG capsule Commonly known as: GEODON Take 1 capsule (40 mg total) by mouth daily with supper.  Indication: Delusions of Parasitosis        Follow-up recommendations:  Activity:  as tolerated Diet:  regular diet  Comments:  30-day scripts with 1 refill sent to pharmacy for patient   Signed: Jesse Sans, MD 09/22/2020, 10:28 AM

## 2020-09-22 NOTE — Progress Notes (Signed)
Pt was educated on prescriptions and follow up care. Pt questions were answered and pt verbalized understanding and did not voice any concerns. Pt's belongings that were dropped off by visitor were returned. Pt did not come down to BMU with belongings per belonging s sheet. Patient states he came to ED with belongings. ED and Medical floors were contacted to inquire about belongings that may have been left there. Pt was safely discharged to the [facility].

## 2020-09-22 NOTE — Progress Notes (Signed)
Recreation Therapy Notes  Date: 09/22/2020  Time: 9:30 am   Location: Court yard    Behavioral response: Appropriate  Intervention Topic: Social Skills    Discussion/Intervention:  Group content on today was focused on Pharmacist, community. The group defined social skills and identified ways they use social skills. Patients expressed what obstacles they face when trying to be social. Participants described the importance of social skills. The group listed ways to improve social skills and reasons to improve social skills. Individuals had an opportunity to learn new and improve social skills as well as identify their weaknesses. Clinical Observations/Feedback: Patient came to group and was focused on what peers and staff had to say. He expressed he is becomes social when he does things he enjoys. Individual was social with staff and peers while participating in the intervention. Finnbar Cedillos LRT/CTRS         Azha Constantin 09/22/2020 11:27 AM

## 2020-09-22 NOTE — Progress Notes (Signed)
Patient denies SI, HI, and AVH. He presents with a brighter affect compared to previous encounters. Patient reports good sleep and appetite. He states "I feel swelling in my head and burning in my eyes still but I am okay." Patient is more active on the unit and participated in group.  Patient remains safe on the unit at this time and q15 min safety checks are maintained.

## 2020-09-24 ENCOUNTER — Encounter: Payer: Self-pay | Admitting: Emergency Medicine

## 2020-09-24 ENCOUNTER — Emergency Department
Admission: EM | Admit: 2020-09-24 | Discharge: 2020-09-24 | Disposition: A | Discharge status: Home / Self Care | Payer: Medicare Other | Attending: Emergency Medicine | Admitting: Emergency Medicine

## 2020-09-24 ENCOUNTER — Other Ambulatory Visit: Payer: Self-pay

## 2020-09-24 DIAGNOSIS — F22 Delusional disorders: Secondary | ICD-10-CM | POA: Insufficient documentation

## 2020-09-24 DIAGNOSIS — M791 Myalgia, unspecified site: Secondary | ICD-10-CM | POA: Diagnosis present

## 2020-09-24 DIAGNOSIS — F840 Autistic disorder: Secondary | ICD-10-CM | POA: Insufficient documentation

## 2020-09-24 DIAGNOSIS — R0600 Dyspnea, unspecified: Secondary | ICD-10-CM | POA: Diagnosis not present

## 2020-09-24 DIAGNOSIS — K5900 Constipation, unspecified: Secondary | ICD-10-CM | POA: Diagnosis not present

## 2020-09-24 DIAGNOSIS — R042 Hemoptysis: Secondary | ICD-10-CM | POA: Insufficient documentation

## 2020-09-24 DIAGNOSIS — Z87891 Personal history of nicotine dependence: Secondary | ICD-10-CM | POA: Diagnosis not present

## 2020-09-24 LAB — CBC WITH DIFFERENTIAL/PLATELET
Abs Immature Granulocytes: 0.01 10*3/uL (ref 0.00–0.07)
Basophils Absolute: 0.1 10*3/uL (ref 0.0–0.1)
Basophils Relative: 1 %
Eosinophils Absolute: 0.1 10*3/uL (ref 0.0–0.5)
Eosinophils Relative: 2 %
HCT: 46.3 % (ref 39.0–52.0)
Hemoglobin: 15.2 g/dL (ref 13.0–17.0)
Immature Granulocytes: 0 %
Lymphocytes Relative: 43 %
Lymphs Abs: 1.8 10*3/uL (ref 0.7–4.0)
MCH: 29.3 pg (ref 26.0–34.0)
MCHC: 32.8 g/dL (ref 30.0–36.0)
MCV: 89.4 fL (ref 80.0–100.0)
Monocytes Absolute: 0.5 10*3/uL (ref 0.1–1.0)
Monocytes Relative: 12 %
Neutro Abs: 1.8 10*3/uL (ref 1.7–7.7)
Neutrophils Relative %: 42 %
Platelets: 318 10*3/uL (ref 150–400)
RBC: 5.18 MIL/uL (ref 4.22–5.81)
RDW: 13.7 % (ref 11.5–15.5)
WBC: 4.4 10*3/uL (ref 4.0–10.5)
nRBC: 0 % (ref 0.0–0.2)

## 2020-09-24 NOTE — ED Provider Notes (Signed)
[facility] Emergency Department Provider Note  ____________________________________________   Event Date/Time   First MD Initiated Contact with Patient 09/24/20 1154     (approximate)  I have reviewed the triage vital signs and the nursing notes.   HISTORY  Chief Complaint Radiation   HPI Connor [name] is a 20 y.o. male with a past medical history of ADHD, autism spectrum disorder, depression, neurofibromatosis, obesity, and fixed delusion of radiation poisoning recently hospitalized 3/28 4/1 for this delusion who presents for assessment of persistent "burning pain everywhere".  This is the same pain that has been going on for several months but seemed to have been no acute changes today.  Patient states he is also worried about his lungs and that he is hemorrhaging else.  States he has had difficulty breathing over the last several months because he believes he is inhaling radioactive particles.  Also states he has been having lots of blood in his sputum although few minutes later notes that he has been constipated has not had a bowel movement or any blood since being discharged.  On review of records there is no mention of any GI bleeding on patient's recent hospitalization.  He denies any headache, earache, sore throat, cough, acute chest pain, rash or recent traumatic injuries.  States he has been taking his medicines.  No clear alleviating aggravating factors.  Denies any SI or HI at this time.  No other acute concerns at this time although patient would like to get referral for hematologist as he states he believes he needs a bone marrow transplant.         Past Medical History:  Diagnosis Date  . ADHD (attention deficit hyperactivity disorder)   . Autism   . Depressed   . Neurofibromatosis (HCC)    Type 1  . Obesity   . Pertussis    as a infant    Patient Active Problem List   Diagnosis Date Noted  . Acute dystonic reaction due to  drugs 09/13/2020  . Panic disorder 08/25/2020  . 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 PTSD (post-traumatic stress disorder) 07/11/2020  . Major depressive disorder, recurrent episode (HCC) 07/11/2020  . 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 ADHD (attention deficit hyperactivity disorder) 07/08/2017  . Personality disorder (HCC) 07/08/2017  . Insomnia 06/09/2017  . Aggression 01/31/2016  . 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 Autism spectrum disorder associated with known medical or genetic condition or environmental factor, requiring substantial support (level 2) 11/06/2015  . 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 MRI    . RADIOLOGY WITH ANESTHESIA N/A 08/13/2012   Procedure: RADIOLOGY WITH ANESTHESIA;  Surgeon: Medication Radiologist, MD;  Location: [facility] OR;  Service: Radiology;  Laterality: N/A;  MRI     Prior to Admission medications   Medication Sig Start Date End Date Taking? Authorizing Provider  amLODipine (NORVASC) 5 MG tablet Take 1 tablet (5 mg total) by mouth daily. 09/23/20   Jesse Sans, MD  carbamazepine (TEGRETOL) 200 MG tablet Take 1 tablet (200 mg total) by mouth in the morning AND 2 tablets (400 mg total) at bedtime. 09/22/20   Jesse Sans, MD  clonazePAM (KLONOPIN) 0.5 MG tablet Take 1 tablet (0.5 mg total) by mouth 3 (three) times daily as needed  for anxiety. 09/22/20   Jesse Sans, MD  desvenlafaxine (PRISTIQ) 100 MG 24 hr tablet Take 1 tablet (100 mg total) by mouth daily. 09/22/20   Jesse Sans, MD  gabapentin (NEURONTIN) 400 MG capsule Take 2 capsules (800 mg total) by mouth 3 (three) times daily. 09/22/20   Jesse Sans, MD  Multiple Vitamins-Minerals (MULTIVITAMIN GUMMIES MENS) CHEW Chew 1 tablet by mouth daily.     [provider]  pantoprazole (PROTONIX) 40 MG tablet Take 1 tablet (40 mg total) by mouth daily. 09/19/20 10/19/20  Darlin Drop, DO  polyethylene glycol powder (GLYCOLAX/MIRALAX) 17 GM/SCOOP powder Take 255 g by mouth daily. Patient taking differently: Take 1 Container by mouth daily as needed for severe constipation. 07/26/20   Toney Reil, MD  ziprasidone (GEODON) 40 MG capsule Take 1 capsule (40 mg total) by mouth daily with supper. 09/22/20   Jesse Sans, MD    Allergies Haldol [haloperidol]  Family History  Problem Relation Age of Onset  . Anxiety disorder Mother   . Depression Mother   . OCD Mother   . Hypertension Mother   . Cancer Maternal Aunt   . Cancer Maternal Grandmother   . Hypertension Father   . Schizophrenia Maternal Uncle     Social History Social History   Tobacco Use  . Smoking status: Former Smoker    Packs/day: 1.00    Years: 1.00    Pack years: 1.00    Types: Cigarettes    Quit date: 12/31/2014    Years since quitting: 5.7  . Smokeless tobacco: Never Used  Substance Use Topics  . Alcohol use: No    Alcohol/week: 0.0 standard drinks  . Drug use: No    Review of Systems  Review of Systems  Constitutional: Negative for chills and fever.  HENT: Negative for sore throat.   Eyes: Negative for pain.  Respiratory: Negative for cough and stridor.   Cardiovascular: Negative for chest pain.  Gastrointestinal: Negative for vomiting.  Genitourinary: Negative for dysuria.  Musculoskeletal: Positive for myalgias.  Skin: Negative for rash.  Neurological: Negative for seizures, loss of consciousness and headaches.  Psychiatric/Behavioral: Negative for suicidal ideas.  All other systems reviewed and are negative.     ____________________________________________   PHYSICAL EXAM:  VITAL SIGNS: ED Triage Vitals  Enc Vitals Group     BP 09/24/20 1154 112/64     Pulse Rate 09/24/20 1154 (!) 107     Resp 09/24/20 1154 16     Temp  09/24/20 1154 98.7 F (37.1 C)     Temp Source 09/24/20 1154 Oral     SpO2 09/24/20 1154 97 %     Weight 09/24/20 1149 264 lb 8.8 oz (120 kg)     Height 09/24/20 1149 6\' 4"  (1.93 m)     Head Circumference --      Peak Flow --      Pain Score 09/24/20 1149 10     Pain Loc --      Pain Edu? --      Excl. in GC? --    Vitals:   09/24/20 1154  BP: 112/64  Pulse: (!) 107  Resp: 16  Temp: 98.7 F (37.1 C)  SpO2: 97%   Physical Exam Vitals and nursing note reviewed.  Constitutional:      Appearance: He is well-developed.  HENT:     Head: Normocephalic and atraumatic.     Right Ear: External ear normal.  Left Ear: External ear normal.     Nose: Nose normal.  Eyes:     Conjunctiva/sclera: Conjunctivae normal.  Cardiovascular:     Rate and Rhythm: Normal rate and regular rhythm.     Heart sounds: No murmur heard.   Pulmonary:     Effort: Pulmonary effort is normal. No respiratory distress.     Breath sounds: Normal breath sounds.  Abdominal:     Palpations: Abdomen is soft.     Tenderness: There is no abdominal tenderness.  Musculoskeletal:     Cervical back: Neck supple.  Skin:    General: Skin is warm and dry.  Neurological:     Mental Status: He is alert.  Psychiatric:        Thought Content: Thought content is delusional. Thought content does not include homicidal or suicidal ideation.        Cognition and Memory: Cognition is impaired.      ____________________________________________   LABS (all labs ordered are listed, but only abnormal results are displayed)  Labs Reviewed  CBC WITH DIFFERENTIAL/PLATELET   ____________________________________________  EKG  ____________________________________________  RADIOLOGY  ED MD interpretation:   Official radiology report(s): No results found.  ____________________________________________   PROCEDURES  Procedure(s) performed (including Critical  Care):  Procedures   ____________________________________________   INITIAL IMPRESSION / ASSESSMENT AND PLAN / ED COURSE      Patient presents with above to history exam for assessment of multiple complaints as described above which seem fairly stable from prior and close to the same complaints patient has been complaining of for several weeks with exception of new believe he has been hemorrhaging.  While at one point he endorsed lots of blood in his appropriate then states he has not had any bowel movements and states this happened while he was in the hospital upon review of records there is no record of any noted bleeding or emergent concerns.  On arrival today he is afebrile and hemodynamically stable.  He is not suicidal homicidal his delusions appear fixed and stable that he has radiation poisoning.  He denies any recent traumatic injuries and Evalose patient for acute infectious process.  Low suspicion for significant metabolic derangement.  Low suspicion for significant anemia or any significant GI bleed given patient has no clear history of this hemoglobin is at baseline.  Patient is stable for discharge with outpatient PCP and psych follow-up.  Discharged stable condition.  Strict return precautions advised and discussed.       ____________________________________________   FINAL CLINICAL IMPRESSION(S) / ED DIAGNOSES  Final diagnoses:  Myalgia    Medications - No data to display   ED Discharge Orders    None       Note:  This document was prepared using Dragon voice recognition software and may include unintentional dictation errors.   Gilles Chiquito, MD 09/24/20 [phone]

## 2020-09-24 NOTE — ED Triage Notes (Signed)
Pt states he has had a radiation exposure and needs a bone marrow transplant emergently.

## 2020-09-24 NOTE — ED Notes (Signed)
Lab drawn and sent.

## 2020-09-28 ENCOUNTER — Telehealth: Payer: Self-pay | Admitting: Gastroenterology

## 2020-09-28 NOTE — Telephone Encounter (Signed)
Patient's Mom called stated that her son is in [facility] and will not be able to have his procedure on 10/03/20.  Mom asked that we cancel his procedure.

## 2020-09-28 NOTE — Telephone Encounter (Signed)
Called Trish and cancel the procedure  

## 2020-09-29 ENCOUNTER — Other Ambulatory Visit: Admission: RE | Admit: 2020-09-29 | Payer: Medicaid Other | Source: Ambulatory Visit

## 2020-10-03 ENCOUNTER — Ambulatory Visit: Admission: RE | Admit: 2020-10-03 | Payer: Medicaid Other | Source: Home / Self Care | Admitting: Gastroenterology

## 2020-10-03 ENCOUNTER — Encounter: Admission: RE | Payer: Self-pay | Source: Home / Self Care

## 2020-10-03 SURGERY — COLONOSCOPY WITH PROPOFOL
Anesthesia: General

## 2020-10-10 ENCOUNTER — Ambulatory Visit: Payer: Medicaid Other | Admitting: Gastroenterology

## 2021-02-19 ENCOUNTER — Other Ambulatory Visit: Payer: Self-pay

## 2021-02-19 ENCOUNTER — Emergency Department
Admission: EM | Admit: 2021-02-19 | Discharge: 2021-02-20 | Disposition: A | Discharge status: Home / Self Care | Payer: Medicare Other | Attending: Emergency Medicine | Admitting: Emergency Medicine

## 2021-02-19 DIAGNOSIS — R45851 Suicidal ideations: Secondary | ICD-10-CM | POA: Diagnosis not present

## 2021-02-19 DIAGNOSIS — S51812A Laceration without foreign body of left forearm, initial encounter: Secondary | ICD-10-CM | POA: Insufficient documentation

## 2021-02-19 DIAGNOSIS — F332 Major depressive disorder, recurrent severe without psychotic features: Secondary | ICD-10-CM | POA: Diagnosis not present

## 2021-02-19 DIAGNOSIS — F451 Undifferentiated somatoform disorder: Secondary | ICD-10-CM | POA: Diagnosis present

## 2021-02-19 DIAGNOSIS — Z79899 Other long term (current) drug therapy: Secondary | ICD-10-CM | POA: Diagnosis not present

## 2021-02-19 DIAGNOSIS — Z87891 Personal history of nicotine dependence: Secondary | ICD-10-CM | POA: Diagnosis not present

## 2021-02-19 DIAGNOSIS — X789XXA Intentional self-harm by unspecified sharp object, initial encounter: Secondary | ICD-10-CM

## 2021-02-19 DIAGNOSIS — S61519A Laceration without foreign body of unspecified wrist, initial encounter: Secondary | ICD-10-CM

## 2021-02-19 DIAGNOSIS — F4310 Post-traumatic stress disorder, unspecified: Secondary | ICD-10-CM | POA: Diagnosis present

## 2021-02-19 DIAGNOSIS — S59912A Unspecified injury of left forearm, initial encounter: Secondary | ICD-10-CM | POA: Diagnosis present

## 2021-02-19 DIAGNOSIS — F840 Autistic disorder: Secondary | ICD-10-CM | POA: Diagnosis present

## 2021-02-19 DIAGNOSIS — F609 Personality disorder, unspecified: Secondary | ICD-10-CM | POA: Diagnosis present

## 2021-02-19 DIAGNOSIS — Z20822 Contact with and (suspected) exposure to covid-19: Secondary | ICD-10-CM | POA: Insufficient documentation

## 2021-02-19 DIAGNOSIS — X838XXA Intentional self-harm by other specified means, initial encounter: Secondary | ICD-10-CM | POA: Insufficient documentation

## 2021-02-19 DIAGNOSIS — G4700 Insomnia, unspecified: Secondary | ICD-10-CM | POA: Diagnosis present

## 2021-02-19 DIAGNOSIS — R4689 Other symptoms and signs involving appearance and behavior: Secondary | ICD-10-CM | POA: Diagnosis present

## 2021-02-19 DIAGNOSIS — F339 Major depressive disorder, recurrent, unspecified: Secondary | ICD-10-CM | POA: Diagnosis present

## 2021-02-19 DIAGNOSIS — F909 Attention-deficit hyperactivity disorder, unspecified type: Secondary | ICD-10-CM | POA: Diagnosis present

## 2021-02-19 DIAGNOSIS — F39 Unspecified mood [affective] disorder: Secondary | ICD-10-CM | POA: Diagnosis present

## 2021-02-19 DIAGNOSIS — F4325 Adjustment disorder with mixed disturbance of emotions and conduct: Secondary | ICD-10-CM | POA: Diagnosis present

## 2021-02-19 LAB — URINE DRUG SCREEN, QUALITATIVE (ARMC ONLY)
Amphetamines, Ur Screen: NOT DETECTED
Barbiturates, Ur Screen: NOT DETECTED
Benzodiazepine, Ur Scrn: NOT DETECTED
Cannabinoid 50 Ng, Ur ~~LOC~~: NOT DETECTED
Cocaine Metabolite,Ur ~~LOC~~: NOT DETECTED
MDMA (Ecstasy)Ur Screen: NOT DETECTED
Methadone Scn, Ur: NOT DETECTED
Opiate, Ur Screen: NOT DETECTED
Phencyclidine (PCP) Ur S: NOT DETECTED
Tricyclic, Ur Screen: NOT DETECTED

## 2021-02-19 LAB — COMPREHENSIVE METABOLIC PANEL
ALT: 37 U/L (ref 0–44)
AST: 26 U/L (ref 15–41)
Albumin: 4.5 g/dL (ref 3.5–5.0)
Alkaline Phosphatase: 66 U/L (ref 38–126)
Anion gap: 9 (ref 5–15)
BUN: 13 mg/dL (ref 6–20)
CO2: 27 mmol/L (ref 22–32)
Calcium: 9.6 mg/dL (ref 8.9–10.3)
Chloride: 102 mmol/L (ref 98–111)
Creatinine, Ser: 0.76 mg/dL (ref 0.61–1.24)
GFR, Estimated: >60 mL/min (ref >60)
Glucose, Bld: 97 mg/dL (ref 70–99)
Potassium: 3.8 mmol/L (ref 3.5–5.1)
Sodium: 138 mmol/L (ref 135–145)
Total Bilirubin: 0.5 mg/dL (ref 0.3–1.2)
Total Protein: 7.8 g/dL (ref 6.5–8.1)

## 2021-02-19 LAB — CBC
HCT: 48.1 % (ref 39.0–52.0)
Hemoglobin: 16.5 g/dL (ref 13.0–17.0)
MCH: 29.6 pg (ref 26.0–34.0)
MCHC: 34.3 g/dL (ref 30.0–36.0)
MCV: 86.2 fL (ref 80.0–100.0)
Platelets: 311 10*3/uL (ref 150–400)
RBC: 5.58 MIL/uL (ref 4.22–5.81)
RDW: 13 % (ref 11.5–15.5)
WBC: 8.2 10*3/uL (ref 4.0–10.5)
nRBC: 0 % (ref 0.0–0.2)

## 2021-02-19 LAB — ETHANOL: Alcohol, Ethyl (B): <10 mg/dL (ref <10)

## 2021-02-19 LAB — SALICYLATE LEVEL: Salicylate Lvl: <7 mg/dL — ABNORMAL LOW (ref 7.0–30.0)

## 2021-02-19 LAB — ACETAMINOPHEN LEVEL: Acetaminophen (Tylenol), Serum: <10 ug/mL — ABNORMAL LOW (ref 10–30)

## 2021-02-19 MED ORDER — BACITRACIN ZINC 500 UNIT/GM EX OINT
TOPICAL_OINTMENT | Freq: Once | CUTANEOUS | Status: AC
  Filled 2021-02-19: qty 0.9

## 2021-02-19 NOTE — ED Notes (Signed)
Pt here via EMs from home with c/o SI and HI. Pt has laceration to left forearm bandage in place unknown depth. VSS per EMS

## 2021-02-19 NOTE — ED Notes (Signed)
VOL  PENDING  PLACEMENT 

## 2021-02-19 NOTE — ED Triage Notes (Addendum)
Pt dressed out into hospital attire gown. Pt unable to fit any other of hospital scrubs. Belongings to include: 2 black slides 1 red shirt 1 gray shorts that pt is going to keep on due to no clothes to fit patient.  This note typed by Sam RN, Director aware of clothes situation and this RN informed security and Education officer, community. Pt to be placed in close view of staff.

## 2021-02-19 NOTE — Consult Note (Signed)
[facility] Face-to-Face Psychiatry Consult   Reason for Consult: Suicidal Referring Physician: Dr. Katrinka Blazing Patient Identification: Connor [name] MRN:  [id number] Principal Diagnosis: <principal problem not specified> Diagnosis:  Active Problems:   Aggression   Episodic mood disorder (HCC)   Autism spectrum disorder associated with known medical or genetic condition or environmental factor, requiring substantial support (level 2)   Insomnia   ADHD (attention deficit hyperactivity disorder)   Personality disorder (HCC)   Adjustment disorder with mixed disturbance of emotions and conduct   PTSD (post-traumatic stress disorder)   Major depressive disorder, recurrent episode (HCC)   Somatic symptom disorder   Total Time spent with patient: 1 hour  Subjective: " I feel like I am not getting better." Connor [name] is a 20 y.o. male patient presented to [facility] ED via EMS voluntarily due to him getting into an argument with his mom today, getting a "vegetable knife," and cutting his forearm. The patient has a laceration to the left forearm bandage of an unknown depth. The patient is from home with voicing SI/HI initially.  The patient discussed that he had started ECT a few weeks ago and had many doctor's appointments last week. He discussed that he had an appointment today. He stated he called to cancel the appointment and re-schedule it for next Friday. He noted that his mom said she would not be able to take him to his scheduled appointment and could only take him in two weeks and on a Friday. The patient shared that his mom scheduled that appointment for 0800 and knew he would not want to wake up that early. He said he told her to call the clinic and change the time. He discussed he became angry. The patient stated he threatened his mom, and they argued. He said he grabbed the knife and cut his forearm. The patient discussed that "I want a way out. I want to get away from my mom. I  feel she is smothering me, and I become angry at times. I think I overreacted today, and I wanted to tell her I was sorry." The patient states, "I want to get better. I don't think anything is helping me." He says He does not thinks anyone loves him.   The patient was seen face-to-face by this provider; the chart was reviewed and consulted with Dr. Katrinka Blazing on 02/19/2021 due to the patient's care. It was discussed with the EDP that the patient will remain under observation overnight and will be reassessed in the a.m. to determine if he meets the criteria for psychiatric inpatient admission; he could be discharged home.  On evaluation, the patient is alert and oriented x 4, calm, cooperative, and mood-congruent with affect. The patient does not appear to be responding to internal or external stimuli. The patient is not presenting with any delusional thinking. The patient denies auditory or visual hallucinations. The patient admits to suicidal ideations but denies homicidal ideations.    Plans: The patient will remain under observation overnight and be reassessed in the a.m. to determine if he meets the criteria for psychiatric inpatient admission; he could be discharged home.  HPI: Per Dr. Katrinka Blazing, Connor [name] is a 20 y.o. Connor [name] presents to the ED for evaluation of suicidal ideations.   Chart review indicates morbid obesity, neurofibromatosis type I, depression and ADHD.  Fixed delusional disorder about radiation poisoning who has been seen recurrently in our ED for this.   Patient presents to the ED for evaluation  of self cutting behavior to his left armIn the setting of a verbal disagreement with his mother.  Patient reports he lives at home with his mom and reports frustration with her and having had multiple medical appointments, such as getting ECTs and seeing GI.   He reports no longer has thoughts of harming himself, denies any lingering plans for self-harm.  Denies audiovisual  hallucinations.  He reports that he "just needed to get away," when I asked why he cut his left wrist.   He denies any recent illnesses, fevers, syncopal episodes or falls.  Past Psychiatric History:   ADHD (attention deficit hyperactivity disorder)    Autism   Depressed   Risk to Self:   Risk to Others:   Prior Inpatient Therapy:   Prior Outpatient Therapy:    Past Medical History:  Past Medical History:  Diagnosis Date   ADHD (attention deficit hyperactivity disorder)    Autism    Depressed    Neurofibromatosis (HCC)    Type 1   Obesity    Pertussis    as a infant    Past Surgical History:  Procedure Laterality Date   MRI     RADIOLOGY WITH ANESTHESIA N/A 08/13/2012   Procedure: RADIOLOGY WITH ANESTHESIA;  Surgeon: Medication Radiologist, MD;  Location: [facility] OR;  Service: Radiology;  Laterality: N/A;  MRI    Family History:  Family History  Problem Relation Age of Onset   Anxiety disorder Mother    Depression Mother    OCD Mother    Hypertension Mother    Cancer Maternal Aunt    Cancer Maternal Grandmother    Hypertension Father    Schizophrenia Maternal Uncle    Family Psychiatric  History:  Social History:  Social History   Substance and Sexual Activity  Alcohol Use No   Alcohol/week: 0.0 standard drinks     Social History   Substance and Sexual Activity  Drug Use No    Social History   Socioeconomic History   Marital status: Single    Spouse name: Not on file   Number of children: Not on file   Years of education: Not on file   Highest education level: Not on file  Occupational History   Not on file  Tobacco Use   Smoking status: Former    Packs/day: 1.00    Years: 1.00    Pack years: 1.00    Types: Cigarettes    Quit date: 12/31/2014    Years since quitting: 6.1   Smokeless tobacco: Never  Substance and Sexual Activity   Alcohol use: No    Alcohol/week: 0.0 standard drinks   Drug use: No   Sexual activity: Not Currently    Birth  control/protection: None  Other Topics Concern   Not on file  Social History Narrative   Jhordan is a high school drop out.   He lives with his mom only. He has one sister.   He enjoys eating, sleeping, and watching tv.   Social Determinants of Health   Financial Resource Strain: Not on file  Food Insecurity: Not on file  Transportation Needs: Not on file  Physical Activity: Not on file  Stress: Not on file  Social Connections: Not on file   Additional Social History:    Allergies:   Allergies  Allergen Reactions   Haldol [Haloperidol] Other (See Comments)    Dystonia      Labs:  Results for orders placed or performed during the hospital encounter of  02/19/21 (from the past 48 hour(s))  Urine Drug Screen, Qualitative     Status: None   Collection Time: 02/19/21  3:40 PM  Result Value Ref Range   Tricyclic, Ur Screen NONE DETECTED NONE DETECTED   Amphetamines, Ur Screen NONE DETECTED NONE DETECTED   MDMA (Ecstasy)Ur Screen NONE DETECTED NONE DETECTED   Cocaine Metabolite,Ur [place] NONE DETECTED NONE DETECTED   Opiate, Ur Screen NONE DETECTED NONE DETECTED   Phencyclidine (PCP) Ur S NONE DETECTED NONE DETECTED   Cannabinoid 50 Ng, Ur [place] NONE DETECTED NONE DETECTED   Barbiturates, Ur Screen NONE DETECTED NONE DETECTED   Benzodiazepine, Ur Scrn NONE DETECTED NONE DETECTED   Methadone Scn, Ur NONE DETECTED NONE DETECTED    Comment: (NOTE) Tricyclics + metabolites, urine    Cutoff 1000 ng/mL Amphetamines + metabolites, urine  Cutoff 1000 ng/mL MDMA (Ecstasy), urine              Cutoff 500 ng/mL Cocaine Metabolite, urine          Cutoff 300 ng/mL Opiate + metabolites, urine        Cutoff 300 ng/mL Phencyclidine (PCP), urine         Cutoff 25 ng/mL Cannabinoid, urine                 Cutoff 50 ng/mL Barbiturates + metabolites, urine  Cutoff 200 ng/mL Benzodiazepine, urine              Cutoff 200 ng/mL Methadone, urine                   Cutoff 300 ng/mL  The urine drug screen  provides only a preliminary, unconfirmed analytical test result and should not be used for non-medical purposes. Clinical consideration and professional judgment should be applied to any positive drug screen result due to possible interfering substances. A more specific alternate chemical method must be used in order to obtain a confirmed analytical result. Gas chromatography / mass spectrometry (GC/MS) is the preferred confirm atory method. Performed at [facility], [address] [place], [place] [address]   Comprehensive metabolic panel     Status: None   Collection Time: 02/19/21  3:53 PM  Result Value Ref Range   Sodium 138 135 - 145 mmol/L   Potassium 3.8 3.5 - 5.1 mmol/L   Chloride 102 98 - 111 mmol/L   CO2 27 22 - 32 mmol/L   Glucose, Bld 97 70 - 99 mg/dL    Comment: Glucose reference range applies only to samples taken after fasting for at least 8 hours.   BUN 13 6 - 20 mg/dL   Creatinine, Ser 2.950.76 0.61 - 1.24 mg/dL   Calcium 9.6 8.9 - 62.110.3 mg/dL   Total Protein 7.8 6.5 - 8.1 g/dL   Albumin 4.5 3.5 - 5.0 g/dL   AST 26 15 - 41 U/L   ALT 37 0 - 44 U/L   Alkaline Phosphatase 66 38 - 126 U/L   Total Bilirubin 0.5 0.3 - 1.2 mg/dL   GFR, Estimated >30>60 >86>60 mL/min    Comment: (NOTE) Calculated using the CKD-EPI Creatinine Equation (2021)    Anion gap 9 5 - 15    Comment: Performed at [facility], [address] [place], [place] [address]  Ethanol     Status: None   Collection Time: 02/19/21  3:53 PM  Result Value Ref Range   Alcohol, Ethyl (B) <10 <10 mg/dL    Comment: (NOTE) Lowest detectable  limit for serum alcohol is 10 mg/dL.  For medical purposes only. Performed at [facility], [address] [place], [place] [address]   Salicylate level     Status: Abnormal   Collection Time: 02/19/21  3:53 PM  Result Value Ref Range   Salicylate Lvl <7.0 (L) 7.0 - 30.0 mg/dL    Comment: Performed at [facility], [address] [place], [place] [address]  Acetaminophen level     Status: Abnormal   Collection Time: 02/19/21  3:53 PM  Result Value Ref Range   Acetaminophen (Tylenol), Serum <10 (L) 10 - 30 ug/mL    Comment: (NOTE) Therapeutic concentrations vary significantly. A range of 10-30 ug/mL  may be an effective concentration for many patients. However, some  are best treated at concentrations outside of this range. Acetaminophen concentrations >150 ug/mL at 4 hours after ingestion  and >50 ug/mL at 12 hours after ingestion are often associated with  toxic reactions.  Performed at [facility], [address] [place], [place] [address]   cbc     Status: None   Collection Time: 02/19/21  3:53 PM  Result Value Ref Range   WBC 8.2 4.0 - 10.5 K/uL   RBC 5.58 4.22 - 5.81 MIL/uL   Hemoglobin 16.5 13.0 - 17.0 g/dL   HCT 91.4 78.2 - 95.6 %   MCV 86.2 80.0 - 100.0 fL   MCH 29.6 26.0 - 34.0 pg   MCHC 34.3 30.0 - 36.0 g/dL   RDW 21.3 08.6 - 57.8 %   Platelets 311 150 - 400 K/uL   nRBC 0.0 0.0 - 0.2 %    Comment: Performed at [facility], [address] [place], [place] [address]    No current facility-administered medications for this encounter.   Current Outpatient Medications  Medication Sig Dispense Refill   amLODipine (NORVASC) 5 MG tablet Take 1 tablet (5 mg total) by mouth daily. 30 tablet 1   carbamazepine (TEGRETOL) 200 MG tablet Take 1 tablet (200 mg total) by mouth in the morning AND 2 tablets (400 mg total) at bedtime. 90 tablet 1   chlorhexidine (PERIDEX) 0.12 % solution SMARTSIG:By Mouth     clonazePAM (KLONOPIN) 0.5 MG tablet Take 1 tablet (0.5 mg total) by mouth 3 (three) times daily as needed for anxiety. 90 tablet 1   desvenlafaxine (PRISTIQ) 100 MG 24 hr tablet Take 1 tablet (100 mg total) by mouth daily. 30 tablet 1   gabapentin (NEURONTIN) 400 MG capsule Take 2 capsules (800 mg total) by mouth 3 (three) times daily. 180 capsule 1   Multiple Vitamins-Minerals  (MULTIVITAMIN GUMMIES MENS) CHEW Chew 1 tablet by mouth daily.     pantoprazole (PROTONIX) 40 MG tablet Take 1 tablet (40 mg total) by mouth daily. 30 tablet 0   polyethylene glycol powder (GLYCOLAX/MIRALAX) 17 GM/SCOOP powder Take 255 g by mouth daily. (Patient taking differently: Take 1 Container by mouth daily as needed for severe constipation.) 255 g 3   ziprasidone (GEODON) 40 MG capsule Take 1 capsule (40 mg total) by mouth daily with supper. 30 capsule 1    Musculoskeletal: Strength & Muscle Tone: within normal limits Gait & Station: normal Patient leans: N/A  Psychiatric Specialty Exam:  Presentation  General Appearance: Fairly Groomed; Appropriate for Environment  Eye Contact:Good  Speech:Clear and Coherent  Speech Volume:Normal  Handedness:Right   Mood and Affect  Mood:Anxious; Depressed  Affect:Congruent   Thought Process  Thought Processes:Coherent; Goal Directed  Descriptions  of Associations:Circumstantial  Orientation:Full (Time, Place and Person)  Thought Content:Logical  History of Schizophrenia/Schizoaffective disorder:No  Duration of Psychotic Symptoms:Less than six months  Hallucinations:Hallucinations: None  Ideas of Reference:None  Suicidal Thoughts:Suicidal Thoughts: Yes, Passive SI Passive Intent and/or Plan: Without Intent; With Plan  Homicidal Thoughts:Homicidal Thoughts: No   Sensorium  Memory:Immediate Good; Recent Good; Remote Good  Judgment:Fair  Insight:Fair   Executive Functions  Concentration:Good  Attention Span:Good  Recall:Good  Fund of Knowledge:Good  Language:Good   Psychomotor Activity  Psychomotor Activity:Psychomotor Activity: Normal   Assets  Assets:Desire for Improvement; Resilience; Social Support; Housing   Sleep  Sleep:Sleep: Fair   Physical Exam: Physical Exam Vitals and nursing note reviewed.  Constitutional:      Appearance: He is obese.  HENT:     Head: Normocephalic and  atraumatic.     Nose: Nose normal.     Mouth/Throat:     Mouth: Mucous membranes are moist.  Cardiovascular:     Rate and Rhythm: Normal rate.     Pulses: Normal pulses.  Pulmonary:     Effort: Pulmonary effort is normal.  Musculoskeletal:        General: Normal range of motion.     Cervical back: Normal range of motion and neck supple.  Neurological:     General: No focal deficit present.     Mental Status: He is alert and oriented to person, place, and time. Mental status is at baseline.  Psychiatric:        Attention and Perception: Attention and perception normal.        Mood and Affect: Mood is depressed. Affect is blunt and flat.        Speech: Speech is delayed.        Behavior: Behavior normal. Behavior is cooperative.        Thought Content: Thought content includes suicidal ideation.        Cognition and Memory: Cognition normal.        Judgment: Judgment is impulsive and inappropriate.   Review of Systems  Psychiatric/Behavioral:  Positive for depression and suicidal ideas. The patient is nervous/anxious and has insomnia.   All other systems reviewed and are negative. Blood pressure 139/87, pulse 70, temperature 98.5 F (36.9 C), temperature source Oral, resp. rate 18, height 6\' 2"  (1.88 m), weight (!) 158.8 kg, SpO2 93 %. Body mass index is 44.94 kg/m.  Treatment Plan Summary: Daily contact with patient to assess and evaluate symptoms and progress in treatment, Medication management, and Plan The patient remained under observation overnight and will be reassessed in the a.m. to determine if he meets the criteria for psychiatric inpatient admission; he could be discharged home.  Disposition: Supportive therapy provided about ongoing stressors. The patient remained under observation overnight and will be reassessed in the a.m. to determine if he meets the criteria for psychiatric inpatient admission; he could be discharged home.  , NP 02/19/2021 10:52  PM

## 2021-02-19 NOTE — ED Triage Notes (Signed)
Pt to ED for SI, has cut to left forearm that pt did himself today in attempt to harm. Endorses SI. Denies HI States wish had gun to blow off his head

## 2021-02-19 NOTE — ED Provider Notes (Signed)
[facility] Emergency Department Provider Note ____________________________________________   Event Date/Time   First MD Initiated Contact with Patient 02/19/21 1628     (approximate)  I have reviewed the triage vital signs and the nursing notes.  HISTORY  Chief Complaint Suicidal   HPI Connor [name] is a 20 y.o. Connor [name] presents to the ED for evaluation of suicidal ideations.  Chart review indicates morbid obesity, neurofibromatosis type I, depression and ADHD.  Fixed delusional disorder about radiation poisoning who has been seen recurrently in our ED for this.  Patient presents to the ED for evaluation of self cutting behavior to his left armIn the setting of a verbal disagreement with his mother.  Patient reports he lives at home with his mom and reports frustration with her and having had multiple medical appointments, such as getting ECTs and seeing GI.  He reports no longer has thoughts of harming himself, denies any lingering plans for self-harm.  Denies audiovisual hallucinations.  He reports that he "just needed to get away," when I asked why he cut his left wrist.  He denies any recent illnesses, fevers, syncopal episodes or falls.  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/2017   ADHD (attention deficit hyperactivity disorder) 07/08/2017   Personality disorder (HCC) 07/08/2017    Insomnia 06/09/2017   Aggression 01/31/2016   Episodic mood disorder (HCC) 01/31/2016   Autism spectrum disorder associated with known medical or genetic condition or environmental factor, requiring substantial support (level 2) 11/06/2015   Neurofibromatosis, type I (von Recklinghausen's disease) (HCC) 11/06/2015   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 Authorizing Provider  amLODipine (NORVASC) 5 MG tablet Take 1 tablet (5 mg total) by mouth daily. 09/23/20   Jesse Sans, MD  carbamazepine (TEGRETOL) 200 MG tablet Take 1 tablet (200 mg total) by mouth in the morning AND 2 tablets (400 mg total) at bedtime. 09/22/20   Jesse Sans, MD  clonazePAM (KLONOPIN) 0.5 MG tablet Take 1 tablet (0.5 mg total) by mouth 3 (three) times daily as needed for anxiety. 09/22/20   Jesse Sans, MD  desvenlafaxine (PRISTIQ) 100 MG 24 hr tablet Take 1 tablet (100 mg total) by mouth daily. 09/22/20   Jesse Sans, MD  gabapentin (NEURONTIN) 400 MG capsule Take 2 capsules (800 mg total) by mouth 3 (three) times daily. 09/22/20   Jesse Sans, MD  Multiple Vitamins-Minerals (MULTIVITAMIN GUMMIES MENS) CHEW Chew 1 tablet by mouth daily.    [provider]  pantoprazole (PROTONIX) 40 MG  tablet Take 1 tablet (40 mg total) by mouth daily. 09/19/20 10/19/20  Darlin Drop, DO  polyethylene glycol powder (GLYCOLAX/MIRALAX) 17 GM/SCOOP powder Take 255 g by mouth daily. Patient taking differently: Take 1 Container by mouth daily as needed for severe constipation. 07/26/20   Toney Reil, MD  ziprasidone (GEODON) 40 MG capsule Take 1 capsule (40 mg total) by mouth  daily with supper. 09/22/20   Jesse Sans, MD    Allergies Haldol [haloperidol]  Family History  Problem Relation Age of Onset   Anxiety disorder Mother    Depression Mother    OCD Mother    Hypertension Mother    Cancer Maternal Aunt    Cancer Maternal Grandmother    Hypertension Father    Schizophrenia Maternal Uncle     Social History Social History   Tobacco Use   Smoking status: Former    Packs/day: 1.00    Years: 1.00    Pack years: 1.00    Types: Cigarettes    Quit date: 12/31/2014    Years since quitting: 6.1   Smokeless tobacco: Never  Substance Use Topics   Alcohol use: No    Alcohol/week: 0.0 standard drinks   Drug use: No    Review of Systems  Constitutional: No fever/chills Eyes: No visual changes. ENT: No sore throat. Cardiovascular: Denies chest pain. Respiratory: Denies shortness of breath. Gastrointestinal: No abdominal pain.  No nausea, no vomiting.  No diarrhea.  No constipation. Genitourinary: Negative for dysuria. Musculoskeletal: Negative for back pain. Skin: Negative for rash. Neurological: Negative for headaches, focal weakness or numbness.  ____________________________________________   PHYSICAL EXAM:  VITAL SIGNS: Vitals:   02/19/21 1550  BP: (!) 161/101  Pulse: (!) 114  Resp: 20  Temp: 99.2 F (37.3 C)  SpO2: 96%      Constitutional: Alert and oriented. Well appearing and in no acute distress. Eyes: Conjunctivae are normal. PERRL. EOMI. Head: Atraumatic. Nose: No congestion/rhinnorhea. Mouth/Throat: Mucous membranes are moist.  Oropharynx non-erythematous. Neck: No stridor. No cervical spine tenderness to palpation. Cardiovascular: Normal rate (triage tachycardia is noted and seems to have resolved in the time that I see the patient)., regular rhythm. Grossly normal heart sounds.  Good peripheral circulation. Respiratory: Normal respiratory effort.  No retractions. Lungs CTAB. Gastrointestinal: Soft , nondistended,  nontender to palpation. No CVA tenderness. Musculoskeletal: No lower extremity tenderness nor edema.  No joint effusions.  Couple superficial transverse lacerations to the mid volar left forearm.  No indications for suture repair.  Hemostatic.  Left arm is distally neurovascularly intact. Neurologic:  Normal speech and language. No gross focal neurologic deficits are appreciated. No gait instability noted. Skin:  Skin is warm, dry and intact. No rash noted. Psychiatric: Mood and affect are flat. Speech and behavior are normal.  ____________________________________________   LABS (all labs ordered are listed, but only abnormal results are displayed)  Labs Reviewed  SALICYLATE LEVEL - Abnormal; Notable for the following components:      Result Value   Salicylate Lvl <7.0 (*)    All other components within normal limits  ACETAMINOPHEN LEVEL - Abnormal; Notable for the following components:   Acetaminophen (Tylenol), Serum <10 (*)    All other components within normal limits  COMPREHENSIVE METABOLIC PANEL  ETHANOL  CBC  URINE DRUG SCREEN, QUALITATIVE ([facility] ONLY)   ____________________________________________  12 Lead EKG   ____________________________________________  RADIOLOGY  ED MD interpretation:    Official radiology report(s): No results found.  ____________________________________________  PROCEDURES and INTERVENTIONS  Procedure(s) performed (including Critical Care):  Procedures  Medications - No data to display  ____________________________________________   MDM / ED COURSE   20 year old male with history of depression presents to the ED with self cutting behavior to his left wrist requiring psychiatric evaluation.  He reports resolution of his suicidal ideations and has no lingering plans to harm himself.  I see no evidence of medical pathology to preclude psychiatric evaluation and disposition.  No current occasions to IVC.  We will provide bacitracin  ointment to his arm and discuss with psychiatry for evaluation.   Clinical Course as of 02/19/21 1845  Mon Feb 19, 2021  [id number] The patient has been placed in psychiatric observation due to the need to provide a safe environment for the patient while obtaining psychiatric consultation and evaluation, as well as ongoing medical and medication management to treat the patient's condition.  The patient has not been placed under full IVC at this time.   [DS]    Clinical Course User Index [DS] Delton Prairie, MD    ____________________________________________   FINAL CLINICAL IMPRESSION(S) / ED DIAGNOSES  Final diagnoses:  Suicidal ideation  Self-cutting of wrist [facility])     ED Discharge Orders     None        Alysen Smylie   Note:  This document was prepared using Dragon voice recognition software and may include unintentional dictation errors.    Delton Prairie, MD 02/19/21 [phone]

## 2021-02-19 NOTE — BH Assessment (Signed)
Comprehensive Clinical Assessment (CCA) Note  02/19/2021 Connor [name] [id number]  Chief Complaint: Patient is a 20 year old male presenting to [facility] ED voluntarily. Per triage note Pt to ED for SI, has cut to left forearm that pt did himself today in attempt to harm. Endorses SI. Denies HI.States wish had gun to blow off his head. During assessment patient appears alert and oriented x4, calm and cooperative, mood appears depressed. Patient reports "I tried to cuty myself with a vegetable knife I just wanted a way out from my mom." Patient reports that he and his mother got into a disagreement which made him upset and ultimately the reason why he hurt himself. Patient reports that "I just feel like nobody cares about me." Patient reports that he is currently engaged with ECT at [facility] but recently missed his appointment, patient also reports that he has a psychiatrist and a primary care doctor that he goes to see. Patient is currently living at home with his mother and also has the support of his father who recently took him to a [employer] football game. Patient also reports some weight gain that has contributed to his mood. Patient continues to report SI but also understands that "I might have over reacted today." Patient denies HI/AH/VH and does not appear to be responding to any internal or external stimuli.  Per Psyc NP Elenore Paddy patient to be observed overnight and reassessed  Chief Complaint  Patient presents with   Suicidal   Visit Diagnosis: Major Depressive Disorder    CCA Screening, Triage and Referral (STR)  Patient Reported Information How did you hear about [place]? Self  Referral name: self  Referral phone number: No data recorded  Whom do you see for routine medical problems? I don't have a doctor  Practice/Facility Name: No data recorded Practice/Facility Phone Number: No data recorded Name of Contact: Gean Maidens Number:  [phone]  Contact Fax Number: No data recorded Prescriber Name: No data recorded Prescriber Address (if known): No data recorded  What Is the Reason for Your Visit/Call Today? Patient presents voluntarily due to depression and SI  How Long Has This Been Causing You Problems? > than 6 months  What Do You Feel Would Help You the Most Today? Treatment for Depression or other mood problem   Have You Recently Been in Any Inpatient Treatment (Hospital/Detox/Crisis Center/28-Day Program)? No  Name/Location of Program/Hospital:No data recorded How Long Were You There? No data recorded When Were You Discharged? No data recorded  Have You Ever Received Services From [facility] Before? Yes  Who Do You See at [facility]? ED visits/Medical/Psychiatric   Have You Recently Had Any Thoughts About Hurting Yourself? Yes  Are You Planning to Commit Suicide/Harm Yourself At This time? No   Have you Recently Had Thoughts About Hurting Someone [name]? No  Explanation: No data recorded  Have You Used Any Alcohol or Drugs in the Past 24 Hours? No  How Long Ago Did You Use Drugs or Alcohol? No data recorded What Did You Use and How Much? No data recorded  Do You Currently Have a Therapist/Psychiatrist? Yes  Name of Therapist/Psychiatrist: [facility]   Have You Been Recently Discharged From Any Office Practice or Programs? No  Explanation of Discharge From Practice/Program: No data recorded    CCA Screening Triage Referral Assessment Type of Contact: Face-to-Face  Is this Initial or Reassessment? Initial Assessment  Date Telepsych consult ordered in [facility]:  09/12/20  Time  Telepsych consult ordered in [facility]:  1335   Patient Reported Information Reviewed? Yes  Patient Left Without Being Seen? No data recorded Reason for Not Completing Assessment: No data recorded  Collateral Involvement: Pt consented for his mother to be updated only as needed- Carolos Fecher  [phone]   Does Patient Have a Court Appointed Legal Guardian? No data recorded Name and Contact of Legal Guardian: No data recorded If Minor and Not Living with Parent(s), Who has Custody? n/a  Is CPS involved or ever been involved? Never  Is APS involved or ever been involved? Never   Patient Determined To Be At Risk for Harm To Self or Others Based on Review of Patient Reported Information or Presenting Complaint? No  Method: No data recorded Availability of Means: No data recorded Intent: No data recorded Notification Required: No data recorded Additional Information for Danger to Others Potential: No data recorded Additional Comments for Danger to Others Potential: No data recorded Are There Guns or Other Weapons in Your Home? No data recorded Types of Guns/Weapons: No data recorded Are These Weapons Safely Secured?                            No data recorded Who Could Verify You Are Able To Have These Secured: No data recorded Do You Have any Outstanding Charges, Pending Court Dates, Parole/Probation? No data recorded Contacted To Inform of Risk of Harm To Self or Others: No data recorded  Location of Assessment: [facility] ED   Does Patient Present under Involuntary Commitment? No  IVC Papers Initial File Date: 09/12/20   [place] of Residence: [place]   Patient Currently Receiving the Following Services: Medication Management; ECT   Determination of Need: Emergent (2 hours)   Options For Referral: Inpatient Hospitalization; Medication Management; Outpatient Therapy     CCA Biopsychosocial Intake/Chief Complaint:  Pt reports exposure to radation and the need to recover from the poision through a Bone marrow transplant  Current Symptoms/Problems: skin buring, burn sores all over body (not visible to TTS), darkening skin color and increased stress with some irritability   Patient Reported Schizophrenia/Schizoaffective Diagnosis in Past: No   Strengths:  Patient is able to communicate  Preferences: Pt prefers no INPT  Abilities: pt is able to care for himself   Type of Services Patient Feels are Needed: pt reports to need medical attention for amino supplements and bone marrow transplant   Initial Clinical Notes/Concerns: Pt presents paroanid, delusional and manic at this time   Mental Health Symptoms Depression:   Change in energy/activity; Hopelessness; Worthlessness; Weight gain/loss   Duration of Depressive symptoms:  Greater than two weeks   Mania:   None   Anxiety:    Worrying; Difficulty concentrating   Psychosis:   None   Duration of Psychotic symptoms:  Less than six months   Trauma:   None   Obsessions:   None   Compulsions:   None   Inattention:   None   Hyperactivity/Impulsivity:   None   Oppositional/Defiant Behaviors:   None   Emotional Irregularity:   None   Other Mood/Personality Symptoms:  No data recorded   Mental Status Exam Appearance and self-care  Stature:   Tall   Weight:   Overweight   Clothing:   Casual   Grooming:   Normal   Cosmetic use:   None   Posture/gait:   Normal   Motor activity:   Not Remarkable  Sensorium  Attention:   Normal   Concentration:   Normal   Orientation:   X5   Recall/memory:   Normal   Affect and Mood  Affect:   Depressed   Mood:   Depressed   Relating  Eye contact:   Normal   Facial expression:   Depressed   Attitude toward examiner:   Cooperative   Thought and Language  Speech flow:  Clear and Coherent   Thought content:   Appropriate to Mood and Circumstances   Preoccupation:   None   Hallucinations:   None   Organization:  No data recorded  Affiliated Computer ServicesExecutive Functions  Fund of Knowledge:   Fair   Intelligence:   Average   Abstraction:   Normal   Judgement:   Good   Reality Testing:   Adequate   Insight:   Fair   Decision Making:   Normal   Social Functioning  Social Maturity:    Responsible   Social Judgement:   Normal   Stress  Stressors:   Family conflict   Coping Ability:   Normal   Skill Deficits:   Intellect/education   Supports:   Family     Religion: Religion/Spirituality Are You A Religious Person?: No  Leisure/Recreation: Leisure / Recreation Do You Have Hobbies?: No  Exercise/Diet: Exercise/Diet Do You Exercise?: No Have You Gained or Lost A Significant Amount of Weight in the Past Six Months?: Yes-Gained Number of Pounds Gained:  (Unknown) Do You Follow a Special Diet?: No Do You Have Any Trouble Sleeping?: Yes Explanation of Sleeping Difficulties: Patient reports having difficulty with his sleep   CCA Employment/Education Employment/Work Situation: Employment / Work Situation Employment Situation: On disability Why is Patient on Disability: Mental Health/Autism How Long has Patient Been on Disability: Unknown Has Patient ever Been in the [employer]ancorpMilitary?: No  Education: Education Is Patient Currently Attending School?: No Did Theme park managerYou Attend College?: No Did You Have An Individualized Education Program (IIEP): No Did You Have Any Difficulty At School?: No Patient's Education Has Been Impacted by Current Illness: No   CCA Family/Childhood History Family and Relationship History: Family history Marital status: Single Does patient have children?: No  Childhood History:  Childhood History By whom was/is the patient raised?: Both parents Did patient suffer any verbal/emotional/physical/sexual abuse as a child?: No Did patient suffer from severe childhood neglect?: No Has patient ever been sexually abused/assaulted/raped as an adolescent or adult?: No Was the patient ever a victim of a crime or a disaster?: No Witnessed domestic violence?: No Has patient been affected by domestic violence as an adult?: No  Child/Adolescent Assessment:     CCA Substance Use Alcohol/Drug Use: Alcohol / Drug Use Pain Medications: See  MAR Prescriptions: See MAR Over the Counter: See MAR History of alcohol / drug use?: No history of alcohol / drug abuse                         ASAM's:  Six Dimensions of Multidimensional Assessment  Dimension 1:  Acute Intoxication and/or Withdrawal Potential:      Dimension 2:  Biomedical Conditions and Complications:      Dimension 3:  Emotional, Behavioral, or Cognitive Conditions and Complications:     Dimension 4:  Readiness to Change:     Dimension 5:  Relapse, Continued use, or Continued Problem Potential:     Dimension 6:  Recovery/Living Environment:     ASAM Severity Score:    ASAM Recommended Level  of Treatment:     Substance use Disorder (SUD)    Recommendations for Services/Supports/Treatments:    DSM5 Diagnoses: Patient Active Problem List   Diagnosis Date Noted   Acute dystonic reaction due to drugs 09/13/2020   Panic disorder 08/25/2020   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   Delusional disorder (HCC) 08/25/2020   PTSD (post-traumatic stress disorder) 07/11/2020   Major depressive disorder, recurrent episode (HCC) 07/11/2020   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/2017   ADHD (attention deficit hyperactivity disorder) 07/08/2017   Personality disorder (HCC) 07/08/2017   Insomnia 06/09/2017   Aggression 01/31/2016   Episodic mood disorder (HCC) 01/31/2016   Autism spectrum disorder associated with known medical or genetic condition or environmental factor, requiring substantial support (level 2) 11/06/2015   Neurofibromatosis, type I (von Recklinghausen's disease) (HCC) 11/06/2015   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

## 2021-02-20 DIAGNOSIS — S51812A Laceration without foreign body of left forearm, initial encounter: Secondary | ICD-10-CM | POA: Diagnosis not present

## 2021-02-20 DIAGNOSIS — F4325 Adjustment disorder with mixed disturbance of emotions and conduct: Secondary | ICD-10-CM

## 2021-02-20 LAB — SARS CORONAVIRUS 2 (TAT 6-24 HRS): SARS Coronavirus 2: NEGATIVE

## 2021-02-20 NOTE — ED Provider Notes (Signed)
The patient has been evaluated at bedside by Psych. psychiatry.  Patient is clinically stable.  Not felt to be a danger to self or others.  No SI or Hi.  No indication for inpatient psychiatric admission at this time.  Appropriate for continued outpatient therapy.    Willy Eddy, MD 02/20/21 1030

## 2021-02-20 NOTE — ED Notes (Signed)
Pt's mother will be here to pick up patient around 1130.

## 2021-02-20 NOTE — BH Assessment (Addendum)
TTS and Jamie, NP spoke with patient's mom for collateral. Mom states patient wants more freedom. Patient lives with mom and the two got into an argument and per patient "I overreacted." Mom reports they are constantly going back and forth about patient going to ECT. Mom reports she has systems in place where patient will be receiving care from psychologists, therapists, physicians and social workers. Mom reports patient will given tools for life skills, respite care and social skills. Mom believes this along with ECT will be helpful towards patient's future. Mom reports patient is allowed to return to the home and she will pick him close to lunch time.   TTS and Jamie, NP met with patient for reassessment. Patient reports he and his mom had a disagreement about his ECT appointment time. Patient reports he overreacted and wants to go back home. Patient reports he feels like he needs the ECT but does not like the process of "being put to sleep." "It's scary." Patient reports no other meds seem to be working. Patient currently denies SI/HI/AH/VH and reports he only said those things yesterday because he was upset with his mom. " I just needed a break from my mom. I do not want to kill myself." Patient was remorseful for his behavior. 

## 2021-02-20 NOTE — ED Notes (Signed)
Breakfast tray given. °

## 2021-02-20 NOTE — ED Notes (Signed)
Pt discharged home with his mother.  VS stable.  All belongings returned to patient.  Pt denies SI.

## 2021-02-20 NOTE — ED Notes (Signed)
Pt changed into behavioral scrub clothing and moved to BHU.

## 2021-02-20 NOTE — ED Provider Notes (Signed)
Emergency Medicine Observation Re-evaluation Note  Connor [name] is a 20 y.o. male, seen on rounds today.  Pt initially presented to the ED for complaints of Suicidal Currently, the patient is resting.  Physical Exam  BP 139/87 (BP Location: Right Arm)   Pulse 70   Temp 98.5 F (36.9 C) (Oral)   Resp 18   Ht 6\' 2"  (1.88 m)   Wt (!) 158.8 kg   SpO2 93%   BMI 44.94 kg/m  Physical Exam General: nad Cardiac: well perfused Lungs: unlabored Psych: currently calm  ED Course / MDM  EKG:   I have reviewed the labs performed to date as well as medications administered while in observation.  Recent changes in the last 24 hours include none.  Plan  Current plan is for psych eval.  [name] is not under involuntary commitment.     Lowella Dandy, MD 02/20/21 [phone]

## 2021-02-20 NOTE — ED Notes (Signed)
Pt dressing for discharge.  

## 2021-02-20 NOTE — Consult Note (Signed)
[facility]  02/20/2021 10:10 AM Connor [name]  MRN:  [id number]  Method of visit?: Face to Face   Principal Problem: Adjustment disorder with mixed disturbance of emotions and conduct Discharge Diagnoses: Principal Problem:   Adjustment disorder with mixed disturbance of emotions and conduct Active Problems:   Aggression   Episodic mood disorder (HCC)   Autism spectrum disorder associated with known medical or genetic condition or environmental factor, requiring substantial support (level 2)   Insomnia   ADHD (attention deficit hyperactivity disorder)   Personality disorder (HCC)   PTSD (post-traumatic stress disorder)   Major depressive disorder, recurrent episode (HCC)   Somatic symptom disorder   Subjective: "I really want to go back home.  I was really overacting."  20 yo male presented to the ED after having an argument with his mother and cutting himself, no stitches required. He stated he did this to get away from his mother as she was stressing him out and also upset about having to go to ECT so early. "The only reason I cut myself was to come here and get away from her".  He has Autism with a low threshold for frustration.  He denies suicidal/homicidal ideations, hallucinations, or paranoia.  Low level of depression, "mild".  He does want to continue with ECT, "I feel I need it, afraid of it" as he is put to sleep. Connor [name] does plan on continuing it.  Collateral information obtained from his mother:  Connor BruinDawn [name].  She reports he got upset about the early time for ECT that she tried to change.  She reports he has been doing better and was surprised he cut himself yesterday as he has never done this before.  However, she does not feel he was trying to end his life and could see he was scared after he did it, "I could see the fear in his eyes."  She is comfortable with him returning home and does not feel he is a safety risk.  Connor [name] also reports a  care team is now in progress ([facility]) to provide him and her with support--life and social skills training, respite, psychologists, therapists, etc.  Based on her information and the client's, he will be psychiatrically discharged as he is not a threat to himself or others.  Total Time spent with patient: 45 minutes  Past Psychiatric History: Autism, depression, anxiety, ADHD  Past Medical History:  Past Medical History:  Diagnosis Date   ADHD (attention deficit hyperactivity disorder)    Autism    Depressed    Neurofibromatosis (HCC)    Type 1   Obesity    Pertussis    as a infant    Past Surgical History:  Procedure Laterality Date   MRI     RADIOLOGY WITH ANESTHESIA N/A 08/13/2012   Procedure: RADIOLOGY WITH ANESTHESIA;  Surgeon: Medication Radiologist, MD;  Location: [facility] OR;  Service: Radiology;  Laterality: N/A;  MRI    Family History:  Family History  Problem Relation Age of Onset   Anxiety disorder Mother    Depression Mother    OCD Mother    Hypertension Mother    Cancer Maternal Aunt    Cancer Maternal Grandmother    Hypertension Father    Schizophrenia Maternal Uncle    Family Psychiatric  History: see above Social History:  Social History   Substance and Sexual Activity  Alcohol Use No   Alcohol/week: 0.0 standard drinks     Social History  Substance and Sexual Activity  Drug Use No    Social History   Socioeconomic History   Marital status: Single    Spouse name: Not on file   Number of children: Not on file   Years of education: Not on file   Highest education level: Not on file  Occupational History   Not on file  Tobacco Use   Smoking status: Former    Packs/day: 1.00    Years: 1.00    Pack years: 1.00    Types: Cigarettes    Quit date: 12/31/2014    Years since quitting: 6.1   Smokeless tobacco: Never  Substance and Sexual Activity   Alcohol use: No    Alcohol/week: 0.0 standard drinks   Drug use: No   Sexual activity: Not Currently     Birth control/protection: None  Other Topics Concern   Not on file  Social History Narrative   Connor [name] is a high school drop out.   He lives with his mom only. He has one sister.   He enjoys eating, sleeping, and watching tv.   Social Determinants of Health   Financial Resource Strain: Not on file  Food Insecurity: Not on file  Transportation Needs: Not on file  Physical Activity: Not on file  Stress: Not on file  Social Connections: Not on file    Tobacco Cessation:  N/A, patient does not currently use tobacco products  Current Medications: No current facility-administered medications for this encounter.   Current Outpatient Medications  Medication Sig Dispense Refill   amLODipine (NORVASC) 5 MG tablet Take 1 tablet (5 mg total) by mouth daily. 30 tablet 1   clonazePAM (KLONOPIN) 0.5 MG tablet Take 1 tablet (0.5 mg total) by mouth 3 (three) times daily as needed for anxiety. (Patient taking differently: Take 1 mg by mouth at bedtime. Can take during the day as needed) 90 tablet 1   docusate sodium (COLACE) 100 MG capsule Take 300 mg by mouth daily.     melatonin 3 MG TABS tablet Take 6 mg by mouth at bedtime.     metFORMIN (GLUCOPHAGE) 500 MG tablet Take 1,000 mg by mouth 2 (two) times daily with a meal.     OLANZapine (ZYPREXA) 20 MG tablet Take 20 mg by mouth at bedtime.     sertraline (ZOLOFT) 100 MG tablet Take 150 mg by mouth daily.     traZODone (DESYREL) 50 MG tablet Take 150 mg by mouth at bedtime.     vitamin B-12 (CYANOCOBALAMIN) 500 MCG tablet Take 1,000 mcg by mouth daily.     pantoprazole (PROTONIX) 40 MG tablet Take 1 tablet (40 mg total) by mouth daily. 30 tablet 0   PTA Medications: (Not in a hospital admission)   Musculoskeletal: Strength & Muscle Tone: within normal limits Gait & Station: normal Patient leans: N/A  Psychiatric Specialty Exam: Physical Exam Vitals and nursing note reviewed.  Constitutional:      Appearance: Normal appearance.   HENT:     Head: Normocephalic.     Nose: Nose normal.  Pulmonary:     Effort: Pulmonary effort is normal.  Musculoskeletal:        General: Normal range of motion.     Cervical back: Normal range of motion.  Neurological:     General: No focal deficit present.     Mental Status: He is alert and oriented to person, place, and time.  Psychiatric:        Attention and Perception: Attention and  perception normal.        Mood and Affect: Mood is depressed.        Speech: Speech normal.        Behavior: Behavior normal. Behavior is cooperative.        Thought Content: Thought content normal.        Cognition and Memory: Cognition is impaired.        Judgment: Judgment is impulsive.    Review of Systems  Psychiatric/Behavioral:  Positive for depression.   All other systems reviewed and are negative.  Blood pressure 137/88, pulse 80, temperature 98.3 F (36.8 C), temperature source Oral, resp. rate 18, height 6\' 2"  (1.88 m), weight (!) 158.8 kg, SpO2 98 %.Body mass index is 44.94 kg/m.  General Appearance: Casual  Eye Contact:  Fair  Speech:  Clear and Coherent  Volume:  Normal  Mood:  Depressed  Affect:  Congruent  Thought Process:  Coherent and Descriptions of Associations: Intact  Orientation:  Full (Time, Place, and Person)  Thought Content:  WDL and Logical  Suicidal Thoughts:  No  Homicidal Thoughts:  No  Memory:  Immediate;   Good Recent;   Good Remote;   Good  Judgement:  Fair  Insight:  Fair  Psychomotor Activity:  Normal  Concentration:  Concentration: Fair and Attention Span: Fair  Recall:  Good  Fund of Knowledge:  Fair  Language:  Good  Akathisia:  No  Handed:  Right  AIMS (if indicated):     Assets:  Housing Leisure Time Physical Health Resilience Social Support  ADL's:  Intact  Cognition:  WNL  Sleep:        Physical Exam: Physical Exam Vitals and nursing note reviewed.  Constitutional:      Appearance: Normal appearance.  HENT:     Head:  Normocephalic.     Nose: Nose normal.  Pulmonary:     Effort: Pulmonary effort is normal.  Musculoskeletal:        General: Normal range of motion.     Cervical back: Normal range of motion.  Neurological:     General: No focal deficit present.     Mental Status: He is alert and oriented to person, place, and time.  Psychiatric:        Attention and Perception: Attention and perception normal.        Mood and Affect: Mood is depressed.        Speech: Speech normal.        Behavior: Behavior normal. Behavior is cooperative.        Thought Content: Thought content normal.        Cognition and Memory: Cognition is impaired.        Judgment: Judgment is impulsive.   Review of Systems  Psychiatric/Behavioral:  Positive for depression.   All other systems reviewed and are negative. Blood pressure 137/88, pulse 80, temperature 98.3 F (36.8 C), temperature source Oral, resp. rate 18, height 6\' 2"  (1.88 m), weight (!) 158.8 kg, SpO2 98 %. Body mass index is 44.94 kg/m.   Demographic Factors:  Male, Adolescent or young adult, and Caucasian  Loss Factors: NA  Historical Factors: Impulsivity  Risk Reduction Factors:   Sense of responsibility to family, Living with another person, especially a relative, Positive social support, and Positive therapeutic relationship  Continued Clinical Symptoms:  Depression, mild  Cognitive Features That Contribute To Risk:  None    Suicide Risk:  Minimal: No identifiable suicidal ideation.  Patients presenting with  no risk factors but with morbid ruminations; may be classified as minimal risk based on the severity of the depressive symptoms    Plan Of Care/Follow-up recommendations:  Adjustment disorder with mixed disturbance of emotions and conduct: -Continue Zyprexa 20 mg at bedtime -Continue Zoloft 100 mg daily  Insomnia: -Continue Trazodone 50 mg at bedtime  Anxiety: -Continue Klonopin 0.5 mg TID PRN Activity:  as tolerated Diet:   heart healthy diet  Disposition: discharge home Nanine Means, NP 02/20/2021, 10:10 AM

## 2021-02-20 NOTE — ED Notes (Signed)
Pt resting in this bed at this time watching tv. Pt has been introduced to the BHU area. Pt knows where bathroom is what he needs to do if he needs anything

## 2021-07-12 IMAGING — MR MR HEAD W/O CM
10 series · 38 of 48 positions shown · non-contrast
Comparison: Previous brain MRI from 08/13/2012.

CLINICAL DATA: Initial evaluation for acute psychosis.

EXAM:
MRI HEAD WITHOUT CONTRAST
TECHNIQUE: Multiplanar, multiecho pulse sequences of the brain and surrounding
structures were obtained without intravenous contrast.

[Series 5: ax dwi_tracew · axial · 3.0mm · 0.65mm/px · z∈[-103,+56]mm · 5 of 50 slices shown]
[im 1/50]
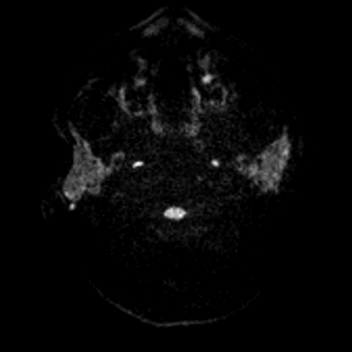
[im 13/50]
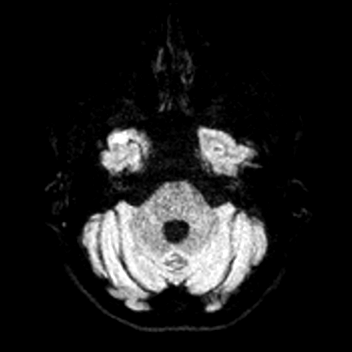
[im 25/50]
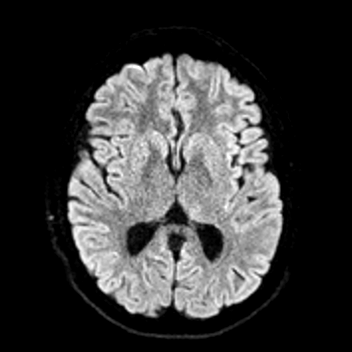
[im 37/50]
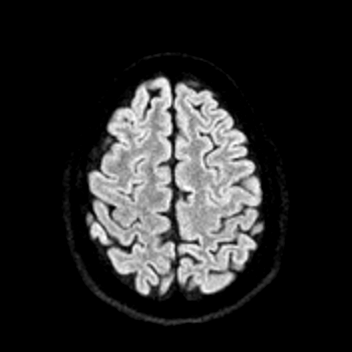
[im 50/50]
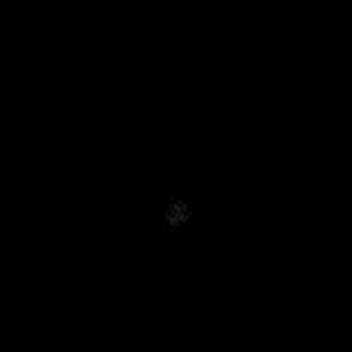

[Series 6: ax dwi_adc · axial · 3.0mm · 0.65mm/px · z∈[-103,+52]mm · 4 of 49 slices shown]
[im 1/49]
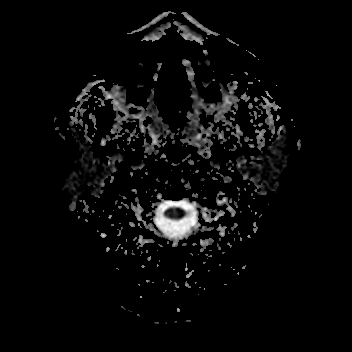
[im 17/49]
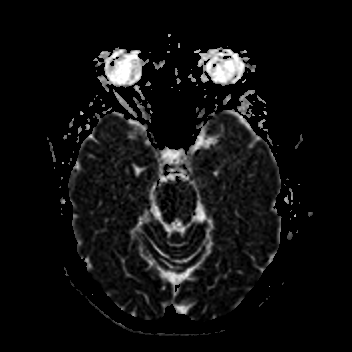
[im 33/49]
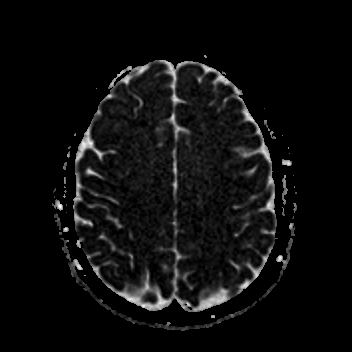
[im 49/49]
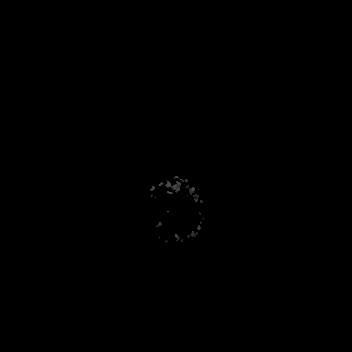

[Series 7: cor dwi_tracew · coronal · 5.0mm · 0.65mm/px · 4 of 40 slices shown]
[im 1/40]
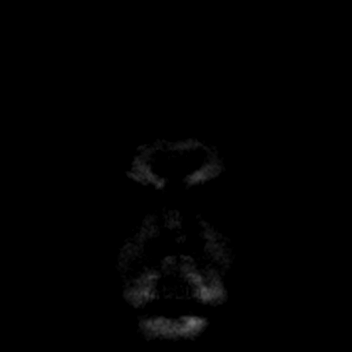
[im 14/40]
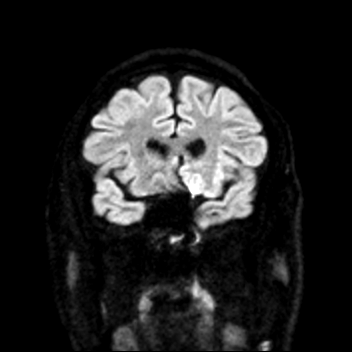
[im 27/40]
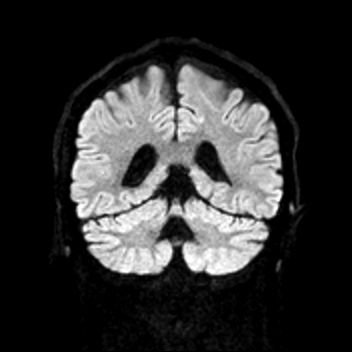
[im 40/40]
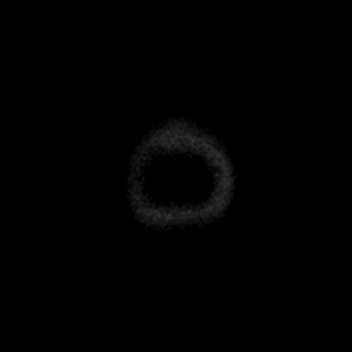

[Series 8: cor dwi_adc · coronal · 5.0mm · 0.65mm/px · 4 of 40 slices shown]
[im 1/40]
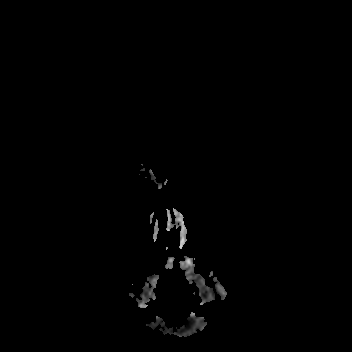
[im 14/40]
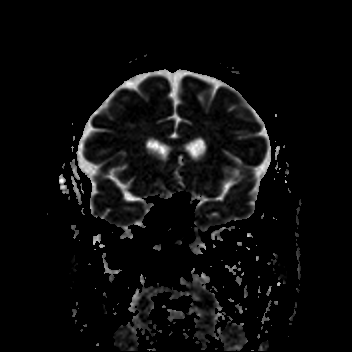
[im 27/40]
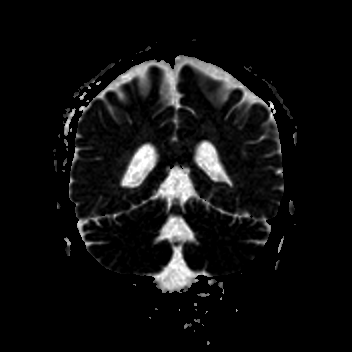
[im 40/40]
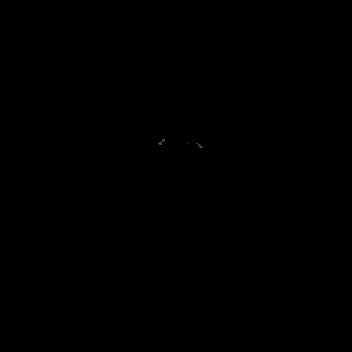

[Series 9: T1 · sagittal · 5.0mm · 0.94mm/px · 2 of 25 slices shown (1 of 2)]
[im 1/25]
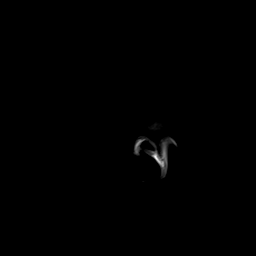
[im 25/25]
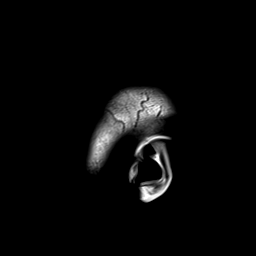

[Series 10: T2 · axial · 5.0mm · 0.53mm/px · z∈[-100,+54]mm · 2 of 27 slices shown (1 of 2)]
[im 1/27]
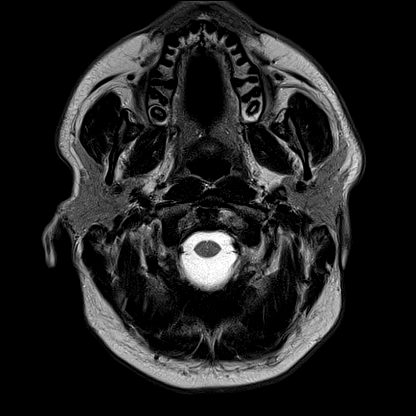
[im 27/27]
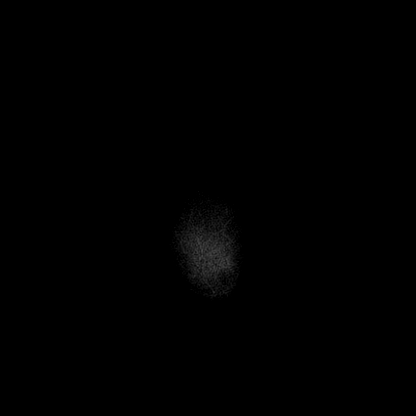

[Series 11: T2-star · axial · 5.0mm · 0.45mm/px · 1 of 28 slices shown]
[im 1/28]
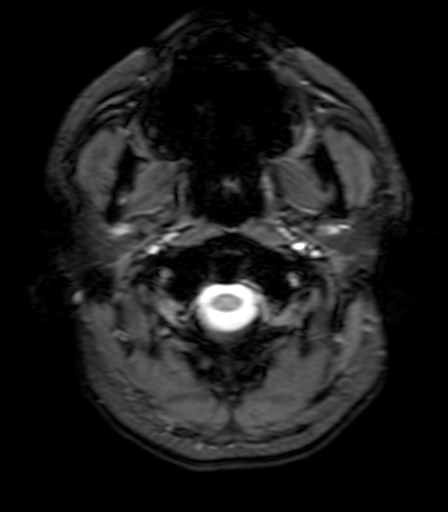

[Series 12: FLAIR · axial · 3.0mm · 0.53mm/px · z∈[-103,+57]mm · 5 of 55 slices shown]
[im 1/55]
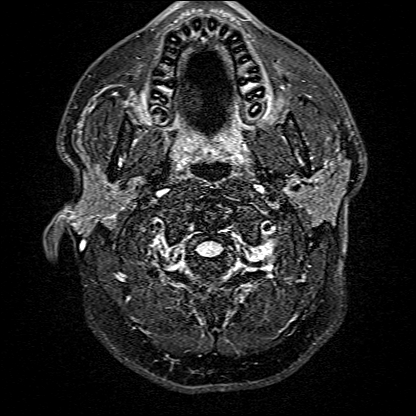
[im 14/55]
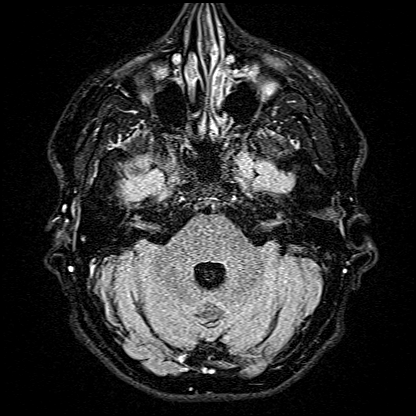
[im 28/55]
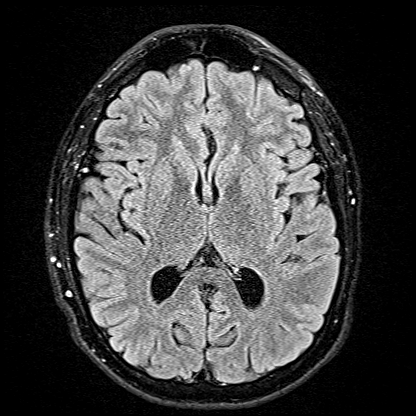
[im 41/55]
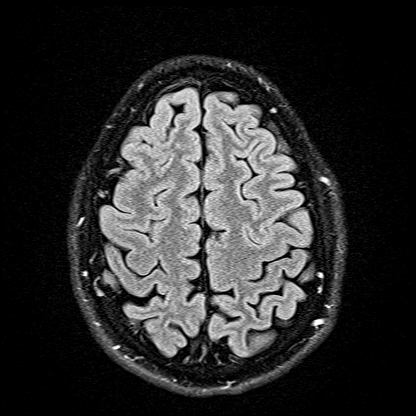
[im 55/55]
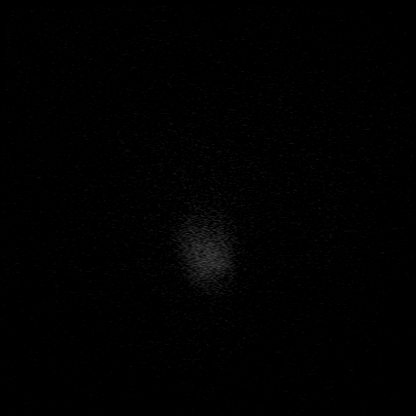

[Series 13: T1 · axial · 1.0mm · 0.98mm/px · z∈[-101,+60]mm · 8 of 176 slices shown (2 of 2)]
[im 12/176]
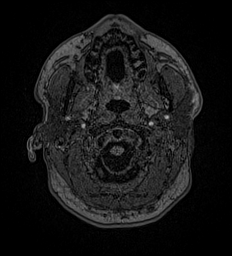
[im 36/176]
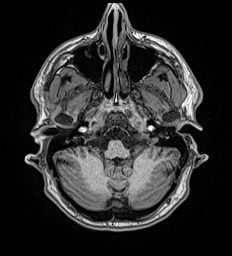
[im 59/176]
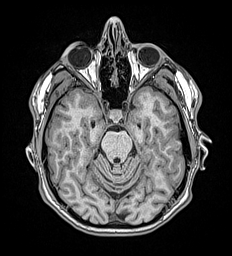
[im 82/176]
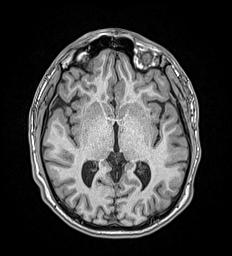
[im 106/176]
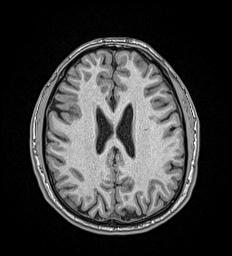
[im 129/176]
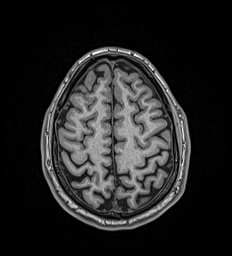
[im 152/176]
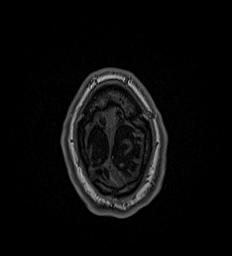
[im 176/176]
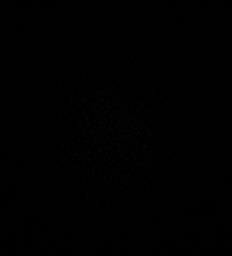

[Series 14: T2 · coronal · 5.0mm · 0.45mm/px · 3 of 33 slices shown (2 of 2)]
[im 1/33]
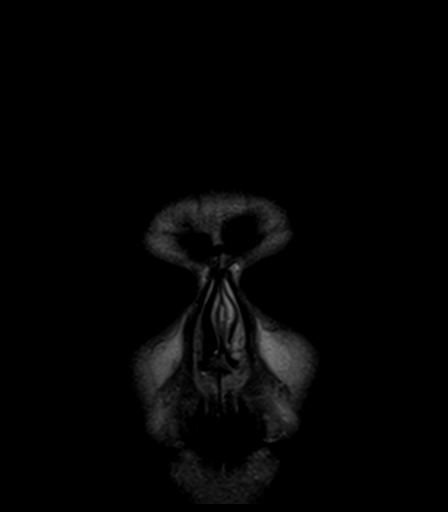
[im 17/33]
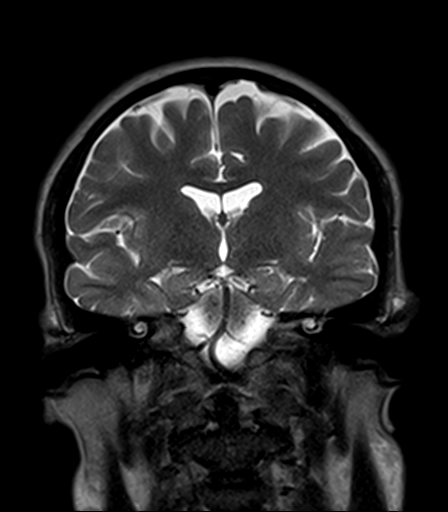
[im 33/33]
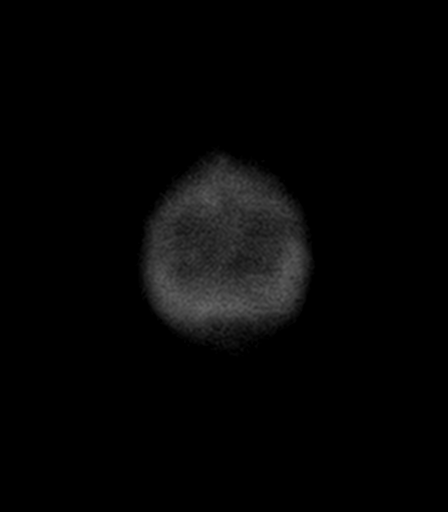

[38 of 48 positions shown; findings below may reference images not displayed]

FINDINGS: Brain: Cerebral volume within normal limits for age. A few scattered
punctate foci of FLAIR hyperintensity noted within the
periventricular white matter of both frontal lobes, nonspecific, but
felt to be within normal limits for age and of doubtful
significance. Suspected small left frontal DVA noted as well.

No abnormal foci of restricted diffusion to suggest acute or
subacute ischemia. Gray-white matter differentiation maintained. No
encephalomalacia to suggest chronic cortical infarction. No foci of
susceptibility artifact to suggest acute or chronic intracranial
hemorrhage.

No mass lesion, midline shift or mass effect. Ventricles normal size
without hydrocephalus. No extra-axial fluid collection. Pituitary
gland suprasellar region within normal limits. Midline structures
intact and normal.

Vascular: Major intracranial vascular flow voids are well maintained
and normal in appearance.

Skull and upper cervical spine: Craniocervical junction normal. Bone
marrow signal intensity within normal limits. No focal marrow
replacing lesion. No scalp soft tissue abnormality.

Sinuses/Orbits: Globes and orbital soft tissues within normal
limits. Mild scattered mucosal thickening noted within the ethmoidal
air cells. Paranasal sinuses are otherwise largely clear. No
significant mastoid effusion. Inner ear structures within normal
limits.

Other: None.
IMPRESSION: Normal brain MRI for age. No acute intracranial abnormality
identified.
